# Patient Record
Sex: Female | Born: 1945 | Race: White | Hispanic: No | Marital: Single | Smoking: Former smoker
Health system: Southern US, Community
[De-identification: ages and names within clinical notes are randomized; demographics above are authoritative.]

## PROBLEM LIST (undated history)

## (undated) DIAGNOSIS — F329 Major depressive disorder, single episode, unspecified: Secondary | ICD-10-CM

## (undated) DIAGNOSIS — F32A Depression, unspecified: Secondary | ICD-10-CM

## (undated) DIAGNOSIS — J189 Pneumonia, unspecified organism: Secondary | ICD-10-CM

## (undated) DIAGNOSIS — G4730 Sleep apnea, unspecified: Secondary | ICD-10-CM

## (undated) DIAGNOSIS — I739 Peripheral vascular disease, unspecified: Secondary | ICD-10-CM

## (undated) DIAGNOSIS — C801 Malignant (primary) neoplasm, unspecified: Secondary | ICD-10-CM

## (undated) DIAGNOSIS — I639 Cerebral infarction, unspecified: Secondary | ICD-10-CM

## (undated) DIAGNOSIS — I10 Essential (primary) hypertension: Secondary | ICD-10-CM

## (undated) HISTORY — DX: Cerebral infarction, unspecified: I63.9

## (undated) HISTORY — DX: Essential (primary) hypertension: I10

---

## 1998-01-01 ENCOUNTER — Other Ambulatory Visit: Admission: RE | Admit: 1998-01-01 | Discharge: 1998-01-01 | Payer: Self-pay | Admitting: Obstetrics and Gynecology

## 1999-01-04 ENCOUNTER — Other Ambulatory Visit: Admission: RE | Admit: 1999-01-04 | Discharge: 1999-01-04 | Payer: Self-pay | Admitting: Obstetrics and Gynecology

## 2000-01-05 ENCOUNTER — Other Ambulatory Visit: Admission: RE | Admit: 2000-01-05 | Discharge: 2000-01-05 | Payer: Self-pay | Admitting: Obstetrics and Gynecology

## 2001-01-08 ENCOUNTER — Other Ambulatory Visit: Admission: RE | Admit: 2001-01-08 | Discharge: 2001-01-08 | Payer: Self-pay | Admitting: Obstetrics and Gynecology

## 2001-04-25 ENCOUNTER — Ambulatory Visit (HOSPITAL_COMMUNITY): Admission: RE | Admit: 2001-04-25 | Discharge: 2001-04-25 | Payer: Self-pay | Admitting: Gastroenterology

## 2001-04-25 ENCOUNTER — Encounter (INDEPENDENT_AMBULATORY_CARE_PROVIDER_SITE_OTHER): Payer: Self-pay | Admitting: Specialist

## 2001-06-28 ENCOUNTER — Ambulatory Visit: Admission: RE | Admit: 2001-06-28 | Discharge: 2001-06-28 | Payer: Self-pay | Admitting: Internal Medicine

## 2001-10-22 ENCOUNTER — Encounter: Payer: Self-pay | Admitting: Internal Medicine

## 2001-10-22 ENCOUNTER — Inpatient Hospital Stay (HOSPITAL_COMMUNITY): Admission: AD | Admit: 2001-10-22 | Discharge: 2001-10-23 | Payer: Self-pay | Admitting: Internal Medicine

## 2002-04-29 ENCOUNTER — Other Ambulatory Visit: Admission: RE | Admit: 2002-04-29 | Discharge: 2002-04-29 | Payer: Self-pay | Admitting: Obstetrics and Gynecology

## 2006-09-21 ENCOUNTER — Encounter: Admission: RE | Admit: 2006-09-21 | Discharge: 2006-09-21 | Payer: Self-pay | Admitting: Chiropractic Medicine

## 2006-09-28 ENCOUNTER — Encounter: Admission: RE | Admit: 2006-09-28 | Discharge: 2006-09-28 | Payer: Self-pay | Admitting: Internal Medicine

## 2009-09-02 ENCOUNTER — Ambulatory Visit: Payer: Self-pay | Admitting: Vascular Surgery

## 2009-09-07 ENCOUNTER — Ambulatory Visit (HOSPITAL_COMMUNITY): Admission: RE | Admit: 2009-09-07 | Discharge: 2009-09-07 | Payer: Self-pay | Admitting: Vascular Surgery

## 2009-09-07 ENCOUNTER — Ambulatory Visit: Payer: Self-pay | Admitting: Vascular Surgery

## 2009-10-07 ENCOUNTER — Ambulatory Visit: Payer: Self-pay | Admitting: Vascular Surgery

## 2010-04-02 LAB — POCT I-STAT, CHEM 8
BUN: 19 mg/dL (ref 6–23)
Calcium, Ion: 1.1 mmol/L — ABNORMAL LOW (ref 1.12–1.32)
Chloride: 102 mEq/L (ref 96–112)
Creatinine, Ser: 1.4 mg/dL — ABNORMAL HIGH (ref 0.4–1.2)
Glucose, Bld: 108 mg/dL — ABNORMAL HIGH (ref 70–99)
HCT: 38 % (ref 36.0–46.0)
Hemoglobin: 12.9 g/dL (ref 12.0–15.0)
Potassium: 2.8 mEq/L — ABNORMAL LOW (ref 3.5–5.1)
Sodium: 136 mEq/L (ref 135–145)
TCO2: 24 mmol/L (ref 0–100)

## 2010-04-14 ENCOUNTER — Encounter (INDEPENDENT_AMBULATORY_CARE_PROVIDER_SITE_OTHER): Payer: Medicare Other

## 2010-04-14 ENCOUNTER — Ambulatory Visit (INDEPENDENT_AMBULATORY_CARE_PROVIDER_SITE_OTHER): Payer: Medicare Other | Admitting: Vascular Surgery

## 2010-04-14 DIAGNOSIS — I70219 Atherosclerosis of native arteries of extremities with intermittent claudication, unspecified extremity: Secondary | ICD-10-CM

## 2010-04-14 DIAGNOSIS — I739 Peripheral vascular disease, unspecified: Secondary | ICD-10-CM

## 2010-04-15 NOTE — Assessment & Plan Note (Signed)
OFFICE VISIT  [name], [name] DOB:  1945/04/29                                       04/14/2010 [id number]#:[id number]  I saw this patient in the office today for continued follow-up of her peripheral vascular disease.  I had last seen her in September 2011. 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  The left iliac system is open but small.  She had no significant infrainguinal arterial occlusive disease.  We discussed the importance of tobacco cessation and structured walking program and she comes in for a routine follow-up visit.  Since I saw her last she is just back from [place] where she was there for 2 months.  She did a lot of walking in [place] and feels her symptoms have actually improved.  She experiences claudication mostly in the thighs, more so on the right side and to a lesser degree in the calves.  Her  symptoms were brought on by ambulation and relieved with rest.  There are no other aggravating or alleviating factors.  She has had no rest pain and no history of nonhealing wounds.  SOCIAL HISTORY:  She is single.  She does not have any children.  She is retired.  She smokes a pack per day of cigarettes and has been smoking for over 40 years.  She had cut back briefly to a half a pack per day but is now back to a pack.  REVIEW OF SYSTEMS:  CARDIOVASCULAR:  She has had no chest pain, chest pressure, palpitations or arrhythmias. PULMONARY:  She had no productive cough, bronchitis, asthma or wheezing.  PHYSICAL EXAMINATION:  General;  This is a pleasant 64-hour woman who appears her stated age.  Vital signs:  Blood pressure 156/77, heart rate is 74, saturation 99%.  Lungs:  Clear bilaterally to auscultation without rales, rhonchi or wheezing.  Cardiovascular exam:  I do not detect any carotid bruits.  She has a regular rate and rhythm.  I cannot palpate a  right femoral pulse.  She has a  diminished left femoral pulse.  She does have a palpable left dorsalis pedis pulse.  I cannot palpate pedal pulses on the right.  Neurologic exam: She has no focal weakness or paresthesias.  Skin:  There are no ulcers or rashes.  I have independently interpreted arterial Doppler study which shows monophasic Doppler signals on the right with an ABI of 56% which is stable compared to her study last year.  On the left side she has a biphasic posterior tibial and dorsalis pedis signal with an ABI of 99%.  I have ordered a follow-up Doppler study in 1 year and I will see her back at that time.  I have encouraged to stay as active as possible and to try do as much walking as possible.  We have again discussed the importance of tobacco cessation.  She knows to call sooner if she has problems.    Di Kindle. Edilia Bo, M.D. Electronically Signed  CSD/MEDQ  D:  04/14/2010  T:  04/15/2010  Job:  [id number]  cc:   Gwen Pounds, MD

## 2010-06-01 NOTE — Consult Note (Signed)
NEW PATIENT CONSULTATION   [name], [name]  DOB:  10/21/1945                                       09/02/2009  [id number]#:[id number]   I saw patient in the office today in consultation concerning her leg  pain.  She was referred by Dr. Timothy Lasso.   This is a pleasant 65 year old woman who began having pain in both legs  in June when she was on a trip to [place].  She noticed pain in the  thigh.  It was brought on by ambulation and relieved with rest.  These  symptoms have been stable since then.  There are no other aggravating or  alleviating factors.  She has no significant calf claudication, and she  denies any history of rest pain or nonhealing ulcers.  She denies any  significant back pain.  Of note, she has been on Lipitor and recently  stopped this.  Does feel that this did help her pain somewhat.  She does  not remember any acute onset of the pain, but it was more gradual in  onset.   As part of the workup for this, she was evaluated by Dr. Timothy Lasso and had a  lower extremity arterial duplex and abdominal aortic duplex at [facility], which I have reviewed.  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  Her ABI  on the right was 60% and on the left 90%.   Her past medical history is significant for hypertension and  hypercholesterolemia, both of which are followed closely by Dr. Timothy Lasso.  She denies any history of diabetes, history of previous myocardial  infarction, history of congestive heart failure, or history of COPD.   SOCIAL HISTORY:  She is single.  She does not have any children.  She is  retired.  She smokes a pack per day of cigarettes and has been smoking  for 40 years.   FAMILY HISTORY:  Her mother had colon cancer.  Brother had prostate  cancer.  Her father had a stroke at age 30.  She is unaware of any  history of  premature cardiovascular disease.   REVIEW OF SYSTEMS:  GENERAL:  She has had no recent weight loss, weight  gain, or problems with her appetite.  CARDIOVASCULAR:  She has had no chest pain, chest pressure, palpitations  or arrhythmias.  She has no significant dyspnea on exertion.  She has  had no history of stroke, TIAs, or amaurosis fugax.  She has had no  history of DVT or phlebitis.  PULMONARY:  She has had bronchitis in the past.  PSYCHIATRIC:  She has had depression in the past.  GI, neurologic, hematologic, GU, ENT, musculoskeletal, integumentary  review of systems is unremarkable, as documented on the medical history  form in her chart.   PHYSICAL EXAMINATION:  This is a pleasant 65 year old woman who appears  her stated age.  Blood pressure is 119/71.  Heart rate is 75, saturation  99%.  HEENT:  Unremarkable.  Lungs are clear bilaterally to auscultation  without rales, rhonchi or wheezing.  On cardiovascular exam, she has an  irregular rate and rhythm.  I do not detect any carotid bruits.  I  cannot palpate a right femoral pulse.  I  cannot palpate a right  popliteal or pedal pulses on the right.  On the left side, she has a  palpable femoral, popliteal, dorsalis pedis, and posterior tibial pulse.  She has no significant lower extremity swelling.  Abdomen is soft and  nontender with normal-pitched bowel sounds.  No masses are appreciated.  I cannot appreciate an abdominal aortic aneurysm.  Musculoskeletal exam  has no major deformities or cyanosis.  Neurologic:  She has no focal  weakness or paresthesias.  Skin:  There are no ulcers or rashes.   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  We have discussed the  importance of tobacco cessation in the office today, and I have given  her the number for [facility] free program.  We have also discussed the  importance of a structured walking program.  We also  discussed the  option of proceeding with arteriography to see if she has any disease  amenable to angioplasty.  She would like to proceed with arteriography  to further evaluate this blockage, and we have reviewed the indications  for arteriography and the potential complications, including but not  limited to bleeding, arterial injury, and renal insufficiency.  We have  also discussed the indications for possible angioplasty and stenting and  the potential complications including but not limited to bleeding,  arterial injury or arterial thrombosis.  All of her questions were  answered, and she is agreeable to proceed.  The procedure has been  scheduled for 09/07/2009.  We will make further recommendations pending  these results.     Di Kindle. Edilia Bo, M.D.  Electronically Signed   CSD/MEDQ  D:  09/02/2009  T:  09/02/2009  Job:  [id number]   cc:   Gwen Pounds, MD

## 2010-06-01 NOTE — Assessment & Plan Note (Signed)
OFFICE VISIT   [name], [name]  DOB:  [birth year]                                       10/07/2009  EAVWU#:[id number]   I saw the patient in the office today for continued followup of her  peripheral vascular disease.  I had initially seen her in consultation  on September 02, 2009.  She had evidence of iliac artery occlusive disease  on the right and underwent a diagnostic arteriogram on September 07, 2009.  This showed a long-segment occlusion of her right external iliac artery  with diffuse calcific disease.  She was not a candidate for endovascular  intervention on the right.  Her iliac system on the left was patent  although the artery was somewhat small.  She comes in for a followup  visit.  She continues to have claudication in the right leg that mostly  involves her right thigh.  This is brought on by ambulation and relieved  with rest.  She has had no rest pain and no history of nonhealing  ulcers.  She has some buttock pain on the left although this sounds to  be unrelated to her peripheral vascular disease and may simply be  bursitis.   REVIEW OF SYSTEMS:  CARDIOVASCULAR:  She has had no chest pain, chest  pressure, palpitations or arrhythmias.   PHYSICAL EXAMINATION:  This is a pleasant 65 year old woman who appears  her stated age.  Blood pressure is 136/78, heart rate is 75, respiratory  rate is 16.  Lungs are clear bilaterally to auscultation.  Cardiovascular exam:  She has a regular rate and rhythm.  I cannot  palpate a right femoral pulse.  She has a palpable left femoral pulse.  There are no palpable pulses on the right, although the right foot  appears adequately perfused.  On the left side she has palpable pedal  pulses.   I reviewed the results of her arteriogram and we discussed the option of  conservative treatment with a structured walking program and tobacco  cessation versus considering left-to-right fem-fem bypass graft.  I  would favor conservative therapy for now unless her symptoms progress  significantly.  She agrees with that assessment and I will plan on  seeing her back in 6 months.  I have ordered followup ABIs at that time.  She knows to call sooner if she has problems.     Di Kindle. Edilia Bo, M.D.  Electronically Signed   CSD/MEDQ  D:  10/07/2009  T:  10/08/2009  Job:  [id number]   cc:   Gwen Pounds, MD

## 2010-06-04 NOTE — Discharge Summary (Signed)
   NAME:  [name], [name]                     ACCOUNT NO.:  [id number]   MEDICAL RECORD NO.:  [id number]                   PATIENT TYPE:  INP   LOCATION:  5530                                 FACILITY:  [facility]   PHYSICIAN:  Gwen Pounds, M.D.                 DATE OF BIRTH:  30-Oct-1945   DATE OF ADMISSION:  10/22/2001  DATE OF DISCHARGE:  10/23/2001                                 DISCHARGE SUMMARY   DISCHARGE DIAGNOSES:  1. Chronic obstructive pulmonary disease exacerbation.  2. Intractable cough.  3. Hypertension.  4. Hyperlipidemia.  5. Tobacco abuse.  6. Former alcoholic.   DISCHARGE MEDICATIONS:  Discharge medications include home medications plus:  1. Tequin 400 mg p.o. every day x14 days.  2. Prednisone taper.  3. Atrovent as needed.   PROCEDURE:  Chest x-ray, which showed no evidence of pneumonia.   HISTORY OF PRESENT ILLNESS:  Briefly, a 65 year old white female that  smokes, with underlying COPD, presented to medical attention on October 22, 2001 after failing outpatient treatment for COPD exacerbation with  prednisone and Z-Pak. In the office, she had fever, rib pain, cough, sinus  congestion and shortness of breath, saturating 80-90%, and it was felt that  she needed to be admitted for further tune-up.   HOSPITAL COURSE:  She was given IV Solu-Medrol, given IV Tequin; both of  these were converted over to oral prior to discharge.  She was given  nebulizer treatments, given oxygen and she improved overnight to the point  where she can go back home and finish her outpatient course with Tequin and  much longer tapering prednisone.   The patient can be discharged on October 23, 2001 in stable condition.   AFTER-CARE FOLLOWUP INSTRUCTIONS:  She will follow up with me next Monday as  scheduled and she is strongly encouraged to quit tobacco.                                               Gwen Pounds, M.D.    JMR/MEDQ  D:  10/23/2001  T:  10/26/2001  Job:   [id number]

## 2010-06-04 NOTE — Procedures (Signed)
[facility]  Patient:    [name], [name] Visit Number: [id number] MRN: [id number]          Service Type: END Location: ENDO Attending Physician:  Rich Brave Dictated by:   Florencia Reasons, M.D. Proc. Date: 04/25/01 Admit Date:  04/25/2001   CC:         Gwen Pounds, M.D.   Procedure Report  PROCEDURE:  Colonoscopy with biopsies.  INDICATION:  A 65 year old female for follow-up of prior history of adenomatous polyps, and a family history of colon cancer in her mother diagnosed around age 73, with the patients most recent surveillance colonoscopy, five and a half years ago, having shown just a small adenomatous polyp.  FINDINGS:  Several small polyps removed.  DESCRIPTION OF PROCEDURE:  The nature, purpose, and risks of the procedure were familiar to the patient from prior examinations, and she provided written consent.  Sedation was begun with a bolus of 5 mg of Versed in view of a known previous fairly heavy sedation requirement in this patient.  After that, we touched her up to a total of fentanyl 75 mcg and Versed 7 mg during the course of the procedure without arrhythmias, desaturation, or other problems.  The level of sedation was good, and the patient was comfortable throughout.  The Olympus adjustable-tension pediatric video colonoscope was advanced through a spastic sigmoid region quite easily around the colon to the cecum, this identified by clear visualization of the appendiceal orifice, after which pullback was performed.  A total of approximately 4-5 small polyps were removed during this exam, each by 1-2 cold biopsies, and the largest of these was probably 3 mm across.  All of the polyps were small, sessile, and hyperplastic in appearance.  The first was in the cecum; then there were a couple in roughly 30 cm and 40 cm, and then one at 15 cm and one in the distal rectum.  There was good hemostasis at the biopsy  sites.  No large polyps, cancer, colitis, vascular malformations, or diverticular disease were appreciated.  The quality of the prep was quite good, so it is felt that all areas were adequately seen.  Retroflexion was not performed in the rectum due to the proximity of the rectal polyp biopsies.  Reinspection of the rectosigmoid and careful antegrade inspection of the distal rectum were unremarkable.  The patient tolerated the procedure well, and there were no apparent complications.  IMPRESSION:  Approximately five small sessile polyps removed as described above.  PLAN:  Await pathology. Dictated by:   Florencia Reasons, M.D. Attending Physician:  Rich Brave DD:  04/25/01 TD:  04/25/01 Job: [id number]

## 2010-06-04 NOTE — H&P (Signed)
NAME:  [name], [name] (FLASH) F             ACCOUNT NO.:  [id number]   MEDICAL RECORD NO.:  [id number]                   PATIENT TYPE:  INP   LOCATION:  5530                                 FACILITY:  [facility]   PHYSICIAN:  Gwen Pounds, M.D.                 DATE OF BIRTH:  18-Mar-1945   DATE OF ADMISSION:  10/22/2001  DATE OF DISCHARGE:                                HISTORY & PHYSICAL   PULMONOLOGIST:  Clinton D. Young, M.D.   CHIEF COMPLAINT:  Cough, shortness of breath, and fever.   HISTORY OF PRESENT ILLNESS:  This is a 65 year old female with hypertension,  hyperlipidemia, positive tobacco being admitted with two weeks of COPD  symptoms unresponsive to prednisone and Z-pack.  She is now with fever, rib  pain, cough, sinus congestion and shortness of breath.  Saturations are in  the upper 80s and 90s on room air.  Will admit for further evaluation and  treatment.   PAST MEDICAL HISTORY:  1. Hypertension.  2. Hyperlipidemia.  3. Significant tobacco.  4. Former alcoholic.  5. COPD with FEV1 to FVC ratio of 69% and FEF 2575 of 44%.  These tests were     done on spirometry dated June 28, 2001, and diagnosed with mild to     moderate obstructive lung disease.  6. She had breast surgery and nipple resection.  7. She had history of broken ribs secondary to pneumonitis.  8. She had polio as a child.  9. Adenomatous polypectomy.   ALLERGIES:  BETA-BLOCKERS and PERCOCET.   MEDICATIONS:  1. Teveten 600 q.d.  2. Clonidine 0.1 two q.h.s.  3. Pravachol 40 q.h.s.  4. Sonata 10 one to two q.h.s.  5. Lexapro 10 q.d.  6. Hydrochlorothiazide 12.5 q.d.   SOCIAL HISTORY:  She is single with no children.  She is a retired Runner, broadcasting/film/video.  She smokes one pack per day x36 years.  She is no longer drinking alcohol.  Her health care power of attorney is Benay Spice.   FAMILY HISTORY:  CVA, colon cancer, hypertension.   REVIEW OF SYSTEMS:  She has been having fevers, been having chills,  been  having night sweats.  Her weight is a little bit lower.  No lymphadenopathy.  Positive headaches.  Positive sinus issues.  Mild photophobia. No skin  lesions.  No chest pain.  No resting shortness of breath, although becomes  dyspnea on exertion.  She denies orthopnea.  No PND, no edema. No  genitourinary symptoms.  No neuropsychiatric symptoms currently.  No  musculoskeletal symptoms.  Denies nausea, vomiting, diarrhea, blood in  stool, and all other organ systems reviewed negative.  She did have a chest  x-ray on October 16, 2001, which was negative.   PHYSICAL EXAMINATION:  GENERAL APPEARANCE:  Alert and oriented x3.  VITAL SIGNS:  Temperature 99.9, pulse 96, weight 122, 89 to 90% saturations  on room air, blood pressure 134/88.  HEENT:  Sinus pressure noted. Pupils  are equal, round and reactive to light.  Extraocular movements intact.  Oropharynx is dry.  NECK:  Supple without lymphadenopathy.  LUNGS:  Clear except for diffuse wheezing.  CARDIOVASCULAR:  Regular without murmurs.  SKIN:  No rashes.  ABDOMEN:  Soft and nontender.  EXTREMITIES:  No clubbing, cyanosis, or edema.  NEUROLOGIC:  Grossly intact.   ASSESSMENT:  This is a 65 year old female with history of emphysema being  admitted with chronic obstructive pulmonary disease exacerbation, rule out  pneumonia.  She also has hypoxia.   PLAN:  1. Admit.  2. Oxygen, antibiotics, nebulizer and steroids.  3. Cough suppression.  4. Chest x-ray PA and lateral.  5. Questionable rib fracture.  6. Check labs.  7. Control blood pressure.   Of note, she has had past work-up for her emphysema includes CT scan by the  national lung screen trial.  She did have mild emphysema in the upper lobes  on April 23, 2001.  She has had three spirometry examinations, the first one  was in November 1996 which showed an FVC of 2.54 and FEV1 of 1.94, ratio was  at 76%.  It was repeated on April 20, 1995, and there really was no change   with an FVC of 2.53 and FEV1 of 1.97, again the ratio was 78%.  When I  rechecked in on June 28, 2001, her FVC had gone up to 2.68 and her FEV1 had  gone down slightly to 1.86 for a ratio of 69%.  Where she had a lot of  trouble with though is when we performed DLCO where she was significantly  reduced at 53%.  She also had a marked decrease in her FEF 25-75% time at  1.04 liters per second which is 44% of predicted.  She had no change in any  of her parameters of methylcholine. The official read on the June 28, 2001,  was  there was a moderate obstructive lung defect. The airway obstruction is  confirmed by the decrease in flow rate of 25%, 50% and 75% of the flow  volume curve.  Lung volumes ware within normal limits.  There is a moderate  decrease in diffusing capacity.  This is interpreted as an insignificant  response to the bronchodilator.                                                Gwen Pounds, M.D.    JMR/MEDQ  D:  10/22/2001  T:  10/24/2001  Job:  [id number]   cc:   Joni Fears D. Young, M.D.  [address] [place]  [place] [address]  Fax: [phone]

## 2010-08-03 ENCOUNTER — Other Ambulatory Visit: Payer: Self-pay | Admitting: Dermatology

## 2011-03-14 ENCOUNTER — Other Ambulatory Visit: Payer: Self-pay | Admitting: Gastroenterology

## 2011-03-24 ENCOUNTER — Other Ambulatory Visit: Payer: Self-pay | Admitting: Dermatology

## 2011-05-06 ENCOUNTER — Ambulatory Visit (INDEPENDENT_AMBULATORY_CARE_PROVIDER_SITE_OTHER): Payer: Medicare Other | Admitting: *Deleted

## 2011-05-06 DIAGNOSIS — I739 Peripheral vascular disease, unspecified: Secondary | ICD-10-CM

## 2011-05-19 ENCOUNTER — Encounter (HOSPITAL_COMMUNITY): Payer: Self-pay | Admitting: Emergency Medicine

## 2011-05-19 ENCOUNTER — Other Ambulatory Visit: Payer: Self-pay | Admitting: *Deleted

## 2011-05-19 ENCOUNTER — Emergency Department (HOSPITAL_COMMUNITY)
Admission: EM | Admit: 2011-05-19 | Discharge: 2011-05-19 | Discharge status: Against Medical Advice | Payer: Medicare Other | Attending: Emergency Medicine | Admitting: Emergency Medicine

## 2011-05-19 DIAGNOSIS — I10 Essential (primary) hypertension: Secondary | ICD-10-CM | POA: Insufficient documentation

## 2011-05-19 DIAGNOSIS — I739 Peripheral vascular disease, unspecified: Secondary | ICD-10-CM

## 2011-05-19 HISTORY — DX: Depression, unspecified: F32.A

## 2011-05-19 HISTORY — DX: Major depressive disorder, single episode, unspecified: F32.9

## 2011-05-19 NOTE — ED Notes (Signed)
States pain in back on Monday todau bp is up and she feels cannot get enough air and she starts to cough had night sweats took some pcn that she had for the cough

## 2011-05-19 NOTE — ED Notes (Signed)
Pt stating she doesn't want to wait to see a doctor any longer. Will follow up with her doctor in the office.

## 2011-05-20 ENCOUNTER — Encounter: Payer: Self-pay | Admitting: Vascular Surgery

## 2011-09-22 ENCOUNTER — Other Ambulatory Visit: Payer: Self-pay | Admitting: Dermatology

## 2012-01-18 HISTORY — PX: KNEE SURGERY: SHX244

## 2012-04-25 ENCOUNTER — Ambulatory Visit: Payer: Medicare Other | Admitting: Neurosurgery

## 2012-05-22 ENCOUNTER — Encounter: Payer: Self-pay | Admitting: Vascular Surgery

## 2012-05-23 ENCOUNTER — Ambulatory Visit (INDEPENDENT_AMBULATORY_CARE_PROVIDER_SITE_OTHER): Payer: Medicare Other | Admitting: Vascular Surgery

## 2012-05-23 ENCOUNTER — Encounter (INDEPENDENT_AMBULATORY_CARE_PROVIDER_SITE_OTHER): Payer: Medicare Other | Admitting: *Deleted

## 2012-05-23 ENCOUNTER — Encounter: Payer: Self-pay | Admitting: Vascular Surgery

## 2012-05-23 VITALS — BP 139/84 | HR 90 | Resp 14 | Ht 60.0 in | Wt 98.0 lb

## 2012-05-23 DIAGNOSIS — I739 Peripheral vascular disease, unspecified: Secondary | ICD-10-CM | POA: Insufficient documentation

## 2012-05-23 NOTE — Progress Notes (Signed)
[facility]  Patient name: [name] MRN: [id number] DOB: 11/28/1945 Sex: female  REASON FOR VISIT: follow up of peripheral vascular disease.  HPI: NOVIS [name] is a 67 y.o. female who I last saw in March of 2012. He has a known long segment occlusion of her right external iliac artery. Left iliac system is open but she has small calcified arteries. She comes in for a yearly follow up visit. She states that she is essentially asymptomatic on the right. She denies claudication, rest pain, or nonhealing ulcers. He has had no symptoms on the left side.  REVIEW OF SYSTEMS: Arly.Keller ] denotes positive finding; [  ] denotes negative finding  CARDIOVASCULAR:  [ ]  chest pain   [ ]  dyspnea on exertion    CONSTITUTIONAL:  [ ]  fever   [ ]  chills  PHYSICAL EXAM: Filed Vitals:   05/23/12 1209  BP: 139/84  Pulse: 90  Resp: 14  Height: 5' (1.524 m)  Weight: 98 lb (44.453 kg)  SpO2: 100%   Body mass index is 19.14 kg/(m^2). GENERAL: The patient is a well-nourished female, in no acute distress. The vital signs are documented above. CARDIOVASCULAR: There is a regular rate and rhythm. He has a palpable left femoral pulse. I cannot palpate a right femoral pulse. I cannot palpate pedal pulses. However, both feet are warm and well-perfused. PULMONARY: There is good air exchange bilaterally without wheezing or rales.  I have independently interpreted her arterial Doppler study which shows an ABI of 55% on the right with a monophasic dorsalis pedis and posterior tibial signals. ABI on the left is 90%.  MEDICAL ISSUES:  Peripheral vascular disease, unspecified This patient has a known right external iliac artery occlusion. She comes in for a routine follow up visit in her symptoms have remained stable. We have again discussed the importance of tobacco cessation. In addition we have discussed the importance of staying as active as possible. Would not recommend any  further vascular workup and the she developed a nonhealing wound or limb threatening ischemia. I will see her back as needed.   Shawnice Tilmon S [facility] Beeper: [phone]

## 2012-05-23 NOTE — Assessment & Plan Note (Signed)
This patient has a known right external iliac artery occlusion. She comes in for a routine follow up visit in her symptoms have remained stable. We have again discussed the importance of tobacco cessation. In addition we have discussed the importance of staying as active as possible. Would not recommend any further vascular workup and the she developed a nonhealing wound or limb threatening ischemia. I will see her back as needed.

## 2012-09-21 ENCOUNTER — Ambulatory Visit: Payer: Medicare Other | Attending: Internal Medicine | Admitting: Rehabilitative and Restorative Service Providers"

## 2012-09-21 DIAGNOSIS — IMO0001 Reserved for inherently not codable concepts without codable children: Secondary | ICD-10-CM | POA: Insufficient documentation

## 2012-09-21 DIAGNOSIS — R209 Unspecified disturbances of skin sensation: Secondary | ICD-10-CM | POA: Insufficient documentation

## 2012-09-21 DIAGNOSIS — M25559 Pain in unspecified hip: Secondary | ICD-10-CM | POA: Insufficient documentation

## 2012-09-21 DIAGNOSIS — R269 Unspecified abnormalities of gait and mobility: Secondary | ICD-10-CM | POA: Insufficient documentation

## 2012-09-28 ENCOUNTER — Ambulatory Visit: Payer: Medicare Other | Admitting: Rehabilitative and Restorative Service Providers"

## 2012-10-05 ENCOUNTER — Ambulatory Visit: Payer: Medicare Other | Admitting: Rehabilitative and Restorative Service Providers"

## 2014-01-17 HISTORY — PX: EYE SURGERY: SHX253

## 2014-01-23 ENCOUNTER — Other Ambulatory Visit: Payer: Self-pay | Admitting: Ophthalmology

## 2014-02-28 ENCOUNTER — Inpatient Hospital Stay (HOSPITAL_COMMUNITY): Payer: Medicare Other | Admitting: Certified Registered Nurse Anesthetist

## 2014-02-28 ENCOUNTER — Ambulatory Visit (HOSPITAL_COMMUNITY)
Admission: AD | Admit: 2014-02-28 | Discharge: 2014-02-28 | Disposition: A | Discharge status: Home / Self Care | Payer: Medicare Other | Source: Ambulatory Visit | Attending: Ophthalmology | Admitting: Ophthalmology

## 2014-02-28 ENCOUNTER — Encounter (HOSPITAL_COMMUNITY)
Admission: AD | Disposition: A | Discharge status: Home / Self Care | Payer: Self-pay | Source: Ambulatory Visit | Attending: Ophthalmology

## 2014-02-28 ENCOUNTER — Ambulatory Visit: Payer: Self-pay | Admitting: Ophthalmology

## 2014-02-28 ENCOUNTER — Other Ambulatory Visit: Payer: Self-pay | Admitting: Ophthalmology

## 2014-02-28 ENCOUNTER — Encounter: Payer: Self-pay | Admitting: Ophthalmology

## 2014-02-28 DIAGNOSIS — J45909 Unspecified asthma, uncomplicated: Secondary | ICD-10-CM | POA: Insufficient documentation

## 2014-02-28 DIAGNOSIS — I739 Peripheral vascular disease, unspecified: Secondary | ICD-10-CM | POA: Insufficient documentation

## 2014-02-28 DIAGNOSIS — F329 Major depressive disorder, single episode, unspecified: Secondary | ICD-10-CM | POA: Insufficient documentation

## 2014-02-28 DIAGNOSIS — H44001 Unspecified purulent endophthalmitis, right eye: Secondary | ICD-10-CM | POA: Insufficient documentation

## 2014-02-28 DIAGNOSIS — I10 Essential (primary) hypertension: Secondary | ICD-10-CM | POA: Insufficient documentation

## 2014-02-28 HISTORY — PX: PARS PLANA VITRECTOMY: SHX2166

## 2014-02-28 HISTORY — DX: Sleep apnea, unspecified: G47.30

## 2014-02-28 LAB — CBC
HCT: 38.6 % (ref 36.0–46.0)
Hemoglobin: 12.7 g/dL (ref 12.0–15.0)
MCH: 30 pg (ref 26.0–34.0)
MCHC: 32.9 g/dL (ref 30.0–36.0)
MCV: 91 fL (ref 78.0–100.0)
Platelets: 250 10*3/uL (ref 150–400)
RBC: 4.24 MIL/uL (ref 3.87–5.11)
RDW: 13.7 % (ref 11.5–15.5)
WBC: 8.1 10*3/uL (ref 4.0–10.5)

## 2014-02-28 LAB — GRAM STAIN

## 2014-02-28 LAB — BASIC METABOLIC PANEL
ANION GAP: 12 (ref 5–15)
BUN: 17 mg/dL (ref 6–23)
CHLORIDE: 105 mmol/L (ref 96–112)
CO2: 20 mmol/L (ref 19–32)
CREATININE: 0.83 mg/dL (ref 0.50–1.10)
Calcium: 9.3 mg/dL (ref 8.4–10.5)
GFR calc non Af Amer: 70 mL/min — ABNORMAL LOW (ref >90)
GFR, EST AFRICAN AMERICAN: 82 mL/min — AB (ref >90)
Glucose, Bld: 87 mg/dL (ref 70–99)
POTASSIUM: 4.1 mmol/L (ref 3.5–5.1)
Sodium: 137 mmol/L (ref 135–145)

## 2014-02-28 SURGERY — PARS PLANA VITRECTOMY 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
Anesthesia: General | Site: Eye | Laterality: Right

## 2014-02-28 SURGERY — PARS PLANA VITRECTOMY 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
Anesthesia: Monitor Anesthesia Care | Laterality: Right

## 2014-02-28 MED ORDER — ACETAMINOPHEN 325 MG PO TABS: 325.0000 mg | ORAL_TABLET | ORAL | Status: DC | PRN

## 2014-02-28 MED ORDER — GATIFLOXACIN 0.5 % OP SOLN
OPHTHALMIC | Status: AC
  Administered 2014-02-28: 1 [drp] via OPHTHALMIC
  Filled 2014-02-28: qty 2.5

## 2014-02-28 MED ORDER — ACETAMINOPHEN 160 MG/5ML PO SOLN
325.0000 mg | ORAL | Status: DC | PRN
  Filled 2014-02-28: qty 20.3

## 2014-02-28 MED ORDER — FENTANYL CITRATE 0.05 MG/ML IJ SOLN
INTRAMUSCULAR | Status: DC | PRN
  Administered 2014-02-28: 25 ug via INTRAVENOUS

## 2014-02-28 MED ORDER — GLYCOPYRROLATE 0.2 MG/ML IJ SOLN
INTRAMUSCULAR | Status: AC
  Filled 2014-02-28: qty 1

## 2014-02-28 MED ORDER — OXYCODONE HCL 5 MG PO TABS: 5.0000 mg | ORAL_TABLET | Freq: Once | ORAL | Status: DC | PRN

## 2014-02-28 MED ORDER — LIDOCAINE HCL 2 % IJ SOLN
INTRAMUSCULAR | Status: AC
  Filled 2014-02-28: qty 20

## 2014-02-28 MED ORDER — PHENYLEPHRINE HCL 2.5 % OP SOLN
OPHTHALMIC | Status: AC
Start: 2014-02-28 — End: 2014-02-28
  Administered 2014-02-28: 1 [drp] via OPHTHALMIC
  Filled 2014-02-28: qty 2

## 2014-02-28 MED ORDER — ONDANSETRON HCL 4 MG/2ML IJ SOLN
INTRAMUSCULAR | Status: DC | PRN
  Administered 2014-02-28: 4 mg via INTRAVENOUS

## 2014-02-28 MED ORDER — OXYCODONE HCL 5 MG/5ML PO SOLN: 5.0000 mg | Freq: Once | ORAL | Status: DC | PRN

## 2014-02-28 MED ORDER — FENTANYL CITRATE 0.05 MG/ML IJ SOLN
INTRAMUSCULAR | Status: AC
  Filled 2014-02-28: qty 5

## 2014-02-28 MED ORDER — FENTANYL CITRATE 0.05 MG/ML IJ SOLN: 25.0000 ug | INTRAMUSCULAR | Status: DC | PRN

## 2014-02-28 MED ORDER — BSS PLUS IO SOLN
INTRAOCULAR | Status: AC
  Filled 2014-02-28: qty 500

## 2014-02-28 MED ORDER — HYPROMELLOSE (GONIOSCOPIC) 2.5 % OP SOLN
OPHTHALMIC | Status: AC
  Filled 2014-02-28: qty 15

## 2014-02-28 MED ORDER — LIDOCAINE HCL (CARDIAC) 20 MG/ML IV SOLN
INTRAVENOUS | Status: DC | PRN
  Administered 2014-02-28: 40 mg via INTRAVENOUS

## 2014-02-28 MED ORDER — BSS IO SOLN
INTRAOCULAR | Status: DC | PRN
  Administered 2014-02-28: 15 mL via INTRAOCULAR

## 2014-02-28 MED ORDER — VANCOMYCIN INTRAVITREAL INJECTION 1 MG/0.1 ML
INTRAOCULAR | Status: DC | PRN
  Administered 2014-02-28: 1 mg via INTRAVITREAL

## 2014-02-28 MED ORDER — CYCLOPENTOLATE HCL 1 % OP SOLN
OPHTHALMIC | Status: AC
Start: 2014-02-28 — End: 2014-02-28
  Administered 2014-02-28: 1 [drp] via OPHTHALMIC
  Filled 2014-02-28: qty 2

## 2014-02-28 MED ORDER — NA CHONDROIT SULF-NA HYALURON 40-30 MG/ML IO SOLN
INTRAOCULAR | Status: AC
  Filled 2014-02-28: qty 0.5

## 2014-02-28 MED ORDER — VANCOMYCIN 10 MG/ML OPHTHALMIC INJECTION
10.0000 mg | INJECTION | Freq: Once | Status: AC
  Administered 2014-02-28: .3 mL via SUBCONJUNCTIVAL
  Filled 2014-02-28: qty 10

## 2014-02-28 MED ORDER — DEXAMETHASONE SODIUM PHOSPHATE 10 MG/ML IJ SOLN
INTRAMUSCULAR | Status: AC
  Filled 2014-02-28: qty 1

## 2014-02-28 MED ORDER — TETRACAINE HCL 0.5 % OP SOLN
OPHTHALMIC | Status: AC
  Filled 2014-02-28: qty 2

## 2014-02-28 MED ORDER — BSS IO SOLN
INTRAOCULAR | Status: AC
  Filled 2014-02-28: qty 15

## 2014-02-28 MED ORDER — CEFTAZIDIME INTRAVITREAL INJECTION 2.25 MG/0.1 ML
2.2500 mg | INTRAVITREAL | Status: AC
  Administered 2014-02-28: 2.25 mg via INTRAVITREAL
  Filled 2014-02-28: qty 0.1

## 2014-02-28 MED ORDER — VANCOMYCIN HCL IN DEXTROSE 1-5 GM/200ML-% IV SOLN
1000.0000 mg | INTRAVENOUS | Status: AC
  Administered 2014-02-28: 1000 mg via INTRAVENOUS
  Filled 2014-02-28: qty 200

## 2014-02-28 MED ORDER — NA CHONDROIT SULF-NA HYALURON 40-30 MG/ML IO SOLN
INTRAOCULAR | Status: DC | PRN
  Administered 2014-02-28: 0.5 mL via INTRAOCULAR

## 2014-02-28 MED ORDER — GATIFLOXACIN 0.5 % OP SOLN
1.0000 [drp] | OPHTHALMIC | Status: AC | PRN
  Administered 2014-02-28 (×3): 1 [drp] via OPHTHALMIC

## 2014-02-28 MED ORDER — DEXTROSE 5 % IV SOLN
1.0000 g | INTRAVENOUS | Status: AC
  Administered 2014-02-28: 1 g via INTRAVENOUS
  Filled 2014-02-28: qty 1

## 2014-02-28 MED ORDER — BSS PLUS IO SOLN
INTRAOCULAR | Status: DC | PRN
  Administered 2014-02-28: 16:00:00

## 2014-02-28 MED ORDER — CYCLOPENTOLATE HCL 1 % OP SOLN
1.0000 [drp] | OPHTHALMIC | Status: AC
  Administered 2014-02-28 (×3): 1 [drp] via OPHTHALMIC

## 2014-02-28 MED ORDER — DEXAMETHASONE SODIUM PHOSPHATE 10 MG/ML IJ SOLN
INTRAMUSCULAR | Status: DC | PRN
  Administered 2014-02-28: .5 mL

## 2014-02-28 MED ORDER — MIDAZOLAM HCL 5 MG/5ML IJ SOLN
INTRAMUSCULAR | Status: DC | PRN
  Administered 2014-02-28: 2 mg via INTRAVENOUS

## 2014-02-28 MED ORDER — LACTATED RINGERS IV SOLN
INTRAVENOUS | Status: DC | PRN
  Administered 2014-02-28: 14:00:00 via INTRAVENOUS

## 2014-02-28 MED ORDER — SODIUM HYALURONATE 10 MG/ML IO SOLN
INTRAOCULAR | Status: AC
  Filled 2014-02-28: qty 0.85

## 2014-02-28 MED ORDER — CEFTAZIDIME INTRAVITREAL INJECTION 2.25 MG/0.1 ML
INTRAVITREAL | Status: DC | PRN
  Administered 2014-02-28: 2.25 mg via INTRAVITREAL

## 2014-02-28 MED ORDER — PHENYLEPHRINE HCL 2.5 % OP SOLN
1.0000 [drp] | OPHTHALMIC | Status: AC
  Administered 2014-02-28 (×3): 1 [drp] via OPHTHALMIC

## 2014-02-28 MED ORDER — PROPOFOL 10 MG/ML IV BOLUS
INTRAVENOUS | Status: DC | PRN
  Administered 2014-02-28: 30 mg via INTRAVENOUS

## 2014-02-28 MED ORDER — MIDAZOLAM HCL 2 MG/2ML IJ SOLN
INTRAMUSCULAR | Status: AC
  Filled 2014-02-28: qty 2

## 2014-02-28 MED ORDER — VANCOMYCIN HCL IN DEXTROSE 1-5 GM/200ML-% IV SOLN
INTRAVENOUS | Status: AC
  Filled 2014-02-28: qty 200

## 2014-02-28 MED ORDER — EPINEPHRINE HCL 1 MG/ML IJ SOLN
INTRAMUSCULAR | Status: AC
  Filled 2014-02-28: qty 1

## 2014-02-28 MED ORDER — CYCLOPENTOLATE-PHENYLEPHRINE OP SOLN OPTIME - NO CHARGE
OPHTHALMIC | Status: AC
  Filled 2014-02-28: qty 2

## 2014-02-28 MED ORDER — LIDOCAINE HCL 2 % IJ SOLN
INTRAMUSCULAR | Status: DC | PRN
  Administered 2014-02-28: 10 mL

## 2014-02-28 SURGICAL SUPPLY — 36 items
APPLICATOR COTTON TIP 6IN STRL (MISCELLANEOUS) ×9 IMPLANT
APPLICATOR DR MATTHEWS STRL (MISCELLANEOUS) ×3 IMPLANT
CANNULA ANT CHAM MAIN (OPHTHALMIC RELATED) IMPLANT
CANNULA FLEX TIP 25G (CANNULA) ×3 IMPLANT
CANNULA VLV SOFT TIP 25GA (OPHTHALMIC) ×6 IMPLANT
CORDS BIPOLAR (ELECTRODE) IMPLANT
COVER MAYO STAND STRL (DRAPES) ×3 IMPLANT
DRAPE OPHTHALMIC 77X100 STRL (CUSTOM PROCEDURE TRAY) ×3 IMPLANT
FILTER BLUE MILLIPORE (MISCELLANEOUS) IMPLANT
FILTER STRAW FLUID ASPIR (MISCELLANEOUS) IMPLANT
FORCEPS ECKARDT ILM 25G SERR (OPHTHALMIC RELATED) ×3 IMPLANT
GLOVE SS BIOGEL STRL SZ 8.5 (GLOVE) ×1 IMPLANT
GLOVE SUPERSENSE BIOGEL SZ 8.5 (GLOVE) ×2
GOWN STRL REUS W/ TWL LRG LVL3 (GOWN DISPOSABLE) ×1 IMPLANT
GOWN STRL REUS W/TWL LRG LVL3 (GOWN DISPOSABLE) ×2
KIT BASIN OR (CUSTOM PROCEDURE TRAY) ×3 IMPLANT
KIT ROOM TURNOVER OR (KITS) ×3 IMPLANT
LENS BIOM SUPER VIEW SET DISP (OPHTHALMIC RELATED) ×3 IMPLANT
NEEDLE 18GX1X1/2 (RX/OR ONLY) (NEEDLE) ×12 IMPLANT
NEEDLE 25GX 5/8IN NON SAFETY (NEEDLE) ×3 IMPLANT
NEEDLE HYPO 30X.5 LL (NEEDLE) ×6 IMPLANT
PACK VITRECTOMY CUSTOM (CUSTOM PROCEDURE TRAY) ×3 IMPLANT
PAD ARMBOARD 7.5X6 YLW CONV (MISCELLANEOUS) ×3 IMPLANT
PAK PIK VITRECTOMY CVS 25GA (OPHTHALMIC) ×3 IMPLANT
PENCIL BIPOLAR 25GA STR DISP (OPHTHALMIC RELATED) IMPLANT
PROBE LASER ILLUM FLEX CVD 25G (OPHTHALMIC) IMPLANT
RESERVOIR BACK FLUSH (MISCELLANEOUS) IMPLANT
ROLLS DENTAL (MISCELLANEOUS) IMPLANT
STOCKINETTE IMPERVIOUS 9X36 MD (GAUZE/BANDAGES/DRESSINGS) ×6 IMPLANT
STOPCOCK 4 WAY LG BORE MALE ST (IV SETS) IMPLANT
SUT MERSILENE 5 0 RD 1 DA (SUTURE) IMPLANT
SUT VICRYL 7 0 TG140 8 (SUTURE) IMPLANT
SWAB COLLECTION DEVICE MRSA (MISCELLANEOUS) ×3 IMPLANT
SYR TB 1ML LUER SLIP (SYRINGE) ×15 IMPLANT
TOWEL OR 17X24 6PK STRL BLUE (TOWEL DISPOSABLE) ×6 IMPLANT
TUBE ANAEROBIC SPECIMEN COL (MISCELLANEOUS) ×3 IMPLANT

## 2014-02-28 NOTE — Anesthesia Procedure Notes (Signed)
Procedure Name: MAC Date/Time: 02/28/2014 3:10 PM Performed by: Garrison Columbus T Pre-anesthesia Checklist: Patient identified, Emergency Drugs available, Suction available and Patient being monitored Patient Re-evaluated:Patient Re-evaluated prior to inductionOxygen Delivery Method: Circle system utilized Preoxygenation: Pre-oxygenation with 100% oxygen Intubation Type: IV induction Placement Confirmation: positive ETCO2 and breath sounds checked- equal and bilateral Dental Injury: Teeth and Oropharynx as per pre-operative assessment

## 2014-02-28 NOTE — Brief Op Note (Signed)
02/28/2014  3:43 PM  PATIENT:  [name]  69 y.o. female  PRE-OPERATIVE DIAGNOSIS:  INFECTED EYE,, ACUTE ENDOPHTHALMITIS RIGHT EYE  POST-OPERATIVE DIAGNOSIS:  INFECTED EYE,, SAME  PROCEDURE:  Procedure(s): PARS PLANA VITRECTOMY 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 WITH TAP AND ANTIBIOTIC INJECTION (Right)  SURGEON:  Surgeon(s) and Role:    * Hurman Horn, MD - Primary  PHYSICIAN ASSISTANT:   ASSISTANTS: none   ANESTHESIA:   MAC  EBL:     BLOOD ADMINISTERED:none  DRAINS: none   LOCAL MEDICATIONS USED:  XYLOCAINE 10 CC, 5 RETROBULBAR  SPECIMEN:  Aspirate,, VITREOUS,,,AND VITREOUS WASHINGS  DISPOSITION OF SPECIMEN:  PATHOLOGY  COUNTS:  YES  TOURNIQUET:  * No tourniquets in log *  DICTATION: .Other Dictation: Dictation Number [id number]  PLAN OF CARE: Discharge to home after PACU  PATIENT DISPOSITION:  PACU - hemodynamically stable.   Delay start of Pharmacological VTE agent (>24hrs) due to surgical blood loss or risk of bleeding: not applicable

## 2014-02-28 NOTE — Transfer of Care (Signed)
Immediate Anesthesia Transfer of Care Note  Patient: [name]  Procedure(s) Performed: Procedure(s): PARS PLANA VITRECTOMY 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 WITH TAP AND ANTIBIOTIC INJECTION (Right)  Patient Location: PACU  Anesthesia Type:MAC  Level of Consciousness: awake, alert  and oriented  Airway & Oxygen Therapy: Patient Spontanous Breathing  Post-op Assessment: Report given to RN, Post -op Vital signs reviewed and stable and Patient moving all extremities X 4  Post vital signs: Reviewed and stable  Last Vitals:  Filed Vitals:   02/28/14 1552  BP: 156/85  Pulse: 74  Temp: 36.8 C  Resp: 15    Complications: No apparent anesthesia complications

## 2014-02-28 NOTE — H&P (Signed)
[name] is an 69 y.o. female.   Chief Complaint: 1 DAY RAPID ONSET VISION LOSS RIGHT EYE,,, 4 DAYS POST CATARACT SURGERY OD. HPI: VISION LOSS RIGHT EYE 4 DAYS POST CATARACT EXTRACTION WITH IOL INSERTION, AND VISION LOSS TO HAND MOTION OD FROM 20/20 , 3 DAYS AGO, ON FIRST POST OPERATIVE DAY.  Past Medical History  Diagnosis Date  . Asthma   . Depression   . Hypertension     No past surgical history on file.  Family History  Problem Relation Age of Onset  . Cancer Mother     colon  . Stroke Father   . Heart disease Brother    Social History:  reports that she has been smoking.  She has never used smokeless tobacco. She reports that she does not drink alcohol or use illicit drugs.  Allergies:  Allergies  Allergen Reactions  . Codeine Itching    Mouth broke out     (Not in a hospital admission)  No results found for this or any previous visit (from the past 48 hour(s)). No results found.  Review of Systems  Constitutional: Negative.   HENT: Negative.   Eyes: Positive for blurred vision and photophobia.  Respiratory: Negative.   Cardiovascular: Negative.   Gastrointestinal: Negative.   Musculoskeletal: Negative.   Skin: Negative.   Neurological: Negative.   Endo/Heme/Allergies: Negative.   Psychiatric/Behavioral: Negative.     There were no vitals taken for this visit. Physical Exam  Constitutional: She is oriented to person, place, and time. She appears well-developed and well-nourished.  HENT:  Head: Normocephalic and atraumatic.  Eyes: Conjunctivae and EOM are normal. Pupils are equal, round, and reactive to light.    VISION OD,,  HAND MOTION  OS  20/25  FUNDUS OD,, NO DETAILS POSTERIORLY,, WITH  VITREOUS CELLS AND ANTERIOR CHAMBER WHITE CELLS,  RAPID ONSET,,  WITH VISION LOSS FROM 20/20 3 DAYS AGO  ENDOPHTHALMITIS  Neck: Normal range of motion. Neck supple.  Respiratory: Effort normal and breath sounds normal.  GI: Soft.  Musculoskeletal:  Normal range of motion.  Neurological: She is alert and oriented to person, place, and time.  Skin: Skin is warm and dry.  Psychiatric: She has a normal mood and affect. Her speech is normal and behavior is normal. Judgment and thought content normal. Cognition and memory are normal.     Assessment/Plan ACUTE ENDOPHTHALMITIS RIGHT EYE, WITH VISION LOSS. PROFOUND.     PLAN  NPO NOW. TO OR EMERGENTLY ,, FOR DIAGNOSTIC AND THERAPEUTIC VITRECTOMY RIGHT EYE,,,, DELIVERY OF INTRAVITREAL ANTIBIOTICS.  Taesha Goodell A 02/28/2014, 1:21 PM

## 2014-02-28 NOTE — Anesthesia Preprocedure Evaluation (Addendum)
Anesthesia Evaluation  Patient identified by MRN, date of birth, ID band Patient awake    Reviewed: Allergy & Precautions, NPO status , Patient's Chart, lab work & pertinent test resultsPreop documentation limited or incomplete due to emergent nature of procedure.  History of Anesthesia Complications Negative for: history of anesthetic complications  Airway Mallampati: II  TM Distance: >3 FB     Dental  (+) Dental Advisory Given, Edentulous Lower Permanent uppers:   Pulmonary asthma , Current Smoker,  breath sounds clear to auscultation        Cardiovascular hypertension, Pt. on medications + Peripheral Vascular Disease Rhythm:Regular     Neuro/Psych PSYCHIATRIC DISORDERS Depression negative neurological ROS     GI/Hepatic negative GI ROS, Neg liver ROS,   Endo/Other  negative endocrine ROS  Renal/GU negative Renal ROS     Musculoskeletal   Abdominal   Peds  Hematology negative hematology ROS (+)   Anesthesia Other Findings   Reproductive/Obstetrics                         Anesthesia Physical Anesthesia Plan  ASA: II and emergent  Anesthesia Plan: MAC   Post-op Pain Management:    Induction: Intravenous  Airway Management Planned: Natural Airway and Nasal Cannula  Additional Equipment: None  Intra-op Plan:   Post-operative Plan:   Informed Consent: I have reviewed the patients History and Physical, chart, labs and discussed the procedure including the risks, benefits and alternatives for the proposed anesthesia with the patient or authorized representative who has indicated his/her understanding and acceptance.   Dental advisory given  Plan Discussed with: CRNA, Anesthesiologist and Surgeon  Anesthesia Plan Comments:     Anesthesia Quick Evaluation

## 2014-03-01 NOTE — Op Note (Signed)
Melissa, [name] NO.:  [id number]  MEDICAL RECORD NO.:  [id number]  LOCATION:  [facility]                         FACILITY:  [facility]  PHYSICIAN:  [name], M.D.   DATE OF BIRTH:  Jun 10, 1945  DATE OF PROCEDURE:  02/28/2014 DATE OF DISCHARGE:  02/28/2014                              OPERATIVE REPORT   PREOPERATIVE DIAGNOSIS:  Acute endophthalmitis, right eye.  POSTOPERATIVE DIAGNOSIS:  Acute endophthalmitis, right eye.  PROCEDURE: 1. Posterior vitrectomy. 2. Vitreous tap diagnostic. 3. Vitreous washings. 4. Injection of intravitreal antibiotics-ceftazidime 2.25 mg in a     volume of 0.1 mL as well as vancomycin 1.0 mg and volume of 0.1 mL     into the vitreous.  SURGEON:  Melissa Demark. Emerson Barretto, M.D.  ANESTHESIA:  Local retrobulbar monitored control for the right eye.  INDICATIONS FOR PROCEDURE:  The patient is a 68 year old woman, who has profound vision  loss on the basis of acute endophthalmitis of 1 day onset of vision loss from 20/20, 1 day postoperatively 3 days ago and now hand motion with findings of 4+ white cells in the anterior chamber and also in the anterior vitreous in the anterior hyaloid with obscuration of the fundus examination.  The patient understands this is an attempt to debulk the inflammatory  process inside the eye as well as to acquire diagnostic  specimen for determination of type of possible infectious agent.  The patient also understands this is an attempt and it is necessary to render therapeutic intervention today using intravitreal antibiotics using vancomycin and Tressie Ellis so as to allow therapeutic intervention to hopefully halt the progression of disease and recover visual functioning and globe status.  The patient understands the risks of anesthesia, rare recurrence of death, loss of the eye from the underlying condition, including but not limited to hemorrhage, infection, scarring, need for another surgery, no change  in vision, loss of vision, progressive disease despite intervention. Appropriate signed consent was obtained.  The patient was taken to the operating room.  In the operating room, appropriate monitors were followed by mild sedation.  Proper site selection had been confirmed in the right eye with surgical staff and surgeon.  Thereafter under mild sedation, 2% Xylocaine 5 mL injected retrobulbar with additional 5 mL lidocaine in modified van Lint.  The right periocular region was prepped and draped in usual fashion.  Upon entering the operating room, surgeon again performed surgical time-out with the operating staff confirming the right eye as the proper site.  At this time, final sterile prep and drape was completed.  Lip speculum applied.  A 26-gauge needle on a 1 mL syringe was then placed to the pars plana in an atraumatic fashion and a fluid filled vitreous was removed, not to allow for plating directly to blood agar and blood agar plates were plated directly by the surgeon in the operating room.  The remnants of the specimen were then placed into the transport medium for bacterial determination.  At this time, a 25-gauge trocar was placed into the site of the scleral placement of 26-gauge needle.  Fusion was then placed  and then turned on.  Superior trocars were applied.  Core tractors had begun  to clear the vitreous opacification  anteriorly.  This proceeded atraumatically. It became clear enough to proceed through the mid vitreous.  Biomet attachment was swung into position and the mid vitreous was then removed and finally down to the posterior segment, I was able to see and clearly delineate the optic nerve macular region, which was free preretinal abscess and free of preretinal major  inflammation.  There were scattered intraretinal hemorrhages in the retina.  There were no other complications or no retinal holes or tears.  No aggressive attempt was made to remove the posterior  hyaloid, simply debulking of the inflamed and potentially infected vitreous was undertaken.  Anterior hyaloid was confirmed and removed.  Small central surgical posterior capsulotomy was created to allow for enhanced diffusion of  the intravitreal antibiotics to be placed.  No complications  occurred.  At this time, the instrument was removed from the eye, superior trocars were removed from the eye, and lower infusion pressures on the wounds were secured.  The infusion was then removed.  At this time, intravitreal vancomycin 0.1 mL volume and Fortaz 0.1 mL volume of each were injected intra-arterially and of 0.3 mL of each of these were placed subconjunctivally.  A small half a mL of subconjunctival Decadron was also applied.  Sterile patch Fox shield applied and intraocular pressure was assessed and found to be adequate.  The patient is awakened from anesthesia without difficulty and taken to the PACU in good and stable condition and sent home as an outpatient.     Melissa Demark [name], M.D.     GAR/MEDQ  D:  02/28/2014  T:  03/01/2014  Job:  [id number]  cc:   [name], M.D.

## 2014-03-02 LAB — EYE CULTURE

## 2014-03-03 ENCOUNTER — Encounter (HOSPITAL_COMMUNITY): Payer: Self-pay | Admitting: Ophthalmology

## 2014-03-03 LAB — POCT I-STAT, CHEM 8
BUN: 21 mg/dL (ref 6–23)
CREATININE: 1 mg/dL (ref 0.50–1.10)
Calcium, Ion: 1.13 mmol/L (ref 1.13–1.30)
Chloride: 104 mmol/L (ref 96–112)
Glucose, Bld: 91 mg/dL (ref 70–99)
HCT: 42 % (ref 36.0–46.0)
Hemoglobin: 14.3 g/dL (ref 12.0–15.0)
Potassium: 4.1 mmol/L (ref 3.5–5.1)
Sodium: 138 mmol/L (ref 135–145)
TCO2: 21 mmol/L (ref 0–100)

## 2014-03-03 LAB — EYE CULTURE

## 2014-03-03 LAB — CULTURE, BLOOD (SINGLE)

## 2014-03-03 NOTE — Anesthesia Postprocedure Evaluation (Signed)
  Anesthesia Post-op Note  Patient: [name]  Procedure(s) Performed: Procedure(s): PARS PLANA VITRECTOMY 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 WITH TAP AND ANTIBIOTIC INJECTION (Right)  Patient Location: PACU  Anesthesia Type:General  Level of Consciousness: awake  Airway and Oxygen Therapy: Patient Spontanous Breathing  Post-op Pain: none  Post-op Assessment: Post-op Vital signs reviewed, Patient's Cardiovascular Status Stable, Respiratory Function Stable, Patent Airway, No signs of Nausea or vomiting and Pain level controlled  Post-op Vital Signs: Reviewed and stable  Last Vitals:  Filed Vitals:   02/28/14 1622  BP: 175/78  Pulse: 70  Temp: 36.1 C  Resp: 15    Complications: No apparent anesthesia complications

## 2014-03-04 ENCOUNTER — Other Ambulatory Visit: Payer: Self-pay | Admitting: Ophthalmology

## 2014-03-04 LAB — EYE CULTURE

## 2014-05-30 ENCOUNTER — Other Ambulatory Visit: Payer: Self-pay | Admitting: Gastroenterology

## 2014-08-19 ENCOUNTER — Other Ambulatory Visit: Payer: Self-pay | Admitting: *Deleted

## 2014-08-19 DIAGNOSIS — M79605 Pain in left leg: Secondary | ICD-10-CM

## 2014-08-19 DIAGNOSIS — M79604 Pain in right leg: Secondary | ICD-10-CM

## 2014-08-19 DIAGNOSIS — I739 Peripheral vascular disease, unspecified: Secondary | ICD-10-CM

## 2014-09-09 ENCOUNTER — Encounter: Payer: Self-pay | Admitting: Vascular Surgery

## 2014-09-10 ENCOUNTER — Ambulatory Visit (HOSPITAL_COMMUNITY)
Admission: RE | Admit: 2014-09-10 | Discharge: 2014-09-10 | Disposition: A | Discharge status: Home / Self Care | Payer: Medicare Other | Source: Ambulatory Visit | Attending: Vascular Surgery | Admitting: Vascular Surgery

## 2014-09-10 ENCOUNTER — Encounter: Payer: Self-pay | Admitting: Vascular Surgery

## 2014-09-10 ENCOUNTER — Ambulatory Visit (INDEPENDENT_AMBULATORY_CARE_PROVIDER_SITE_OTHER): Payer: Medicare Other | Admitting: Vascular Surgery

## 2014-09-10 VITALS — BP 154/63 | HR 73 | Temp 97.7°F | Resp 16 | Ht 60.0 in | Wt 137.0 lb

## 2014-09-10 DIAGNOSIS — I739 Peripheral vascular disease, unspecified: Secondary | ICD-10-CM | POA: Diagnosis not present

## 2014-09-10 DIAGNOSIS — M79604 Pain in right leg: Secondary | ICD-10-CM | POA: Insufficient documentation

## 2014-09-10 DIAGNOSIS — M79605 Pain in left leg: Secondary | ICD-10-CM | POA: Diagnosis not present

## 2014-09-10 DIAGNOSIS — I70219 Atherosclerosis of native arteries of extremities with intermittent claudication, unspecified extremity: Secondary | ICD-10-CM | POA: Diagnosis not present

## 2014-09-10 DIAGNOSIS — I10 Essential (primary) hypertension: Secondary | ICD-10-CM | POA: Insufficient documentation

## 2014-09-10 NOTE — Progress Notes (Signed)
[facility] [place]  Patient name: [name] MRN: [id number] DOB: [birth year] Sex: female  REASON FOR VISIT: Severe bilateral lower extremity claudication.  HPI: [name] is a 69 y.o. female who I last saw on 05/23/2012. 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 The left iliac system is open but she has small calcified arteries. At that time ABI on the right was 55% an ABI on the left was 90%. We talked about the importance of tobacco cessation and I planned on seeing her back as needed.  Since I saw her last, she continues to have bilateral lower extremity claudication which mostly involves her hips. Her symptoms are brought on by ambulation and relieved with rest. Her symptoms occur at 15 minutes of walking. Her symptoms are equal on both sides. He denies any history of rest pain or history of nonhealing ulcers. She quit smoking 6 months ago. She had been smoking 1 pack per day.  She does try to exercise but has some fear of falling and breaking her hip and also about not being him to get back if she goes on a long walk. She seems to have a lot of interest in [place].  She is on aspirin. She is not on a statin.  Past Medical History  Diagnosis Date  . Asthma   . Depression   . Hypertension   . Sleep apnea    Family History  Problem Relation Age of Onset  . Cancer Mother     colon  . Stroke Father   . Heart disease Brother    SOCIAL HISTORY: Social History  Substance Use Topics  . Smoking status: Current Every Day Smoker  . Smokeless tobacco: Never Used  . Alcohol Use: No   Allergies  Allergen Reactions  . Codeine Itching    Mouth broke out   Current Outpatient Prescriptions  Medication Sig Dispense Refill  . aspirin 81 MG tablet Take 81 mg by mouth daily.    [name] atorvastatin (LIPITOR) 20 MG tablet Take 20 mg by mouth daily.    . Cholecalciferol (VITAMIN D PO) Take 1 tablet by  mouth daily.    . fluticasone (FLONASE) 50 MCG/ACT nasal spray Place 2 sprays into the nose daily as needed. For allergies     No current facility-administered medications for this visit.   REVIEW OF SYSTEMS: Valu.Nieves ] denotes positive finding; [  ] denotes negative finding  CARDIOVASCULAR:  [ ]  chest pain   [ ]  chest pressure   [ ]  palpitations   [ ]  orthopnea   [ ]  dyspnea on exertion   Valu.Nieves ] claudication   [ ]  rest pain   [ ]  DVT   [ ]  phlebitis PULMONARY:   [ ]  productive cough   [ ]  asthma   [ ]  wheezing NEUROLOGIC:   [ ]  weakness  [ ]  paresthesias  [ ]  aphasia  [ ]  amaurosis  [ ]  dizziness HEMATOLOGIC:   [ ]  bleeding problems   [ ]  clotting disorders MUSCULOSKELETAL:  [ ]  joint pain   [ ]  joint swelling [ ]  leg swelling GASTROINTESTINAL: [ ]   blood in stool  [ ]   hematemesis GENITOURINARY:  [ ]   dysuria  [ ]   hematuria PSYCHIATRIC:  [ ]  history of major depression INTEGUMENTARY:  [ ]  rashes  [ ]  ulcers CONSTITUTIONAL:  [ ]  fever   [ ]  chills  PHYSICAL EXAM: There were  no vitals filed for this visit. GENERAL: The patient is a well-nourished female, in no acute distress. The vital signs are documented above. CARDIAC: There is a regular rate and rhythm.  VASCULAR: I do not detect carotid bruits. On the right side, I cannot palpate a femoral, popliteal, dorsalis pedis, or posterior tibial pulse. On the left side, she has a palpable femoral, popliteal, and posterior tibial pulse. These pulses are slightly diminished. PULMONARY: There is good air exchange bilaterally without wheezing or rales. ABDOMEN: Soft and non-tender with normal pitched bowel sounds.  MUSCULOSKELETAL: There are no major deformities or cyanosis. NEUROLOGIC: No focal weakness or paresthesias are detected. SKIN: There are no ulcers or rashes noted. PSYCHIATRIC: The patient has a normal affect.  DATA:  I have independently interpreted her arterial Doppler study today which shows that she has monophasic Doppler signals in  the right foot and the dorsalis pedis and posterior tibial positions with an ABI of 61%.  This is stable compared to 55% and may of 2014. On the left side she has a triphasic posterior tibial signal with a biphasic dorsalis pedis signal. ABI on the left is 80% which is down slightly from 90% in May 2014.  I have reviewed her previous arteriogram which shows that she has small calcified arteries. She has a known right external iliac artery occlusion.  MEDICAL ISSUES:  PERIPHERAL VASCULAR DISEASE: This patient has a known right external iliac artery occlusion. She has small calcified arteries including the iliac arteries on the left. Although she has a femoral pulse and posterior tibial pulse, I think that her symptoms are related to claudication given the small size of her arteries. Her symptoms are classic for claudication. I have again explained to her that given that her arteries are small and calcified this does make them more challenging and risky to work on and therefore I would continue to favor a conservative approach. She plans on staying off of the cigarettes. She does state that she's gained about 20 pounds since quitting and this may be contributing to her symptoms also. I've encouraged her to continue exercising and I'll try to find out about VASCULAR REHABILITATION for her. She seems very interested in this. She does continue her aspirin. She will discuss potentially starting a statin with her primary care doctor Dr. Virgina Jock. She knows to call for symptoms progressed.      Deitra Mayo [facility] [place]: [phone]

## 2014-09-10 NOTE — Progress Notes (Signed)
Filed Vitals:   09/10/14 1258 09/10/14 1307  BP: 151/74 154/63  Pulse: 68 73  Temp: 97.7 F (36.5 C)   TempSrc: Oral   Resp: 16   Height: 5' (1.524 m)   Weight: 137 lb (62.143 kg)   SpO2: 100%

## 2015-11-25 ENCOUNTER — Encounter: Payer: Self-pay | Admitting: Podiatry

## 2015-11-25 ENCOUNTER — Ambulatory Visit (INDEPENDENT_AMBULATORY_CARE_PROVIDER_SITE_OTHER): Payer: Medicare Other | Admitting: Podiatry

## 2015-11-25 VITALS — BP 182/92 | HR 75

## 2015-11-25 DIAGNOSIS — M79676 Pain in unspecified toe(s): Secondary | ICD-10-CM | POA: Diagnosis not present

## 2015-11-25 DIAGNOSIS — L03039 Cellulitis of unspecified toe: Secondary | ICD-10-CM

## 2015-11-25 DIAGNOSIS — L600 Ingrowing nail: Secondary | ICD-10-CM

## 2015-11-25 NOTE — Patient Instructions (Signed)

## 2015-11-29 NOTE — Progress Notes (Signed)
Subjective: Patient presents today for evaluation of pain in her toe(s). Patient is concerned for possible ingrown nail. Patient states that the pain has been present for a few weeks now. Patient presents today for further treatment and evaluation.  Objective:  General: Well developed, nourished, in no acute distress, alert and oriented x3   Dermatology: Skin is warm, dry and supple bilateral. Medial border of the bilateral great toes appears to be erythematous with evidence of an ingrowing nail. Purulent drainage noted with intruding nail into the respective nail fold. Pain on palpation noted to the border of the nail fold. The remaining nails appear unremarkable at this time. There are no open sores, lesions.  Vascular: Dorsalis Pedis artery and Posterior Tibial artery pedal pulses palpable. No lower extremity edema noted.   Neruologic: Grossly intact via light touch bilateral.  Musculoskeletal: Muscular strength within normal limits in all groups bilateral. Normal range of motion noted to all pedal and ankle joints.   Assesement: #1 paronychia right great toe medial border #2 paronychia left great toe medial border #3 ingrowing nail right great toe medial border #4 ingrowing nail left great toe medial border #5 pain in bilateral great toes   Plan of Care:  1. Patient evaluated.  2. Discussed treatment alternatives and plan of care. Explained nail avulsion procedure and post procedure course to patient. 3. Patient opted for permanent partial nail avulsion.  4. Prior to procedure, local anesthesia infiltration utilized using 3 ml of a 50:50 mixture of 2% plain lidocaine and 0.5% plain marcaine in a normal hallux block fashion and a betadine prep performed.  5. Partial permanent nail avulsion with chemical matrixectomy performed using [id number] applications of phenol followed by alcohol flush.  6. Light dressing applied. 7. Return to clinic in 2 weeks.   Patient goes by "flash"  Dr.  Edrick Kins [facility]

## 2015-12-09 ENCOUNTER — Encounter: Payer: Self-pay | Admitting: Podiatry

## 2015-12-09 ENCOUNTER — Ambulatory Visit (INDEPENDENT_AMBULATORY_CARE_PROVIDER_SITE_OTHER): Payer: Medicare Other | Admitting: Podiatry

## 2015-12-09 DIAGNOSIS — S90851A Superficial foreign body, right foot, initial encounter: Secondary | ICD-10-CM | POA: Diagnosis not present

## 2015-12-09 DIAGNOSIS — S91109D Unspecified open wound of unspecified toe(s) without damage to nail, subsequent encounter: Secondary | ICD-10-CM

## 2015-12-09 DIAGNOSIS — M79671 Pain in right foot: Secondary | ICD-10-CM | POA: Diagnosis not present

## 2015-12-09 DIAGNOSIS — M79676 Pain in unspecified toe(s): Secondary | ICD-10-CM

## 2015-12-09 DIAGNOSIS — L03039 Cellulitis of unspecified toe: Secondary | ICD-10-CM

## 2015-12-09 NOTE — Progress Notes (Signed)
Subjective: Patient presents today 2 weeks post ingrown nail permanent nail avulsion procedure. Patient states that the toe and nail fold is feeling much better.  Patient also has complaint of a possible splinter to the right medial midfoot. Patient states has been painful for several weeks now. She notices a small pinpoint puncture wound. Patient denies trauma, however G she does believe that possibly she stepped on something. Patient presents today for further treatment and evaluation  Objective: Skin is warm, dry and supple. Nail and respective nail fold appears to be healing appropriately. Open wound to the associated nail fold with a granular wound base and moderate amount of fibrotic tissue. Minimal drainage noted. Mild erythema around the periungual region likely due to phenol chemical matricectomy.  There is a small pinpoint entry wound to the right medial foot. There is callus formation around the area as well. Painful on palpation  Assessment: #1 postop permanent partial nail avulsion #2 open wound periungual nail fold of respective digit.  #3 possible splinter right medial foot  Plan of care: #1 patient was evaluated  #2 debridement of open wound was performed to the periungual border of the respective toe using a currette. Antibiotic ointment and Band-Aid was applied. #3 the pinpoint wound was explored and a small fragment was removed possible wood splinter. Dry sterile dressing applied.  #4 patient is to return to clinic on a PRN  basis.

## 2016-03-18 ENCOUNTER — Inpatient Hospital Stay (HOSPITAL_COMMUNITY)
Admission: EM | Admit: 2016-03-18 | Discharge: 2016-03-21 | DRG: 042 | Disposition: A | Discharge status: Home / Self Care | Payer: Medicare Other | Attending: Internal Medicine | Admitting: Internal Medicine

## 2016-03-18 ENCOUNTER — Emergency Department (HOSPITAL_COMMUNITY): Payer: Medicare Other

## 2016-03-18 ENCOUNTER — Encounter (HOSPITAL_COMMUNITY): Payer: Self-pay

## 2016-03-18 DIAGNOSIS — M25562 Pain in left knee: Secondary | ICD-10-CM | POA: Diagnosis present

## 2016-03-18 DIAGNOSIS — Z8249 Family history of ischemic heart disease and other diseases of the circulatory system: Secondary | ICD-10-CM

## 2016-03-18 DIAGNOSIS — I4891 Unspecified atrial fibrillation: Secondary | ICD-10-CM | POA: Diagnosis present

## 2016-03-18 DIAGNOSIS — Z823 Family history of stroke: Secondary | ICD-10-CM

## 2016-03-18 DIAGNOSIS — Z8673 Personal history of transient ischemic attack (TIA), and cerebral infarction without residual deficits: Secondary | ICD-10-CM | POA: Diagnosis present

## 2016-03-18 DIAGNOSIS — M25561 Pain in right knee: Secondary | ICD-10-CM | POA: Diagnosis present

## 2016-03-18 DIAGNOSIS — G4733 Obstructive sleep apnea (adult) (pediatric): Secondary | ICD-10-CM | POA: Diagnosis present

## 2016-03-18 DIAGNOSIS — Y92410 Unspecified street and highway as the place of occurrence of the external cause: Secondary | ICD-10-CM

## 2016-03-18 DIAGNOSIS — I6300 Cerebral infarction due to thrombosis of unspecified precerebral artery: Secondary | ICD-10-CM | POA: Diagnosis not present

## 2016-03-18 DIAGNOSIS — I10 Essential (primary) hypertension: Secondary | ICD-10-CM | POA: Diagnosis present

## 2016-03-18 DIAGNOSIS — Z7982 Long term (current) use of aspirin: Secondary | ICD-10-CM

## 2016-03-18 DIAGNOSIS — R0789 Other chest pain: Secondary | ICD-10-CM | POA: Diagnosis present

## 2016-03-18 DIAGNOSIS — I708 Atherosclerosis of other arteries: Secondary | ICD-10-CM | POA: Diagnosis present

## 2016-03-18 DIAGNOSIS — I63431 Cerebral infarction due to embolism of right posterior cerebral artery: Principal | ICD-10-CM | POA: Diagnosis present

## 2016-03-18 DIAGNOSIS — I639 Cerebral infarction, unspecified: Secondary | ICD-10-CM | POA: Diagnosis not present

## 2016-03-18 DIAGNOSIS — I6503 Occlusion and stenosis of bilateral vertebral arteries: Secondary | ICD-10-CM | POA: Diagnosis present

## 2016-03-18 DIAGNOSIS — R29700 NIHSS score 0: Secondary | ICD-10-CM | POA: Diagnosis present

## 2016-03-18 DIAGNOSIS — Z87891 Personal history of nicotine dependence: Secondary | ICD-10-CM

## 2016-03-18 DIAGNOSIS — R262 Difficulty in walking, not elsewhere classified: Secondary | ICD-10-CM | POA: Diagnosis present

## 2016-03-18 DIAGNOSIS — M8588 Other specified disorders of bone density and structure, other site: Secondary | ICD-10-CM | POA: Diagnosis present

## 2016-03-18 DIAGNOSIS — J45909 Unspecified asthma, uncomplicated: Secondary | ICD-10-CM | POA: Diagnosis present

## 2016-03-18 DIAGNOSIS — E785 Hyperlipidemia, unspecified: Secondary | ICD-10-CM | POA: Diagnosis present

## 2016-03-18 DIAGNOSIS — Z800 Family history of malignant neoplasm of digestive organs: Secondary | ICD-10-CM

## 2016-03-18 DIAGNOSIS — H538 Other visual disturbances: Secondary | ICD-10-CM | POA: Diagnosis present

## 2016-03-18 DIAGNOSIS — R292 Abnormal reflex: Secondary | ICD-10-CM

## 2016-03-18 LAB — CBC WITH DIFFERENTIAL/PLATELET
Basophils Absolute: 0 10*3/uL (ref 0.0–0.1)
Basophils Relative: 0 %
Eosinophils Absolute: 0.1 10*3/uL (ref 0.0–0.7)
Eosinophils Relative: 1 %
HCT: 39.7 % (ref 36.0–46.0)
Hemoglobin: 13.1 g/dL (ref 12.0–15.0)
Lymphocytes Relative: 17 %
Lymphs Abs: 1.6 10*3/uL (ref 0.7–4.0)
MCH: 33.6 pg (ref 26.0–34.0)
MCHC: 33 g/dL (ref 30.0–36.0)
MCV: 101.8 fL — ABNORMAL HIGH (ref 78.0–100.0)
Monocytes Absolute: 0.6 10*3/uL (ref 0.1–1.0)
Monocytes Relative: 7 %
Neutro Abs: 7 10*3/uL (ref 1.7–7.7)
Neutrophils Relative %: 75 %
Platelets: 216 10*3/uL (ref 150–400)
RBC: 3.9 MIL/uL (ref 3.87–5.11)
RDW: 13.7 % (ref 11.5–15.5)
WBC: 9.2 10*3/uL (ref 4.0–10.5)

## 2016-03-18 LAB — COMPREHENSIVE METABOLIC PANEL
ALT: 24 U/L (ref 14–54)
AST: 56 U/L — ABNORMAL HIGH (ref 15–41)
Albumin: 3.6 g/dL (ref 3.5–5.0)
Alkaline Phosphatase: 80 U/L (ref 38–126)
Anion gap: 11 (ref 5–15)
BUN: 8 mg/dL (ref 6–20)
CO2: 22 mmol/L (ref 22–32)
Calcium: 8.8 mg/dL — ABNORMAL LOW (ref 8.9–10.3)
Chloride: 103 mmol/L (ref 101–111)
Creatinine, Ser: 1.01 mg/dL — ABNORMAL HIGH (ref 0.44–1.00)
GFR calc Af Amer: >60 mL/min (ref >60)
GFR calc non Af Amer: 55 mL/min — ABNORMAL LOW (ref >60)
Glucose, Bld: 91 mg/dL (ref 65–99)
Potassium: 4.4 mmol/L (ref 3.5–5.1)
Sodium: 136 mmol/L (ref 135–145)
Total Bilirubin: 0.4 mg/dL (ref 0.3–1.2)
Total Protein: 5.8 g/dL — ABNORMAL LOW (ref 6.5–8.1)

## 2016-03-18 LAB — I-STAT CREATININE, ED: Creatinine, Ser: 0.9 mg/dL (ref 0.44–1.00)

## 2016-03-18 LAB — URINALYSIS, ROUTINE W REFLEX MICROSCOPIC
Bilirubin Urine: NEGATIVE
Glucose, UA: NEGATIVE mg/dL
Hgb urine dipstick: NEGATIVE
Ketones, ur: NEGATIVE mg/dL
Leukocytes, UA: NEGATIVE
Nitrite: NEGATIVE
Protein, ur: NEGATIVE mg/dL
Specific Gravity, Urine: 1.003 — ABNORMAL LOW (ref 1.005–1.030)
pH: 5 (ref 5.0–8.0)

## 2016-03-18 MED ORDER — ACETAMINOPHEN 650 MG RE SUPP
650.0000 mg | RECTAL | Status: DC | PRN
Start: 2016-03-18 — End: 2016-03-21

## 2016-03-18 MED ORDER — ESCITALOPRAM OXALATE 10 MG PO TABS
20.0000 mg | ORAL_TABLET | Freq: Every day | ORAL | Status: DC
  Administered 2016-03-18 – 2016-03-20 (×3): 20 mg via ORAL
  Filled 2016-03-18 (×3): qty 2

## 2016-03-18 MED ORDER — ATORVASTATIN CALCIUM 10 MG PO TABS
20.0000 mg | ORAL_TABLET | Freq: Every day | ORAL | Status: DC
  Administered 2016-03-18 – 2016-03-20 (×3): 20 mg via ORAL
  Filled 2016-03-18: qty 2
  Filled 2016-03-18: qty 1
  Filled 2016-03-18 (×2): qty 2

## 2016-03-18 MED ORDER — ASPIRIN 325 MG PO TABS
325.0000 mg | ORAL_TABLET | Freq: Every day | ORAL | Status: DC
  Administered 2016-03-19: 325 mg via ORAL
  Filled 2016-03-18: qty 1

## 2016-03-18 MED ORDER — GADOBENATE DIMEGLUMINE 529 MG/ML IV SOLN
13.0000 mL | Freq: Once | INTRAVENOUS | Status: AC | PRN
  Administered 2016-03-18: 13 mL via INTRAVENOUS

## 2016-03-18 MED ORDER — ACETAMINOPHEN 325 MG PO TABS
650.0000 mg | ORAL_TABLET | ORAL | Status: DC | PRN
  Administered 2016-03-20 (×2): 650 mg via ORAL
  Filled 2016-03-18 (×2): qty 2

## 2016-03-18 MED ORDER — ALBUTEROL SULFATE (2.5 MG/3ML) 0.083% IN NEBU: 2.5000 mg | INHALATION_SOLUTION | RESPIRATORY_TRACT | Status: DC | PRN

## 2016-03-18 MED ORDER — SODIUM CHLORIDE 0.9 % IV SOLN
INTRAVENOUS | Status: DC
  Administered 2016-03-18: 22:00:00 via INTRAVENOUS

## 2016-03-18 MED ORDER — SENNOSIDES-DOCUSATE SODIUM 8.6-50 MG PO TABS
1.0000 | ORAL_TABLET | Freq: Every evening | ORAL | Status: DC | PRN
  Filled 2016-03-18: qty 1

## 2016-03-18 MED ORDER — ENOXAPARIN SODIUM 40 MG/0.4ML ~~LOC~~ SOLN
40.0000 mg | SUBCUTANEOUS | Status: DC
  Administered 2016-03-19 – 2016-03-20 (×2): 40 mg via SUBCUTANEOUS
  Filled 2016-03-18 (×3): qty 0.4

## 2016-03-18 MED ORDER — HYDRALAZINE HCL 20 MG/ML IJ SOLN
5.0000 mg | Freq: Once | INTRAMUSCULAR | Status: AC
  Administered 2016-03-18: 5 mg via INTRAVENOUS
  Filled 2016-03-18: qty 1

## 2016-03-18 MED ORDER — ALBUTEROL SULFATE HFA 108 (90 BASE) MCG/ACT IN AERS: 1.0000 | INHALATION_SPRAY | RESPIRATORY_TRACT | Status: DC | PRN

## 2016-03-18 MED ORDER — IOPAMIDOL (ISOVUE-300) INJECTION 61%
INTRAVENOUS | Status: AC
  Administered 2016-03-18: 75 mL
  Filled 2016-03-18: qty 100

## 2016-03-18 MED ORDER — ACETAMINOPHEN 160 MG/5ML PO SOLN: 650.0000 mg | ORAL | Status: DC | PRN

## 2016-03-18 MED ORDER — FLUTICASONE PROPIONATE 50 MCG/ACT NA SUSP
2.0000 | Freq: Every day | NASAL | Status: DC | PRN
  Filled 2016-03-18: qty 16

## 2016-03-18 MED ORDER — STROKE: EARLY STAGES OF RECOVERY BOOK
Freq: Once | Status: AC
  Administered 2016-03-19: 02:00:00
  Filled 2016-03-18 (×2): qty 1

## 2016-03-18 NOTE — ED Notes (Signed)
Pt given water to drink per Roselyn Reef, RN
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
## 2016-03-18 NOTE — ED Notes (Signed)
Pt ambulatory with one assist no problems to the bathroom
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
## 2016-03-18 NOTE — Progress Notes (Signed)
Pt admitted from ED with stroke like symptoms, alert and oriented, denies pain but c/o of pain in chest due to cough and congestion, pt settled in bed with call light at bedside, pt reassured, will however continue to monitor. Ob[place]-Asidi, Nolie Bignell Efe

## 2016-03-18 NOTE — ED Notes (Signed)
Patient transported to MRI 
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
## 2016-03-19 ENCOUNTER — Observation Stay (HOSPITAL_BASED_OUTPATIENT_CLINIC_OR_DEPARTMENT_OTHER): Payer: Medicare Other

## 2016-03-19 ENCOUNTER — Observation Stay (HOSPITAL_COMMUNITY): Payer: Medicare Other

## 2016-03-19 ENCOUNTER — Encounter (HOSPITAL_COMMUNITY): Payer: Medicare Other

## 2016-03-19 DIAGNOSIS — I6503 Occlusion and stenosis of bilateral vertebral arteries: Secondary | ICD-10-CM | POA: Diagnosis present

## 2016-03-19 DIAGNOSIS — Z800 Family history of malignant neoplasm of digestive organs: Secondary | ICD-10-CM | POA: Diagnosis not present

## 2016-03-19 DIAGNOSIS — H538 Other visual disturbances: Secondary | ICD-10-CM | POA: Diagnosis present

## 2016-03-19 DIAGNOSIS — R262 Difficulty in walking, not elsewhere classified: Secondary | ICD-10-CM | POA: Diagnosis present

## 2016-03-19 DIAGNOSIS — I4891 Unspecified atrial fibrillation: Secondary | ICD-10-CM | POA: Diagnosis present

## 2016-03-19 DIAGNOSIS — I6789 Other cerebrovascular disease: Secondary | ICD-10-CM | POA: Diagnosis not present

## 2016-03-19 DIAGNOSIS — M25562 Pain in left knee: Secondary | ICD-10-CM | POA: Diagnosis present

## 2016-03-19 DIAGNOSIS — J45909 Unspecified asthma, uncomplicated: Secondary | ICD-10-CM | POA: Diagnosis present

## 2016-03-19 DIAGNOSIS — Z823 Family history of stroke: Secondary | ICD-10-CM | POA: Diagnosis not present

## 2016-03-19 DIAGNOSIS — G4733 Obstructive sleep apnea (adult) (pediatric): Secondary | ICD-10-CM | POA: Diagnosis present

## 2016-03-19 DIAGNOSIS — Z7982 Long term (current) use of aspirin: Secondary | ICD-10-CM | POA: Diagnosis not present

## 2016-03-19 DIAGNOSIS — E785 Hyperlipidemia, unspecified: Secondary | ICD-10-CM | POA: Diagnosis present

## 2016-03-19 DIAGNOSIS — I708 Atherosclerosis of other arteries: Secondary | ICD-10-CM | POA: Diagnosis present

## 2016-03-19 DIAGNOSIS — M8588 Other specified disorders of bone density and structure, other site: Secondary | ICD-10-CM | POA: Diagnosis present

## 2016-03-19 DIAGNOSIS — I10 Essential (primary) hypertension: Secondary | ICD-10-CM | POA: Diagnosis present

## 2016-03-19 DIAGNOSIS — I63431 Cerebral infarction due to embolism of right posterior cerebral artery: Secondary | ICD-10-CM | POA: Diagnosis present

## 2016-03-19 DIAGNOSIS — Z8249 Family history of ischemic heart disease and other diseases of the circulatory system: Secondary | ICD-10-CM | POA: Diagnosis not present

## 2016-03-19 DIAGNOSIS — I638 Other cerebral infarction: Secondary | ICD-10-CM | POA: Diagnosis not present

## 2016-03-19 DIAGNOSIS — R292 Abnormal reflex: Secondary | ICD-10-CM | POA: Diagnosis present

## 2016-03-19 DIAGNOSIS — I639 Cerebral infarction, unspecified: Secondary | ICD-10-CM | POA: Diagnosis present

## 2016-03-19 DIAGNOSIS — Y92410 Unspecified street and highway as the place of occurrence of the external cause: Secondary | ICD-10-CM | POA: Diagnosis not present

## 2016-03-19 DIAGNOSIS — Z87891 Personal history of nicotine dependence: Secondary | ICD-10-CM | POA: Diagnosis not present

## 2016-03-19 DIAGNOSIS — M25561 Pain in right knee: Secondary | ICD-10-CM | POA: Diagnosis present

## 2016-03-19 DIAGNOSIS — R0789 Other chest pain: Secondary | ICD-10-CM | POA: Diagnosis present

## 2016-03-19 DIAGNOSIS — R29700 NIHSS score 0: Secondary | ICD-10-CM | POA: Diagnosis present

## 2016-03-19 DIAGNOSIS — I6300 Cerebral infarction due to thrombosis of unspecified precerebral artery: Secondary | ICD-10-CM | POA: Diagnosis not present

## 2016-03-19 LAB — ECHOCARDIOGRAM COMPLETE
HEIGHTINCHES: 60 in
Weight: 1774.26 oz

## 2016-03-19 LAB — LIPID PANEL
CHOL/HDL RATIO: 2.1 ratio
CHOLESTEROL: 184 mg/dL (ref 0–200)
HDL: 87 mg/dL (ref >40)
LDL Cholesterol: 73 mg/dL (ref 0–99)
TRIGLYCERIDES: 121 mg/dL (ref <150)
VLDL: 24 mg/dL (ref 0–40)

## 2016-03-19 MED ORDER — BOOST PLUS PO LIQD
237.0000 mL | Freq: Two times a day (BID) | ORAL | Status: DC
  Administered 2016-03-20 (×2): 237 mL via ORAL
  Filled 2016-03-19 (×6): qty 237

## 2016-03-19 MED ORDER — CLOPIDOGREL BISULFATE 75 MG PO TABS
75.0000 mg | ORAL_TABLET | Freq: Every day | ORAL | Status: DC
  Administered 2016-03-19 – 2016-03-20 (×2): 75 mg via ORAL
  Filled 2016-03-19 (×2): qty 1

## 2016-03-19 MED ORDER — HYDRALAZINE HCL 20 MG/ML IJ SOLN
5.0000 mg | Freq: Once | INTRAMUSCULAR | Status: AC
  Administered 2016-03-19: 5 mg via INTRAVENOUS
  Filled 2016-03-19: qty 1

## 2016-03-19 MED ORDER — TRAMADOL HCL 50 MG PO TABS
50.0000 mg | ORAL_TABLET | Freq: Once | ORAL | Status: AC
  Administered 2016-03-19: 50 mg via ORAL
  Filled 2016-03-19: qty 1

## 2016-03-19 MED ORDER — HYDRALAZINE HCL 20 MG/ML IJ SOLN
10.0000 mg | Freq: Four times a day (QID) | INTRAMUSCULAR | Status: DC | PRN
  Administered 2016-03-19 (×2): 10 mg via INTRAVENOUS
  Filled 2016-03-19 (×2): qty 1

## 2016-03-19 NOTE — Progress Notes (Signed)
  Echocardiogram 2D Echocardiogram has been performed.  Aggie Cosier 03/19/2016, 9:09 AM

## 2016-03-19 NOTE — Progress Notes (Signed)
OT Cancellation Note  Patient Details Name: DAVANEE COVERSTONE MRN: [id number] DOB: [birth year]   Cancelled Treatment:    Reason Eval/Treat Not Completed: Patient at procedure or test/ unavailable   Of note : pt actively vomiting this AM and will follow up as appropriate  Vonita Moss   OTR/L Pager: [phone] Office: [phone] .  03/19/2016, 9:01 AM

## 2016-03-19 NOTE — Progress Notes (Addendum)
PT Cancellation Note  Patient Details Name: CASMIRA MAMOLA MRN: [id number] DOB: [birth year]   Cancelled Treatment:    Reason Eval/Treat Not Completed: Patient at procedure or test/unavailable. Pt being transported to echo. Pt also actively vomiting this AM. PT to re-attempt as time allows.   Lorriane Shire 03/19/2016, 8:22 AM

## 2016-03-19 NOTE — Progress Notes (Signed)
Pt's B/P @ 2pm was 182/72, I was not notified of reading although it is charted that I was. She is in MRI right now, I will recheck it when she gets back.
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
## 2016-03-19 NOTE — Progress Notes (Signed)
Med. Now for HTN.

## 2016-03-19 NOTE — Progress Notes (Signed)
Dry heave vomited a few times earlier this morning. Says sometimes she does this at home.  Down now for Echo.
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
## 2016-03-19 NOTE — Progress Notes (Signed)
Nutrition Brief Note  Patient identified on the Malnutrition Screening Tool (MST) Report  Wt Readings from Last 15 Encounters:  03/18/16 110 lb 14.3 oz (50.3 kg)  09/10/14 137 lb (62.1 kg)  02/28/14 115 lb (52.2 kg)  05/23/12 98 lb (44.5 kg)    Body mass index is 21.66 kg/m. Patient meets criteria for healthy wt for ht based on current BMI.   Current diet order is Heart healthy. No documented intake of meals at this time.  Pt says she has lost her sense of taste. However, this is a chronic problem and likely more related to her smoking. She says she quit smoking a few years back and subsequently gained a large amount of weight from her food cravings, so she started smoking again and lost weight.   This appears to be consistent with her weight history.   She is seen eating dinner at this time. She wants to go home. Denies any complaints.   No nutrition interventions warranted at this time. If nutrition issues arise, please consult RD.   Burtis Junes RD, LDN, CNSC Clinical Nutrition Pager: [phone] 03/19/2016 5:57 PM
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
## 2016-03-20 LAB — HEMOGLOBIN A1C
HEMOGLOBIN A1C: 5.4 % (ref 4.8–5.6)
MEAN PLASMA GLUCOSE: 108 mg/dL

## 2016-03-20 MED ORDER — RISAQUAD PO CAPS
1.0000 | ORAL_CAPSULE | Freq: Every day | ORAL | Status: DC
  Administered 2016-03-20: 1 via ORAL
  Filled 2016-03-20 (×2): qty 1

## 2016-03-20 MED ORDER — AMLODIPINE BESYLATE 10 MG PO TABS
10.0000 mg | ORAL_TABLET | Freq: Every day | ORAL | Status: DC
  Administered 2016-03-20: 10 mg via ORAL
  Filled 2016-03-20: qty 1
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
## 2016-03-21 ENCOUNTER — Encounter (HOSPITAL_COMMUNITY)
Admission: EM | Disposition: A | Discharge status: Home / Self Care | Payer: Self-pay | Source: Home / Self Care | Attending: Internal Medicine

## 2016-03-21 ENCOUNTER — Encounter (HOSPITAL_COMMUNITY): Payer: Self-pay | Admitting: *Deleted

## 2016-03-21 ENCOUNTER — Inpatient Hospital Stay (HOSPITAL_COMMUNITY): Payer: Medicare Other

## 2016-03-21 DIAGNOSIS — I639 Cerebral infarction, unspecified: Secondary | ICD-10-CM

## 2016-03-21 DIAGNOSIS — I6300 Cerebral infarction due to thrombosis of unspecified precerebral artery: Secondary | ICD-10-CM

## 2016-03-21 DIAGNOSIS — I638 Other cerebral infarction: Secondary | ICD-10-CM

## 2016-03-21 HISTORY — PX: TEE WITHOUT CARDIOVERSION: SHX5443

## 2016-03-21 HISTORY — PX: LOOP RECORDER INSERTION: EP1214

## 2016-03-21 SURGERY — ECHOCARDIOGRAM, TRANSESOPHAGEAL
Anesthesia: Moderate Sedation

## 2016-03-21 SURGERY — LOOP RECORDER INSERTION

## 2016-03-21 MED ORDER — LIDOCAINE HCL (PF) 1 % IJ SOLN
INTRAMUSCULAR | Status: DC | PRN
  Administered 2016-03-21: 30 mL

## 2016-03-21 MED ORDER — BUTAMBEN-TETRACAINE-BENZOCAINE 2-2-14 % EX AERO
INHALATION_SPRAY | CUTANEOUS | Status: DC | PRN
Start: 2016-03-21 — End: 2016-03-21
  Administered 2016-03-21: 2 via TOPICAL

## 2016-03-21 MED ORDER — MIDAZOLAM HCL 10 MG/2ML IJ SOLN
INTRAMUSCULAR | Status: DC | PRN
  Administered 2016-03-21 (×2): 2 mg via INTRAVENOUS
  Administered 2016-03-21: 1 mg via INTRAVENOUS
  Administered 2016-03-21: 2 mg via INTRAVENOUS

## 2016-03-21 MED ORDER — FENTANYL CITRATE (PF) 100 MCG/2ML IJ SOLN
INTRAMUSCULAR | Status: AC
  Filled 2016-03-21: qty 2

## 2016-03-21 MED ORDER — MIDAZOLAM HCL 5 MG/ML IJ SOLN
INTRAMUSCULAR | Status: AC
Start: 2016-03-21 — End: 2016-03-21
  Filled 2016-03-21: qty 2

## 2016-03-21 MED ORDER — SENNOSIDES-DOCUSATE SODIUM 8.6-50 MG PO TABS: 1.0000 | ORAL_TABLET | Freq: Every evening | ORAL | 0 refills | Status: DC | PRN

## 2016-03-21 MED ORDER — SODIUM CHLORIDE 0.9 % IV SOLN: INTRAVENOUS | Status: DC

## 2016-03-21 MED ORDER — BOOST PLUS PO LIQD: 237.0000 mL | Freq: Two times a day (BID) | ORAL | 0 refills | Status: DC

## 2016-03-21 MED ORDER — FENTANYL CITRATE (PF) 100 MCG/2ML IJ SOLN
INTRAMUSCULAR | Status: DC | PRN
  Administered 2016-03-21 (×3): 25 ug via INTRAVENOUS

## 2016-03-21 MED ORDER — AMLODIPINE BESYLATE 10 MG PO TABS: 10.0000 mg | ORAL_TABLET | Freq: Every day | ORAL | 0 refills | Status: DC

## 2016-03-21 MED ORDER — CLOPIDOGREL BISULFATE 75 MG PO TABS: 75.0000 mg | ORAL_TABLET | Freq: Every day | ORAL | 0 refills | Status: DC

## 2016-03-21 MED ORDER — LIDOCAINE HCL (PF) 1 % IJ SOLN
INTRAMUSCULAR | Status: AC
  Filled 2016-03-21: qty 30

## 2016-03-21 SURGICAL SUPPLY — 2 items
LOOP REVEAL LINQSYS (Prosthesis & Implant Heart) ×3 IMPLANT
PACK LOOP INSERTION (CUSTOM PROCEDURE TRAY) ×3 IMPLANT
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
## 2016-03-21 NOTE — Discharge Instructions (Signed)
Keep incision clean and dry for 3 days.  You can remove outer dressing tomorrow. Leave steri-strips (little pieces of tape) for 3 days  Call the office [phone]) for redness, drainage, swelling, or fever.
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
## 2016-03-21 NOTE — Care Management Note (Signed)
Case Management Note  Patient Details  Name: [name] MRN: [id number] Date of Birth: 02/04/1945  Subjective/Objective:                 Patient was admitted with CVA. Lives at home alone. CM will follow for discharge needs pending PT/OT evals and physician orders.    Action/Plan:   Expected Discharge Date:                  Expected Discharge Plan:     In-House Referral:     Discharge planning Services     Post Acute Care Choice:    Choice offered to:     DME Arranged:    DME Agency:     HH Arranged:    HH Agency:     Status of Service:     If discussed at [employer] of Stay Meetings, dates discussed:    Additional Comments:  Rolm Baptise, RN 03/21/2016, 10:57 AM

## 2016-03-21 NOTE — Progress Notes (Signed)
PROGRESS NOTE    [name]  [id number] DOB: 26-Jan-1945 DOA: 03/18/2016 PCP: Precious Reel, MD    Brief Narrative: 71 year old admitted for stroke.   Assessment & Plan:   Active Problems:   Stroke [facility])   Right Occipital lobe infarct:  - admitted to telemetry for further evaluation.  - echocardiogram unremarkable  - lipid panel shows LDL of 73. MRI of the cervical spine unremarkable.  - started on plavix.  - no driving as per neurology recommendations.  - hgba1c pending.  -TEE as per neurology   Asthma:  No wheezing heard.    DVT prophylaxis: (Lovenox/) Code Status: (Full/ Family Communication: FRIEND AT BEDSIDE  Disposition Plan: tomorrow with home PT.    Consultants:   Neurology.    Procedures: MRI brain and MRA head and neck.   MRI cervical spine.   Antimicrobials: none.    Subjective: persistent left visual defect.   Objective: Vitals:   03/20/16 1645 03/20/16 2135 03/21/16 0054 03/21/16 0506  BP: (!) 168/88 (!) 178/75 (!) 168/72 (!) 155/70  Pulse: 76 73 75 74  Resp: 20 20 20 20   Temp: 98 F (36.7 C) 97.7 F (36.5 C) 97.6 F (36.4 C) 97.5 F (36.4 C)  TempSrc: Oral Oral Oral Oral  SpO2: 98% 98% 98% 99%  Weight:      Height:        Intake/Output Summary (Last 24 hours) at 03/21/16 0745 Last data filed at 03/20/16 1800  Gross per 24 hour  Intake              400 ml  Output                0 ml  Net              400 ml   Filed Weights   03/18/16 2113  Weight: 50.3 kg (110 lb 14.3 oz)    Examination:  General exam: Appears calm and comfortable  Respiratory system: Clear to auscultation. Respiratory effort normal. Cardiovascular system: S1 & S2 heard, RRR. No JVD, murmurs, rubs, gallops or clicks. No pedal edema. Gastrointestinal system: Abdomen is nondistended, soft and nontender. No organomegaly or masses felt. Normal bowel sounds heard. Central nervous system: Alert and oriented. No focal neurological  deficits. Extremities: Symmetric 5 x 5 power. Skin: No rashes, lesions or ulcers     Data Reviewed: I have personally reviewed following labs and imaging studies  CBC:  Recent Labs Lab 03/18/16 1201  WBC 9.2  NEUTROABS 7.0  HGB 13.1  HCT 39.7  MCV 101.8*  PLT 123XX123   Basic Metabolic Panel:  Recent Labs Lab 03/18/16 1201 03/18/16 1213  NA 136  --   K 4.4  --   CL 103  --   CO2 22  --   GLUCOSE 91  --   BUN 8  --   CREATININE 1.01* 0.90  CALCIUM 8.8*  --    GFR: Estimated Creatinine Clearance: 41.2 mL/min (by C-G formula based on SCr of 0.9 mg/dL). Liver Function Tests:  Recent Labs Lab 03/18/16 1201  AST 56*  ALT 24  ALKPHOS 80  BILITOT 0.4  PROT 5.8*  ALBUMIN 3.6   No results for input(s): LIPASE, AMYLASE in the last 168 hours. No results for input(s): AMMONIA in the last 168 hours. Coagulation Profile: No results for input(s): INR, PROTIME in the last 168 hours. Cardiac Enzymes: No results for input(s): CKTOTAL, CKMB, CKMBINDEX, TROPONINI in the last 168 hours.  BNP (last 3 results) No results for input(s): PROBNP in the last 8760 hours. HbA1C:  Recent Labs  03/19/16 0301  HGBA1C 5.4   CBG: No results for input(s): GLUCAP in the last 168 hours. Lipid Profile:  Recent Labs  03/19/16 0301  CHOL 184  HDL 87  LDLCALC 73  TRIG 121  CHOLHDL 2.1   Thyroid Function Tests: No results for input(s): TSH, T4TOTAL, FREET4, T3FREE, THYROIDAB in the last 72 hours. Anemia Panel: No results for input(s): VITAMINB12, FOLATE, FERRITIN, TIBC, IRON, RETICCTPCT in the last 72 hours. Sepsis Labs: No results for input(s): PROCALCITON, LATICACIDVEN in the last 168 hours.  No results found for this or any previous visit (from the past 240 hour(s)).       Radiology Studies: Mr Cervical Spine Wo Contrast  Result Date: 03/19/2016 CLINICAL DATA:  Hyper reflexia. EXAM: MRI CERVICAL SPINE WITHOUT CONTRAST TECHNIQUE: Multiplanar, multisequence MR imaging of  the cervical spine was performed. No intravenous contrast was administered. COMPARISON:  Cervical spine CT from yesterday FINDINGS: Alignment: Normal Vertebrae: Negative for fracture. Cord: Normal signal and morphology. Posterior Fossa, vertebral arteries, paraspinal tissues: Known right occipital and thalamic infarct. Known right vertebral occlusion. Proteinaceous Tornwaldt cyst. Disc levels: Usual degenerative changes. No cord impingement. Assessment of the foramina is limited due to motion. No focal foraminal impingement noted IMPRESSION: 1. Motion throughout the study with moderate artifact. 2. No cord compression or evidence of myelopathy. 3. Usual degenerative changes.  No suspected foraminal impingement. 4. Known right vertebral occlusion and right PCA infarct. Electronically Signed   By: Monte Fantasia M.D.   On: 03/19/2016 17:33        Scheduled Meds: . acidophilus  1 capsule Oral Daily  . amLODipine  10 mg Oral Daily  . atorvastatin  20 mg Oral QHS  . clopidogrel  75 mg Oral Daily  . enoxaparin (LOVENOX) injection  40 mg Subcutaneous Q24H  . escitalopram  20 mg Oral Daily  . lactose free nutrition  237 mL Oral BID BM   Continuous Infusions:    LOS: 2 days    Time spent: 30 minutes    Ramere Downs, MD [facility] Pager [phone] If 7PM-7AM, please contact night-coverage [email or web address] Password [email or web address] 03/21/2016, 7:45 AM

## 2016-03-21 NOTE — Progress Notes (Signed)
OT Cancellation Note  Patient Details Name: [name] MRN: [id number] DOB: 11/30/1945   Cancelled Treatment:    Reason Eval/Treat Not Completed: Patient at procedure or test/ unavailable. Pt off unit for TEE. Will check back as able.  Norman Herrlich, MS OTR/L  Pager: Dickey A Othelia Riederer 03/21/2016, 10:59 AM
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
## 2016-03-21 NOTE — CV Procedure (Signed)
Brief TEE Note  LVEF 55-60% Trivial MR, AR and PR No LA/LAA thrombus or mass No PFO or ASD by color flow Doppler or saline microcavitation study.  During this procedure the patient is administered a total of Versed 7 mg and Fentanyl 75 mcg to achieve and maintain moderate conscious sedation.  The patient's heart rate, blood pressure, and oxygen saturation are monitored continuously during the procedure. The period of conscious sedation is 23 minutes, of which I was present face-to-face 100% of this time.   Shahd Occhipinti C. Oval Linsey, MD, [facility]  03/21/2016 10:44 AM
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
## 2016-03-23 ENCOUNTER — Other Ambulatory Visit: Payer: Self-pay | Admitting: Otolaryngology

## 2016-03-24 ENCOUNTER — Other Ambulatory Visit: Payer: Self-pay

## 2016-03-28 ENCOUNTER — Other Ambulatory Visit: Payer: Self-pay

## 2016-03-28 NOTE — Patient Outreach (Signed)
[facility]) Care Management  03/28/2016  [name] 07-03-1945 [id number]   Patient triggered Red on EMMI heart failure dashboard, notification sent to [name] RN
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
## 2016-03-29 ENCOUNTER — Other Ambulatory Visit: Payer: Self-pay

## 2016-03-29 NOTE — Patient Outreach (Signed)
[facility]) Care Management  03/29/2016  AINSLEIGH KAKOS 1945-07-21 [id number]   REFERRAL DATE: 03/29/16 REFERRAL SOURCE: EMMI stroke referral/ Day #6 call  Patient contacted on yesterday 03/28/16 by [facility].   No additional follow up needed for todays date.  PLAN:  RNCM will follow up with patient at next scheduled outreach.   [name] RN,BSN,CCM [facility] Telephonic  [phone]

## 2016-03-30 ENCOUNTER — Other Ambulatory Visit: Payer: Self-pay

## 2016-03-30 NOTE — Patient Outreach (Signed)
[facility]) Care Management  03/30/2016  YANCY HASCALL [birth year] [id number]   Received voice mail message from [place] with Dr. Keane Police office inquiring about patients home health.  RNCM called [facility] and spoke with [place].  Carmell Austria stated she had received the home health order for nursing, physical therapy, occupation therapy and social worker. Carmell Austria stated patients services should start on Friday 04/01/16.  RNCM informed Carmell Austria that patient has not heard from home health agency regarding her services. RNCM requested that patient be contacted and informed of when services will start. Carmell Austria verbalized agreement and stated she would call patient today.   This RNCM called Dr. Keane Police office and was informed that [name] was off today. Requested I speak with Danae Chen who was covering.  This RNCM left voice mail message for Danae Chen informing her that [facility] has received orders for patient and services to begin on Friday 04/02/26.   This RNCM called patient to confirm she received contact from [facility].  Patient expressed gratitude for assisting her with services. Patient states she was contacted by [facility] and services would be starting on Friday 04/01/16.   PLAN:  RNCM will follow up with patient within  1 week.   [name] RN,BSN,CCM [facility] Telephonic  [phone]

## 2016-03-31 ENCOUNTER — Ambulatory Visit (INDEPENDENT_AMBULATORY_CARE_PROVIDER_SITE_OTHER): Payer: Medicare Other | Admitting: *Deleted

## 2016-03-31 DIAGNOSIS — I639 Cerebral infarction, unspecified: Secondary | ICD-10-CM

## 2016-03-31 LAB — CUP PACEART INCLINIC DEVICE CHECK
Date Time Interrogation Session: 20180315155133
MDC IDC PG IMPLANT DT: 20180305

## 2016-03-31 NOTE — Progress Notes (Signed)
Loop wound check in clinic. Incision well healed, wound edges approximated. Wound without redness, swelling or drainage. Ecchymosis noted around incision, improving. Battery status: good. R-waves 0.19-0.47mV. 0 symptom episodes, 6 tachy episodes- oversensing T waves, 6 pause episodes- undersensing, 0 brady episodes. 0 AF episodes. Reprogrammed sensitivity 0.025 mV from 0.035 mV, Blank after sense 250 ms from 150 ms, sensing threshold decay delay 300 ms from 150 ms with [name]. Monthly summary reports and ROV with WC PRN.

## 2016-04-01 ENCOUNTER — Other Ambulatory Visit: Payer: Self-pay

## 2016-04-01 NOTE — Patient Outreach (Signed)
[facility]) Care Management  04/01/2016  [name] April 21, 1945 [id number]  REFERRAL DATE: 04/01/16 REFERRAL SOURCE; EMMI stroke REFERRAL REASON: Problems with medicatons: YES  SUBJECTIVE; Telephone call with patient regarding  EMMI stroke referral red alert. HIPAA verified with patient. Patient states she has a bad gut bacteria. Patient states Dr. Cristina Gong started her on align to help with the numerous bowel movements she was having. Patient states the align was working but on yesterday she had 12 bowel movements.  Patient inquiring if she should increase her align.  RNCM advised patient to call Dr. Osborn Coho office today and report the increased bowel movements.  RNCM requested that patient have doctors office advise her on dosing of the align. Patient denies having any blood in stool or black tar colored stool.  Patient denies any abdominal pain.  Patient verbalized understanding and states she will call doctors office and report.  PLAN: RNCM will follow up with patient at next scheduled outreach.   [name] RN,BSN,CCM [facility] Telephonic  [phone]

## 2016-04-04 ENCOUNTER — Other Ambulatory Visit: Payer: Self-pay

## 2016-04-04 NOTE — Patient Outreach (Signed)
[facility]) Care Management  04/04/2016  TIERRE GERARD 05/29/1945 [id number]  REFERRAL DATE: 03/24/16 REFERRAL SOURCE: EMMI stroke referral REFERRAL REASON:  ongoing EMMI stroke follow up CONSENT: Patient verbally agreed to [facility] stroke follow up with RNCM.  SUBJECTIVE: Telephone call to patient for EMMI stroke follow up. HIPAA verified with patient. Patient states she followed up with her primary MD office on Friday 03/29/16.  Patient states her doctor instructed her to continue to take the align stating it takes a little time for it to start working.  Patient states on Saturday 03/30/16 she only had 6 bowel movements. Patient states she is continuing to take the align as prescribed. Patient denies any pain.  Patient state her legs are bothering her where the cuts are on her chins.  Patient states the lumps that were in her legs have turned hard. Patient states her ankles and legs swell up at night. Patient reports her doctor is aware of the symptoms she is having  with her legs. Patient states the occupational therapist is starting to work with her from home health and will be following up with her again this week.  RNCM reviewed stroke signs/ symptoms .Advised patient to call 911 for stroke like symptoms.  RNCM gave patient contact information for neurologist, Dr. Leonie Man.  Advised to call office and set up follow up appointment. Patient verbalized understanding.  Patient denies any additional needs at this time.  Patient agreed to next telephone follow up with RNCM.   ASSESSMENT: ongoing follow up for EMMI stroke program.  PLAN; RNCM will follow up with patient within 1 week.   [name] RN,BSN,CCM [facility] Telephonic  [phone]

## 2016-04-12 ENCOUNTER — Ambulatory Visit: Payer: Self-pay

## 2016-04-13 ENCOUNTER — Other Ambulatory Visit: Payer: Self-pay

## 2016-04-13 NOTE — Patient Outreach (Signed)
[facility]) Care Management  04/13/2016  PARMINDER TRAPANI [birth year] [id number]  EMMI stroke closure Initial referral date; 03/24/16  PROVIDERS;  Dr. Cristina Gong Dr. Katy Fitch Dr. Leonie Man  Telephone call to patient regarding  EMMI stroke follow up / closure. HIPAA verified with patient. Patient denies any new symptoms. Patient reports at times her vision becomes more clear but then becomes cloudy again. Patient states her eyes water.  She reports her next follow up appointment with her eye doctor is 05/04/16.  Patient states the industry of the blind saw her for an evaluation on yesterday. Patient reports they will provide her with a larger calendar.  Patient reports the cut areas on her legs are healing.  Patient states the scabs are starting to come off. She denies any redness, swelling, drainage or fever like symptoms. RNCM advised patient to report any of these symptoms to her doctor. Patient reports the soreness in her legs to be a 2 pain level. Patient states the lumps in her legs are improving. Patient states she continues to work with her therapist through home health . Patient reports she continues to have approximately 5 bowel movements per day. Patient states she felt like yesterday was a better day with not as many bowel movements. Patient states she continues to take her probiotics as prescribed by her doctor. Patient states her next follow up visit with her primary MD is 04/26/16.   Patient states she has a follow up appointment scheduled with the neurologist on 05/09/16. Patient states she is scheduled for allergy testing on 04/14/16. Patient states she has transportation to her doctor appointments and has friends that check on.  RNCM reviewed signs symptoms of stroke with patient. Advised to call 911 for stroke like symptoms. RNCM advised patient to report any signs of abdominal pain, fever to doctor. Patient states she is aware of how to contact her doctor after hours.    Patient verbally agreed to closure to [facility] stroke program due to no further needs.   PLAN; RNCM will refer patient to care management assistant to close due to patient being assessed and having no further needs.   [name] RN,BSN,CCM [facility] Telephonic  [phone]

## 2016-05-09 ENCOUNTER — Ambulatory Visit (INDEPENDENT_AMBULATORY_CARE_PROVIDER_SITE_OTHER): Payer: Medicare Other | Admitting: Neurology

## 2016-05-09 ENCOUNTER — Encounter: Payer: Self-pay | Admitting: Neurology

## 2016-05-09 VITALS — BP 131/79 | HR 74 | Ht 60.0 in | Wt 103.8 lb

## 2016-05-09 DIAGNOSIS — I6621 Occlusion and stenosis of right posterior cerebral artery: Secondary | ICD-10-CM | POA: Diagnosis not present
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
## 2016-05-09 NOTE — Patient Instructions (Signed)
I had a long d/w patient about his recent stroke, risk for recurrent stroke/TIAs, personally independently reviewed imaging studies and stroke evaluation results and answered questions.Continue Plavix  for secondary stroke prevention and maintain strict control of hypertension with blood pressure goal below 130/90, diabetes with hemoglobin A1c goal below 6.5% and lipids with LDL cholesterol goal below 70 mg/dL. I also advised the patient to eat a healthy diet with plenty of whole grains, cereals, fruits and vegetables, exercise regularly and maintain ideal body weight .I also advised the patient to start taking fish oil daily as well as participate in mentally challenging activities like solving crossword puzzles, sudoku and word searches to help with her memory loss. Followup in the future with my nurse practitioner in 6 months or call earlier if necessary  Stroke Prevention Some medical conditions and behaviors are associated with an increased chance of having a stroke. You may prevent a stroke by making healthy choices and managing medical conditions. How can I reduce my risk of having a stroke?  Stay physically active. Get at least 30 minutes of activity on most or all days.  Do not smoke. It may also be helpful to avoid exposure to secondhand smoke.  Limit alcohol use. Moderate alcohol use is considered to be:  No more than 2 drinks per day for men.  No more than 1 drink per day for nonpregnant women.  Eat healthy foods. This involves:  Eating 5 or more servings of fruits and vegetables a day.  Making dietary changes that address high blood pressure (hypertension), high cholesterol, diabetes, or obesity.  Manage your cholesterol levels.  Making food choices that are high in fiber and low in saturated fat, trans fat, and cholesterol may control cholesterol levels.  Take any prescribed medicines to control cholesterol as directed by your health care provider.  Manage your  diabetes.  Controlling your carbohydrate and sugar intake is recommended to manage diabetes.  Take any prescribed medicines to control diabetes as directed by your health care provider.  Control your hypertension.  Making food choices that are low in salt (sodium), saturated fat, trans fat, and cholesterol is recommended to manage hypertension.  Ask your health care provider if you need treatment to lower your blood pressure. Take any prescribed medicines to control hypertension as directed by your health care provider.  If you are 68-54 years of age, have your blood pressure checked every 3-5 years. If you are 22 years of age or older, have your blood pressure checked every year.  Maintain a healthy weight.  Reducing calorie intake and making food choices that are low in sodium, saturated fat, trans fat, and cholesterol are recommended to manage weight.  Stop drug abuse.  Avoid taking birth control pills.  Talk to your health care provider about the risks of taking birth control pills if you are over 68 years old, smoke, get migraines, or have ever had a blood clot.  Get evaluated for sleep disorders (sleep apnea).  Talk to your health care provider about getting a sleep evaluation if you snore a lot or have excessive sleepiness.  Take medicines only as directed by your health care provider.  For some people, aspirin or blood thinners (anticoagulants) are helpful in reducing the risk of forming abnormal blood clots that can lead to stroke. If you have the irregular heart rhythm of atrial fibrillation, you should be on a blood thinner unless there is a good reason you cannot take them.  Understand all your medicine  instructions.  Make sure that other conditions (such as anemia or atherosclerosis) are addressed. Get help right away if:  You have sudden weakness or numbness of the face, arm, or leg, especially on one side of the body.  Your face or eyelid droops to one  side.  You have sudden confusion.  You have trouble speaking (aphasia) or understanding.  You have sudden trouble seeing in one or both eyes.  You have sudden trouble walking.  You have dizziness.  You have a loss of balance or coordination.  You have a sudden, severe headache with no known cause.  You have new chest pain or an irregular heartbeat. Any of these symptoms may represent a serious problem that is an emergency. Do not wait to see if the symptoms will go away. Get medical help at once. Call your local emergency services (911 in [place] Do not drive yourself to the hospital. This information is not intended to replace advice given to you by your health care provider. Make sure you discuss any questions you have with your health care provider. Document Released: 02/11/2004 Document Revised: 06/11/2015 Document Reviewed: 07/06/2012 Elsevier Interactive Patient Education  2017 [employer].

## 2016-05-16 ENCOUNTER — Other Ambulatory Visit (HOSPITAL_COMMUNITY): Payer: Self-pay | Admitting: Otolaryngology

## 2016-05-16 DIAGNOSIS — C028 Malignant neoplasm of overlapping sites of tongue: Secondary | ICD-10-CM

## 2016-05-25 ENCOUNTER — Ambulatory Visit (HOSPITAL_COMMUNITY)
Admission: RE | Admit: 2016-05-25 | Discharge: 2016-05-25 | Disposition: A | Discharge status: Home / Self Care | Payer: Medicare Other | Source: Ambulatory Visit | Attending: Otolaryngology | Admitting: Otolaryngology

## 2016-05-25 DIAGNOSIS — C028 Malignant neoplasm of overlapping sites of tongue: Secondary | ICD-10-CM | POA: Diagnosis not present

## 2016-05-25 DIAGNOSIS — I700 Atherosclerosis of aorta: Secondary | ICD-10-CM | POA: Insufficient documentation

## 2016-05-25 LAB — GLUCOSE, CAPILLARY: Glucose-Capillary: 90 mg/dL (ref 65–99)

## 2016-05-25 MED ORDER — FLUDEOXYGLUCOSE F - 18 (FDG) INJECTION
6.6200 | Freq: Once | INTRAVENOUS | Status: AC | PRN
  Administered 2016-05-25: 6.62 via INTRAVENOUS

## 2016-06-16 ENCOUNTER — Telehealth: Payer: Self-pay

## 2016-06-16 NOTE — Telephone Encounter (Signed)
Rn receive call from Larrie Kass at Dr. Erik Obey ENT office. Amy stated patient is schedule to have a  Wide excision left tongue cancer with direct laryngoscopy on 06/29/2016. Pts surgeon will be back in the office on 06/27/2016. She wanted to know how many days pt can be off plavix.  Rn stated patient had stroke in March 2018.  Pt will be admitted to [facility] and will be staying overnight for the procedure.Rn stated Dr. Leonie Man will return to office on 06/20/2016. Rn stated a message will be sent to Dr. Leonie Man to evaluate and review patients chart. Amy stated a contact number is [phone].

## 2016-06-20 NOTE — Telephone Encounter (Signed)
Patient had a recent stroke in March 2018 and remains at risk for recurrent stroke or TIA with maximum risk in the first 3-6 months. The patient will have to hold her antiplatelet agents due to impending tongue cancer surgery but the risk-benefit of the surgery is overall in favor as long as patient understands this and is willing

## 2016-06-21 NOTE — Telephone Encounter (Signed)
Clearance letter fax twice to Amy at Dr. Archie Patten doctor at [phone]. Fax receive and confirmed.

## 2016-06-21 NOTE — Telephone Encounter (Signed)
Rn call Amy at Dr. Erik Obey office about Dr Leonie Man wrote a note about the clearance for patient. Rn stated cone is part of epic and the notes can be seen in the computer. Amy stated she can see the office note from Dr. Leonie Man, but not phone note. Rn stated Dr. Leonie Man did write pt is high risk for another stroke. Rn stated per Dr.Sethi plavx is on hold for 5 days prior to procedure, and is at maximum risk because of having stroke in March 2018. Rn stated Dr. Leonie Man will write letter and it will be fax over. Rn was given fax number of [phone].

## 2016-06-27 ENCOUNTER — Encounter (HOSPITAL_COMMUNITY)
Admission: RE | Admit: 2016-06-27 | Discharge: 2016-06-27 | Disposition: A | Discharge status: Home / Self Care | Payer: Medicare Other | Source: Ambulatory Visit | Attending: Otolaryngology | Admitting: Otolaryngology

## 2016-06-27 ENCOUNTER — Encounter (HOSPITAL_COMMUNITY): Payer: Self-pay

## 2016-06-27 DIAGNOSIS — Z79899 Other long term (current) drug therapy: Secondary | ICD-10-CM | POA: Diagnosis not present

## 2016-06-27 DIAGNOSIS — Z885 Allergy status to narcotic agent status: Secondary | ICD-10-CM | POA: Diagnosis not present

## 2016-06-27 DIAGNOSIS — J45909 Unspecified asthma, uncomplicated: Secondary | ICD-10-CM | POA: Diagnosis not present

## 2016-06-27 DIAGNOSIS — I739 Peripheral vascular disease, unspecified: Secondary | ICD-10-CM | POA: Diagnosis not present

## 2016-06-27 DIAGNOSIS — I10 Essential (primary) hypertension: Secondary | ICD-10-CM | POA: Diagnosis not present

## 2016-06-27 DIAGNOSIS — C029 Malignant neoplasm of tongue, unspecified: Secondary | ICD-10-CM | POA: Diagnosis not present

## 2016-06-27 DIAGNOSIS — I69398 Other sequelae of cerebral infarction: Secondary | ICD-10-CM | POA: Diagnosis not present

## 2016-06-27 DIAGNOSIS — Z7902 Long term (current) use of antithrombotics/antiplatelets: Secondary | ICD-10-CM | POA: Diagnosis not present

## 2016-06-27 DIAGNOSIS — Z87891 Personal history of nicotine dependence: Secondary | ICD-10-CM | POA: Diagnosis not present

## 2016-06-27 DIAGNOSIS — H539 Unspecified visual disturbance: Secondary | ICD-10-CM | POA: Diagnosis not present

## 2016-06-27 DIAGNOSIS — Z7951 Long term (current) use of inhaled steroids: Secondary | ICD-10-CM | POA: Diagnosis not present

## 2016-06-27 DIAGNOSIS — G4730 Sleep apnea, unspecified: Secondary | ICD-10-CM | POA: Diagnosis not present

## 2016-06-27 DIAGNOSIS — F329 Major depressive disorder, single episode, unspecified: Secondary | ICD-10-CM | POA: Diagnosis not present

## 2016-06-27 HISTORY — DX: Malignant (primary) neoplasm, unspecified: C80.1

## 2016-06-27 HISTORY — DX: Pneumonia, unspecified organism: J18.9

## 2016-06-27 HISTORY — DX: Peripheral vascular disease, unspecified: I73.9

## 2016-06-27 LAB — BASIC METABOLIC PANEL WITH GFR
Anion gap: 9 (ref 5–15)
BUN: 21 mg/dL — ABNORMAL HIGH (ref 6–20)
CO2: 26 mmol/L (ref 22–32)
Calcium: 9.6 mg/dL (ref 8.9–10.3)
Chloride: 102 mmol/L (ref 101–111)
Creatinine, Ser: 1.17 mg/dL — ABNORMAL HIGH (ref 0.44–1.00)
GFR calc Af Amer: 53 mL/min — ABNORMAL LOW
GFR calc non Af Amer: 45 mL/min — ABNORMAL LOW
Glucose, Bld: 106 mg/dL — ABNORMAL HIGH (ref 65–99)
Potassium: 4.2 mmol/L (ref 3.5–5.1)
Sodium: 137 mmol/L (ref 135–145)

## 2016-06-27 LAB — CBC
HCT: 39.1 % (ref 36.0–46.0)
Hemoglobin: 12.7 g/dL (ref 12.0–15.0)
MCH: 30.7 pg (ref 26.0–34.0)
MCHC: 32.5 g/dL (ref 30.0–36.0)
MCV: 94.4 fL (ref 78.0–100.0)
Platelets: 251 K/uL (ref 150–400)
RBC: 4.14 MIL/uL (ref 3.87–5.11)
RDW: 12.9 % (ref 11.5–15.5)
WBC: 12.1 K/uL — ABNORMAL HIGH (ref 4.0–10.5)

## 2016-06-27 NOTE — Pre-Procedure Instructions (Signed)
[name]  06/27/2016      [id number] - [place], [place] AT [place] [address] [place] [address] Phone: [phone] Fax: [place] [address], [place] [address] Phone: [phone] Fax: [phone]    Your procedure is scheduled on Wednesday, June 29, 2016.  Report to [facility] Admitting at [place].M.  Call this number if you have problems the morning of surgery:  [phone]   Remember:  Do not eat food or drink liquids after midnight.  Take these medicines the morning of surgery with A SIP OF WATER amlodipine (Norvasc), escitalopram (Lexapro), and if needed: albuterol nebulizer, azelastine (Astelin) nasal spray, budesonide-formoterol (Symbicort) inhaler  7 days prior to surgery STOP taking any Aspirin, Aleve, Naproxen, Ibuprofen, Motrin, Advil, Goody's, BC's, all herbal medications, fish oil, and all vitamins  Special Instructions:  [place]- Preparing For Surgery  Before surgery, you can play an important role. Because skin is not sterile, your skin needs to be as free of germs as possible. You can reduce the number of germs on your skin by washing with CHG (chlorahexidine gluconate) Soap before surgery.  CHG is an antiseptic cleaner which kills germs and bonds with the skin to continue killing germs even after washing.  Please do not use if you have an allergy to CHG or antibacterial soaps. If your skin becomes reddened/irritated stop using the CHG.  Do not shave (including legs and underarms) for at least 48 hours prior to first CHG shower. It is OK to shave your face.  Please follow these instructions carefully.   1. Shower the NIGHT BEFORE SURGERY and the MORNING OF SURGERY with CHG.   2. If you chose to wash your hair, wash your hair first as usual with your normal shampoo.  3. After  you shampoo, rinse your hair and body thoroughly to remove the shampoo.  4. Use CHG as you would any other liquid soap. You can apply CHG directly to the skin and wash gently with a scrungie or a clean washcloth.   5. Apply the CHG Soap to your body ONLY FROM THE NECK DOWN.  Do not use on open wounds or open sores. Avoid contact with your eyes, ears, mouth and genitals (private parts). Wash genitals (private parts) with your normal soap.  6. Wash thoroughly, paying special attention to the area where your surgery will be performed.  7. Thoroughly rinse your body with warm water from the neck down.  8. DO NOT shower/wash with your normal soap after using and rinsing off the CHG Soap.  9. Pat yourself dry with a CLEAN TOWEL.   10. Wear CLEAN PAJAMAS   11. Place CLEAN SHEETS on your bed the night of your first shower and DO NOT SLEEP WITH PETS.    Day of Surgery: Do not apply any deodorants/lotions. Please wear clean clothes to the hospital/surgery center.      Do not wear jewelry, make-up or nail polish.  Do not wear lotions, powders, or perfumes, or deodorant.  Do not shave 48 hours prior to surgery.  Men may shave face and neck.  Do not bring valuables to the hospital.  [facility] is not responsible for any belongings or valuables.  Contacts, dentures or bridgework may not be worn into surgery.  Leave your suitcase in the car.  After surgery it may be brought to your room.  For patients admitted to the hospital, discharge time will be determined by your treatment team.  Patients discharged the day of surgery will not be allowed to drive home.   Name and phone number of your driver:     Please read over the following fact sheets that you were given. Pain Booklet and Surgical Site Infection Prevention

## 2016-06-27 NOTE — Progress Notes (Signed)
REQUESTED NEURO CLEARANCE FROM AMY AT [facility].

## 2016-06-28 NOTE — Anesthesia Preprocedure Evaluation (Addendum)
Anesthesia Evaluation  Patient identified by MRN, date of birth, ID band Patient awake    Reviewed: Allergy & Precautions, NPO status , Patient's Chart, lab work & pertinent test results  History of Anesthesia Complications Negative for: history of anesthetic complications  Airway Mallampati: I  TM Distance: >3 FB Neck ROM: Full    Dental  (+) Upper Dentures, Dental Advisory Given, Implants   Pulmonary sleep apnea (does not require CPAP) , COPD (has not needed inhaler in months),  COPD inhaler, former smoker (recently quit),    breath sounds clear to auscultation       Cardiovascular hypertension, Pt. on medications (-) angina Rhythm:Regular Rate:Normal  3/18 ECHO: EF 55-60%, valves OK   Neuro/Psych Depression CVA (vision deficit L eye), Residual Symptoms    GI/Hepatic negative GI ROS, Neg liver ROS,   Endo/Other  negative endocrine ROS  Renal/GU negative Renal ROS     Musculoskeletal   Abdominal   Peds  Hematology Off of Plavix since 6/4   Anesthesia Other Findings Advanced tongue cancer  Reproductive/Obstetrics                            Anesthesia Physical Anesthesia Plan  ASA: III  Anesthesia Plan: General   Post-op Pain Management:    Induction: Intravenous  PONV Risk Score and Plan: 3 and Ondansetron, Dexamethasone, Propofol and Midazolam  Airway Management Planned: Oral ETT and Video Laryngoscope Planned  Additional Equipment:   Intra-op Plan:   Post-operative Plan: Extubation in OR  Informed Consent: I have reviewed the patients History and Physical, chart, labs and discussed the procedure including the risks, benefits and alternatives for the proposed anesthesia with the patient or authorized representative who has indicated his/her understanding and acceptance.   Dental advisory given  Plan Discussed with: CRNA and Surgeon  Anesthesia Plan Comments: (Plan routine  monitors, GETA with VideoGlide intubation)        Anesthesia Quick Evaluation
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
## 2016-06-29 ENCOUNTER — Encounter (HOSPITAL_COMMUNITY)
Admission: RE | Disposition: A | Discharge status: Home / Self Care | Payer: Self-pay | Source: Ambulatory Visit | Attending: Otolaryngology

## 2016-06-29 ENCOUNTER — Ambulatory Visit (HOSPITAL_COMMUNITY): Payer: Medicare Other | Admitting: Anesthesiology

## 2016-06-29 ENCOUNTER — Encounter (HOSPITAL_COMMUNITY): Payer: Self-pay | Admitting: Anesthesiology

## 2016-06-29 ENCOUNTER — Ambulatory Visit (HOSPITAL_COMMUNITY): Payer: Medicare Other | Admitting: Vascular Surgery

## 2016-06-29 ENCOUNTER — Ambulatory Visit (HOSPITAL_COMMUNITY)
Admission: RE | Admit: 2016-06-29 | Discharge: 2016-06-29 | Disposition: A | Discharge status: Home / Self Care | Payer: Medicare Other | Source: Ambulatory Visit | Attending: Otolaryngology | Admitting: Otolaryngology

## 2016-06-29 DIAGNOSIS — Z7951 Long term (current) use of inhaled steroids: Secondary | ICD-10-CM | POA: Insufficient documentation

## 2016-06-29 DIAGNOSIS — Z7902 Long term (current) use of antithrombotics/antiplatelets: Secondary | ICD-10-CM | POA: Insufficient documentation

## 2016-06-29 DIAGNOSIS — Z87891 Personal history of nicotine dependence: Secondary | ICD-10-CM | POA: Insufficient documentation

## 2016-06-29 DIAGNOSIS — I10 Essential (primary) hypertension: Secondary | ICD-10-CM | POA: Insufficient documentation

## 2016-06-29 DIAGNOSIS — F329 Major depressive disorder, single episode, unspecified: Secondary | ICD-10-CM | POA: Diagnosis not present

## 2016-06-29 DIAGNOSIS — C029 Malignant neoplasm of tongue, unspecified: Secondary | ICD-10-CM | POA: Insufficient documentation

## 2016-06-29 DIAGNOSIS — I739 Peripheral vascular disease, unspecified: Secondary | ICD-10-CM | POA: Diagnosis not present

## 2016-06-29 DIAGNOSIS — Z79899 Other long term (current) drug therapy: Secondary | ICD-10-CM | POA: Insufficient documentation

## 2016-06-29 DIAGNOSIS — Z885 Allergy status to narcotic agent status: Secondary | ICD-10-CM | POA: Insufficient documentation

## 2016-06-29 DIAGNOSIS — G4730 Sleep apnea, unspecified: Secondary | ICD-10-CM | POA: Insufficient documentation

## 2016-06-29 DIAGNOSIS — H539 Unspecified visual disturbance: Secondary | ICD-10-CM | POA: Insufficient documentation

## 2016-06-29 DIAGNOSIS — J45909 Unspecified asthma, uncomplicated: Secondary | ICD-10-CM | POA: Insufficient documentation

## 2016-06-29 DIAGNOSIS — I69398 Other sequelae of cerebral infarction: Secondary | ICD-10-CM | POA: Insufficient documentation

## 2016-06-29 HISTORY — PX: ESOPHAGOSCOPY: SHX5534

## 2016-06-29 HISTORY — PX: DIRECT LARYNGOSCOPY: SHX5326

## 2016-06-29 SURGERY — LARYNGOSCOPY, DIRECT
Anesthesia: General

## 2016-06-29 MED ORDER — LACTATED RINGERS IV SOLN
INTRAVENOUS | Status: DC | PRN
Start: 2016-06-29 — End: 2016-06-29
  Administered 2016-06-29: 08:00:00 via INTRAVENOUS

## 2016-06-29 MED ORDER — PROPOFOL 10 MG/ML IV BOLUS
INTRAVENOUS | Status: AC
  Filled 2016-06-29: qty 20

## 2016-06-29 MED ORDER — DEXAMETHASONE SODIUM PHOSPHATE 10 MG/ML IJ SOLN
6.0000 mg | Freq: Once | INTRAMUSCULAR | Status: AC
  Administered 2016-06-29: 6 mg via INTRAVENOUS

## 2016-06-29 MED ORDER — EPINEPHRINE HCL (NASAL) 0.1 % NA SOLN
NASAL | Status: AC
  Filled 2016-06-29: qty 30

## 2016-06-29 MED ORDER — TRIAMCINOLONE ACETONIDE 40 MG/ML IJ SUSP
INTRAMUSCULAR | Status: AC
  Filled 2016-06-29: qty 5

## 2016-06-29 MED ORDER — PROMETHAZINE HCL 25 MG/ML IJ SOLN: 6.2500 mg | INTRAMUSCULAR | Status: DC | PRN

## 2016-06-29 MED ORDER — HYDROCODONE-ACETAMINOPHEN 7.5-325 MG/15ML PO SOLN
5.0000 mL | ORAL | Status: DC | PRN
  Administered 2016-06-29: 5 mL via ORAL

## 2016-06-29 MED ORDER — ONDANSETRON HCL 4 MG/2ML IJ SOLN
INTRAMUSCULAR | Status: AC
  Filled 2016-06-29: qty 2

## 2016-06-29 MED ORDER — PHENYLEPHRINE HCL 10 MG/ML IJ SOLN
INTRAVENOUS | Status: DC | PRN
  Administered 2016-06-29: 50 ug/min via INTRAVENOUS

## 2016-06-29 MED ORDER — MIDAZOLAM HCL 2 MG/2ML IJ SOLN
INTRAMUSCULAR | Status: AC
  Filled 2016-06-29: qty 2

## 2016-06-29 MED ORDER — FENTANYL CITRATE (PF) 100 MCG/2ML IJ SOLN
INTRAMUSCULAR | Status: DC | PRN
  Administered 2016-06-29: 100 ug via INTRAVENOUS
  Administered 2016-06-29: 50 ug via INTRAVENOUS

## 2016-06-29 MED ORDER — OXYMETAZOLINE HCL 0.05 % NA SOLN
NASAL | Status: AC
  Filled 2016-06-29: qty 15

## 2016-06-29 MED ORDER — ONDANSETRON HCL 4 MG/2ML IJ SOLN
INTRAMUSCULAR | Status: DC | PRN
  Administered 2016-06-29: 4 mg via INTRAVENOUS

## 2016-06-29 MED ORDER — EPINEPHRINE PF 1 MG/ML IJ SOLN
INTRAMUSCULAR | Status: AC
  Filled 2016-06-29: qty 1

## 2016-06-29 MED ORDER — FENTANYL CITRATE (PF) 250 MCG/5ML IJ SOLN
INTRAMUSCULAR | Status: AC
  Filled 2016-06-29: qty 5

## 2016-06-29 MED ORDER — 0.9 % SODIUM CHLORIDE (POUR BTL) OPTIME
TOPICAL | Status: DC | PRN
  Administered 2016-06-29: 1000 mL

## 2016-06-29 MED ORDER — ROCURONIUM BROMIDE 100 MG/10ML IV SOLN
INTRAVENOUS | Status: DC | PRN
  Administered 2016-06-29: 50 mg via INTRAVENOUS

## 2016-06-29 MED ORDER — OXYMETAZOLINE HCL 0.05 % NA SOLN
NASAL | Status: DC | PRN
  Administered 2016-06-29: 3 via NASAL

## 2016-06-29 MED ORDER — HYDROCODONE-ACETAMINOPHEN 7.5-325 MG/15ML PO SOLN
ORAL | Status: AC
  Administered 2016-06-29: 5 mL via ORAL
  Filled 2016-06-29: qty 15

## 2016-06-29 MED ORDER — CEFAZOLIN SODIUM-DEXTROSE 2-4 GM/100ML-% IV SOLN
2.0000 g | INTRAVENOUS | Status: AC
  Administered 2016-06-29: 2 g via INTRAVENOUS

## 2016-06-29 MED ORDER — FENTANYL CITRATE (PF) 100 MCG/2ML IJ SOLN: 25.0000 ug | INTRAMUSCULAR | Status: DC | PRN

## 2016-06-29 MED ORDER — SUGAMMADEX SODIUM 200 MG/2ML IV SOLN
INTRAVENOUS | Status: AC
Start: 2016-06-29 — End: 2016-06-29
  Filled 2016-06-29: qty 2

## 2016-06-29 MED ORDER — DEXAMETHASONE SODIUM PHOSPHATE 10 MG/ML IJ SOLN
INTRAMUSCULAR | Status: DC | PRN
  Administered 2016-06-29: 10 mg via INTRAVENOUS

## 2016-06-29 MED ORDER — PHENYLEPHRINE 40 MCG/ML (10ML) SYRINGE FOR IV PUSH (FOR BLOOD PRESSURE SUPPORT)
PREFILLED_SYRINGE | INTRAVENOUS | Status: AC
  Filled 2016-06-29: qty 10

## 2016-06-29 MED ORDER — CEFAZOLIN SODIUM-DEXTROSE 2-4 GM/100ML-% IV SOLN
INTRAVENOUS | Status: AC
  Filled 2016-06-29: qty 100

## 2016-06-29 MED ORDER — PROPOFOL 10 MG/ML IV BOLUS
INTRAVENOUS | Status: DC | PRN
  Administered 2016-06-29: 100 mg via INTRAVENOUS

## 2016-06-29 MED ORDER — LIDOCAINE HCL (CARDIAC) 20 MG/ML IV SOLN
INTRAVENOUS | Status: DC | PRN
  Administered 2016-06-29: 40 mg via INTRAVENOUS

## 2016-06-29 MED ORDER — MEPERIDINE HCL 25 MG/ML IJ SOLN: 6.2500 mg | INTRAMUSCULAR | Status: DC | PRN

## 2016-06-29 MED ORDER — DEXAMETHASONE SODIUM PHOSPHATE 10 MG/ML IJ SOLN
INTRAMUSCULAR | Status: AC
  Filled 2016-06-29: qty 1

## 2016-06-29 MED ORDER — SUGAMMADEX SODIUM 200 MG/2ML IV SOLN
INTRAVENOUS | Status: DC | PRN
  Administered 2016-06-29: 200 mg via INTRAVENOUS

## 2016-06-29 MED ORDER — DEXAMETHASONE SODIUM PHOSPHATE 10 MG/ML IJ SOLN
INTRAMUSCULAR | Status: AC
  Administered 2016-06-29: 6 mg via INTRAVENOUS
  Filled 2016-06-29: qty 1

## 2016-06-29 MED ORDER — MIDAZOLAM HCL 2 MG/2ML IJ SOLN
INTRAMUSCULAR | Status: DC | PRN
  Administered 2016-06-29: 1 mg via INTRAVENOUS

## 2016-06-29 MED ORDER — LIDOCAINE 2% (20 MG/ML) 5 ML SYRINGE
INTRAMUSCULAR | Status: AC
  Filled 2016-06-29: qty 5

## 2016-06-29 MED ORDER — MIDAZOLAM HCL 2 MG/2ML IJ SOLN: 0.5000 mg | Freq: Once | INTRAMUSCULAR | Status: DC | PRN

## 2016-06-29 MED ORDER — PHENYLEPHRINE HCL 10 MG/ML IJ SOLN
INTRAMUSCULAR | Status: DC | PRN
  Administered 2016-06-29 (×2): 80 ug via INTRAVENOUS

## 2016-06-29 MED ORDER — ROCURONIUM BROMIDE 10 MG/ML (PF) SYRINGE
PREFILLED_SYRINGE | INTRAVENOUS | Status: AC
Start: 2016-06-29 — End: 2016-06-29
  Filled 2016-06-29: qty 5

## 2016-06-29 SURGICAL SUPPLY — 39 items
BALLN PULM 15 16.5 18 X 75CM (BALLOONS)
BALLN PULM 15 16.5 18X75 (BALLOONS)
BALLOON PULM 15 16.5 18X75 (BALLOONS) IMPLANT
BLADE SURG 15 STRL LF DISP TIS (BLADE) IMPLANT
BLADE SURG 15 STRL SS (BLADE)
CANISTER SUCT 1200ML W/VALVE (MISCELLANEOUS) ×4 IMPLANT
CONT SPEC 4OZ CLIKSEAL STRL BL (MISCELLANEOUS) IMPLANT
COVER BACK TABLE 60X90IN (DRAPES) ×4 IMPLANT
COVER MAYO STAND STRL (DRAPES) ×4 IMPLANT
CRADLE DONUT ADULT HEAD (MISCELLANEOUS) IMPLANT
DRAPE HALF SHEET 40X57 (DRAPES) ×4 IMPLANT
GLOVE ECLIPSE 8.0 STRL XLNG CF (GLOVE) ×4 IMPLANT
GOWN BRE IMP SLV AUR LG STRL (GOWN DISPOSABLE) IMPLANT
GOWN STRL REUS W/ TWL LRG LVL3 (GOWN DISPOSABLE) IMPLANT
GOWN STRL REUS W/ TWL XL LVL3 (GOWN DISPOSABLE) IMPLANT
GOWN STRL REUS W/TWL LRG LVL3 (GOWN DISPOSABLE)
GOWN STRL REUS W/TWL XL LVL3 (GOWN DISPOSABLE)
GUARD TEETH (MISCELLANEOUS) ×4 IMPLANT
KIT BASIN OR (CUSTOM PROCEDURE TRAY) ×4 IMPLANT
KIT ROOM TURNOVER OR (KITS) IMPLANT
MARKER SKIN DUAL TIP RULER LAB (MISCELLANEOUS) IMPLANT
NEEDLE HYPO 18GX1.5 BLUNT FILL (NEEDLE) IMPLANT
NEEDLE HYPO 25GX1X1/2 BEV (NEEDLE) IMPLANT
NS IRRIG 1000ML POUR BTL (IV SOLUTION) ×4 IMPLANT
PAD ARMBOARD 7.5X6 YLW CONV (MISCELLANEOUS) IMPLANT
PATTIES SURGICAL .5 X3 (DISPOSABLE) ×4 IMPLANT
SOLUTION ANTI FOG 6CC (MISCELLANEOUS) IMPLANT
SPONGE GAUZE 4X4 12PLY STER LF (GAUZE/BANDAGES/DRESSINGS) ×8 IMPLANT
SURGILUBE 2OZ TUBE FLIPTOP (MISCELLANEOUS) ×4 IMPLANT
SUT SILK 2 0 FS (SUTURE) ×8 IMPLANT
SUT VIC AB 3-0 FS2 27 (SUTURE) ×4 IMPLANT
SYR 5ML LL (SYRINGE) IMPLANT
SYR CONTROL 10ML LL (SYRINGE) IMPLANT
SYR INFLATE BILIARY GAUGE (MISCELLANEOUS) IMPLANT
TOWEL OR 17X24 6PK STRL BLUE (TOWEL DISPOSABLE) ×4 IMPLANT
TUBE CONNECTING 12'X1/4 (SUCTIONS) ×1
TUBE CONNECTING 12X1/4 (SUCTIONS) ×3 IMPLANT
TUBE CONNECTING 20'X1/4 (TUBING) ×1
TUBE CONNECTING 20X1/4 (TUBING) ×3 IMPLANT

## 2016-06-29 NOTE — Anesthesia Procedure Notes (Signed)
Procedure Name: Intubation Date/Time: 06/29/2016 9:02 AM Performed by: Rush Farmer E Pre-anesthesia Checklist: Patient identified, Emergency Drugs available, Suction available and Patient being monitored Patient Re-evaluated:Patient Re-evaluated prior to inductionOxygen Delivery Method: Circle system utilized Preoxygenation: Pre-oxygenation with 100% oxygen Intubation Type: IV induction and Inhalational induction Ventilation: Mask ventilation without difficulty and Oral airway inserted - appropriate to patient size Tube type: Oral Tube size: 6.0 mm Number of attempts: 1 Airway Equipment and Method: Stylet Placement Confirmation: ETT inserted through vocal cords under direct vision,  positive ETCO2 and breath sounds checked- equal and bilateral Secured at: 20 cm Tube secured with: Tape Dental Injury: Teeth and Oropharynx as per pre-operative assessment  Comments: DL with rigid bronchoscope per Dr. Erik Obey, 6.0 ETT inserted per Dr. Erik Obey.

## 2016-06-29 NOTE — Transfer of Care (Signed)
Immediate Anesthesia Transfer of Care Note  Patient: [name]  Procedure(s) Performed: Procedure(s): WIDE EXCISION LEFT TONGUE CANCER DIRECT LARYNGOSCOPY (Left) ESOPHAGOSCOPY/BRONCHOSCOPY (N/A)  Patient Location: PACU  Anesthesia Type:General  Level of Consciousness: awake, alert  and oriented  Airway & Oxygen Therapy: Patient Spontanous Breathing and Patient connected to face mask oxygen  Post-op Assessment: Report given to RN, Post -op Vital signs reviewed and stable and Patient moving all extremities X 4  Post vital signs: Reviewed and stable  Last Vitals:  Vitals:   06/29/16 1034 06/29/16 1035  BP:    Pulse: (!) 101 100  Resp: (!) 25 (!) 23  Temp: 36.7 C     Last Pain:  Vitals:   06/29/16 0634  TempSrc: Oral      Patients Stated Pain Goal: 3 (65/79/03 8333)  Complications: No apparent anesthesia complications

## 2016-06-29 NOTE — Anesthesia Postprocedure Evaluation (Signed)
Anesthesia Post Note  Patient: DRUSILLA WAMPOLE  Procedure(s) Performed: Procedure(s) (LRB): WIDE EXCISION LEFT TONGUE CANCER DIRECT LARYNGOSCOPY (Left) ESOPHAGOSCOPY/BRONCHOSCOPY (N/A)     Patient location during evaluation: PACU Anesthesia Type: General Level of consciousness: awake and alert, oriented and patient cooperative Pain management: pain level controlled Vital Signs Assessment: post-procedure vital signs reviewed and stable Respiratory status: spontaneous breathing, nonlabored ventilation and respiratory function stable Cardiovascular status: blood pressure returned to baseline and stable Postop Assessment: no signs of nausea or vomiting Anesthetic complications: no    Last Vitals:  Vitals:   06/29/16 1300 06/29/16 1309  BP:  140/79  Pulse: 92   Resp: 18   Temp: 37.3 C     Last Pain:  Vitals:   06/29/16 1300  TempSrc:   PainSc: 7                  Wileen Duncanson,E. Yareth Macdonnell

## 2016-06-29 NOTE — Op Note (Signed)
06/29/2016  10:31 AM    [name]  [id number]   Pre-Op Dx:  T1N0M0 SCCa LEFT oral tongue  Post-op Dx: same  Proc:  Direct Laryngoscopy,  Esophagoscopy,  Bronchoscopy, wide local excision LEFT lateral tongue lesion.   Surg:  Tyson Alias MD  Anes:  GOT  EBL:  10 ml  Comp:  none  Findings:  Slightly firm ulceration, 1 cm, LEFT postero-lateral oral tongue.  Firm 1 cm LEFT level II node.   Procedure: With the patient in a comfortable supine position, General mask anesthesia was induced without difficulty.  At an appropriate level, the table was turned 90 degrees away from Anesthesia.  A clean preparation and draping was performed in the standard fashion.  A surgical time out was obtained in the standard fashion.  A rubber tooth guard was placed.     Using the [facility] laryngoscope, the larynx was visualized.  The rod bronchoscope was passed through the glottis and into the tracheobronchial tree.  inspection down into the mainstem bronchus on each side was performed with the findings as described above.  The bronchoscope and laryngoscope were removed.  The anterior commissure laryngoscope was introduced taking care to protect lips, teeth, and endotracheal tube.  Complete laryngoscopy was performed in the standard fashion.  The findings were as described above.  The laryngoscope was removed.  The cervical esophagoscope was lubricated and inserted into the hypopharynx.  With gentle pressure, it was passed through the cricopharyngeus and advanced to its full length without difficulty with the findings as described above.  It was removed.  Direct visualization of the nasopharynx was negative.  The oropharynx, oral cavity, nasopharynx and hypopharynx were palpated with findings as described above.  The neck was palpated on both sides with the findings as described above.  A towel clip was used to pull the tongue forward.  The findings were as described above.   A 10 mm  margin was marked with the cautery around the lesion.  Working from anteriorly, then inferiorly, then medially, then posteriorly, the lesion was resected with a 10 mm margin.  Hemostasis was spontaneous.  The primary specimen was marked with silk sutures for orientation.  A frozen section margin was obtained at the circumvallate papillae.  This returned with suspicious atypical cells.  A second frozen section was obtained deep in the same area and marked for orientation.  This returned as benign.  No further excision was required.  The defect was inspected.  The pharynx was rinsed with cool NS.  The wound was closed with interrupted 3-0 vicryl suture in simple and vertical mattress techniques.  Hemostasis was observed.  The pharynx was irrigated and suctioned clear again.   Dental status was intact.  The procedure was completed.   The patient was returned to anesthesia, awakened, extubated, and transferred to PACU in satisfactory condition.    Dispo:   PACU to home  Plan:  Ice, elevation, analgesia.  Today she had one small palpable node, but there was no activity in the neck on PET scan recently.  We will observe this closely.    Tyson Alias MD

## 2016-06-29 NOTE — H&P (Signed)
[name], [place] 71 y.o., female [id number]     Chief Complaint: LEFT tongue cancer  HPI: 71 yo wf, long smoking hx.  eval for LEFT tongue pain 2 years ago with no findings.  Recent lesion noted by DDS, biopsy showed well diff SCCa.  No symptoms.  PET/CT positive at LEFT tongue, sl positive LEFT NP and OP.  No neck nodes or distant mets.  PMH: Past Medical History:  Diagnosis Date  . Asthma   . Cancer ([place])   . Depression   . Hypertension   . Peripheral vascular disease ([place])   . Pneumonia   . Sleep apnea   . Stroke [facility])     Surg Hx: Past Surgical History:  Procedure Laterality Date  . EYE SURGERY Bilateral 2016  . KNEE SURGERY Right 2014  . LOOP RECORDER INSERTION N/A 03/21/2016   Procedure: Loop Recorder Insertion;  Surgeon: Melissa Meredith Leeds, MD;  Location: [place] CV [facility];  Service: Cardiovascular;  Laterality: N/A;  . PARS PLANA VITRECTOMY Right 02/28/2014   Procedure: PARS PLANA VITRECTOMY 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 WITH TAP AND ANTIBIOTIC INJECTION;  Surgeon: Melissa Horn, MD;  Location: [place];  Service: Ophthalmology;  Laterality: Right;  . TEE WITHOUT CARDIOVERSION N/A 03/21/2016   Procedure: TRANSESOPHAGEAL ECHOCARDIOGRAM (TEE);  Surgeon: Melissa Latch, MD;  Location: [facility] ENDOSCOPY;  Service: Cardiovascular;  Laterality: N/A;    FHx:   Family History  Problem Relation Age of Onset  . Cancer Mother        colon  . Stroke Father   . Heart disease Brother    SocHx:  reports that she quit smoking about 4 weeks ago. She has never used smokeless tobacco. She reports that she does not drink alcohol or use drugs.  ALLERGIES:  Allergies  Allergen Reactions  . Codeine Itching    Mouth broke out  . Neosporin [Neomycin-Bacitracin Zn-Polymyx] Itching and Swelling    swelling at application site    Medications Prior to Admission  Medication Sig Dispense Refill  . albuterol (PROVENTIL) (2.5 MG/3ML) 0.083% nebulizer solution Take 2.5 mg by nebulization every  6 (six) hours as needed for wheezing or shortness of breath.    [name] amLODipine (NORVASC) 10 MG tablet Take 1 tablet (10 mg total) by mouth daily. 30 tablet 0  . atorvastatin (LIPITOR) 10 MG tablet TAKE 1 TABLET (10 MG) DAILY IN THE MORNING.  3  . azelastine (ASTELIN) 0.1 % nasal spray Place 1 spray into both nostrils 2 (two) times daily as needed for allergies. Use in each nostril as directed    . budesonide-formoterol (SYMBICORT) 80-4.5 MCG/ACT inhaler Inhale 2 puffs into the lungs 2 (two) times daily as needed (FOR RESPIRATORY ISSUES.).    [name] Cholecalciferol (VITAMIN D) 2000 units CAPS Take 2,000 Units by mouth daily.    . clopidogrel (PLAVIX) 75 MG tablet Take 1 tablet (75 mg total) by mouth daily. 30 tablet 0  . escitalopram (LEXAPRO) 20 MG tablet Take 10 mg by mouth daily.   5  . Melatonin 3 MG CAPS Take 3 mg by mouth at bedtime as needed (for sleep.).     [name] Omega-3 Fatty Acids (FISH OIL) 1200 MG CAPS Take 1,200 mg by mouth daily.      Results for orders placed or performed during the hospital encounter of 06/27/16 (from the past 48 hour(s))  CBC     Status: Abnormal   Collection Time: 06/27/16  1:21 PM  Result Value Ref Range   WBC  12.1 (H) 4.0 - 10.5 K/uL   RBC 4.14 3.87 - 5.11 MIL/uL   Hemoglobin 12.7 12.0 - 15.0 g/dL   HCT 39.1 36.0 - 46.0 %   MCV 94.4 78.0 - 100.0 fL   MCH 30.7 26.0 - 34.0 pg   MCHC 32.5 30.0 - 36.0 g/dL   RDW 12.9 11.5 - 15.5 %   Platelets 251 150 - 400 K/uL  Basic metabolic panel     Status: Abnormal   Collection Time: 06/27/16  1:21 PM  Result Value Ref Range   Sodium 137 135 - 145 mmol/L   Potassium 4.2 3.5 - 5.1 mmol/L   Chloride 102 101 - 111 mmol/L   CO2 26 22 - 32 mmol/L   Glucose, Bld 106 (H) 65 - 99 mg/dL   BUN 21 (H) 6 - 20 mg/dL   Creatinine, Ser 1.17 (H) 0.44 - 1.00 mg/dL   Calcium 9.6 8.9 - 10.3 mg/dL   GFR calc non Af Amer 45 (L) >60 mL/min   GFR calc Af Amer 53 (L) >60 mL/min    Comment: (NOTE) The eGFR has been calculated using the CKD  EPI equation. This calculation has not been validated in all clinical situations. eGFR's persistently <60 mL/min signify possible Chronic Kidney Disease.    Anion gap 9 5 - 15   No results found.  ROS:  recent CVA with vision issues  Blood pressure (!) 161/65, pulse 77, temperature 98.2 F (36.8 C), temperature source Oral, resp. rate 18, weight 44 kg (97 lb), SpO2 100 %.  PHYSICAL EXAM: Overall appearance:  Thin, anxious Head: NCAT Ears:  clear Nose:  clear Oral Cavity:  Moist.  Full fixed upper plate.  Small ulcer LEFT posterolateral oral tongue. Oral Pharynx/Hypopharynx/Larynx:  clear Neuro: grossly intact x vision Neck:  No nodes  Studies Reviewed:PET/CT    Assessment/Plan T1N0M0 SCCa LEFT oral tongue.  Plan:  Panendoscopy.  Wide excision tongue lesion.  [name] 1/51/8343, 8:24 AM

## 2016-06-29 NOTE — Discharge Instructions (Signed)
Ice pack to neck x 24 hrs as tolerated.  Keep head elevated 3-4 nights.  Rinse mouth with cool dilute salt water every few hours and after meals.  OK to brush teeth gently.  Diet as comfortable (emphasize hydration over nutrition for the first few days).  Call for excessive pain, any breathing difficulty, bleeding or signs of infection.  Recheck my office 1 week.

## 2016-06-30 ENCOUNTER — Encounter (HOSPITAL_COMMUNITY): Payer: Self-pay | Admitting: Otolaryngology

## 2016-11-08 ENCOUNTER — Ambulatory Visit (INDEPENDENT_AMBULATORY_CARE_PROVIDER_SITE_OTHER): Payer: Medicare Other | Admitting: Adult Health

## 2016-11-08 ENCOUNTER — Encounter: Payer: Self-pay | Admitting: Adult Health

## 2016-11-08 VITALS — BP 138/67 | HR 86 | Ht 60.0 in | Wt 111.8 lb

## 2016-11-08 DIAGNOSIS — Z8673 Personal history of transient ischemic attack (TIA), and cerebral infarction without residual deficits: Secondary | ICD-10-CM | POA: Diagnosis not present

## 2016-11-08 DIAGNOSIS — H53462 Homonymous bilateral field defects, left side: Secondary | ICD-10-CM

## 2016-11-08 MED ORDER — ASPIRIN 81 MG PO TABS: 81.0000 mg | ORAL_TABLET | Freq: Every day | ORAL | 5 refills | Status: DC

## 2016-11-08 NOTE — Patient Instructions (Signed)
Your Plan:  Stop plavix. Start Aspirin 81 mg daily  BP <130/90 Cholesterol LDL <70 HgbA1c <6.5 % If your symptoms worsen or you develop new symptoms please let us know.   Thank you for coming to see [place] at [facility]. I hope we have been able to provide you high quality care today.  You may receive a patient satisfaction survey over the next few weeks. We would appreciate your feedback and comments so that we may continue to improve ourselves and the health of our patients.
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
## 2016-11-09 NOTE — Progress Notes (Signed)
I agree with the above plan 

## 2016-12-13 ENCOUNTER — Other Ambulatory Visit (HOSPITAL_COMMUNITY): Payer: Self-pay | Admitting: *Deleted

## 2016-12-14 ENCOUNTER — Ambulatory Visit (HOSPITAL_COMMUNITY)
Admission: RE | Admit: 2016-12-14 | Discharge: 2016-12-14 | Disposition: A | Discharge status: Home / Self Care | Payer: Medicare Other | Source: Ambulatory Visit | Attending: Internal Medicine | Admitting: Internal Medicine

## 2016-12-14 DIAGNOSIS — M810 Age-related osteoporosis without current pathological fracture: Secondary | ICD-10-CM | POA: Diagnosis not present

## 2016-12-14 MED ORDER — DENOSUMAB 60 MG/ML ~~LOC~~ SOLN
60.0000 mg | Freq: Once | SUBCUTANEOUS | Status: AC
  Administered 2016-12-14: 60 mg via SUBCUTANEOUS
  Filled 2016-12-14 (×2): qty 1

## 2016-12-14 NOTE — Discharge Instructions (Signed)
Denosumab injection °What is this medicine? °DENOSUMAB (den oh sue mab) slows bone breakdown. Prolia is used to treat osteoporosis in women after menopause and in men. Xgeva is used to treat a high calcium level due to cancer and to prevent bone fractures and other bone problems caused by multiple myeloma or cancer bone metastases. Xgeva is also used to treat giant cell tumor of the bone. °This medicine may be used for other purposes; ask your health care provider or pharmacist if you have questions. °COMMON BRAND NAME(S): Prolia, XGEVA °What should I tell my health care provider before I take this medicine? °They need to know if you have any of these conditions: °-dental disease °-having surgery or tooth extraction °-infection °-kidney disease °-low levels of calcium or Vitamin D in the blood °-malnutrition °-on hemodialysis °-skin conditions or sensitivity °-thyroid or parathyroid disease °-an unusual reaction to denosumab, other medicines, foods, dyes, or preservatives °-pregnant or trying to get pregnant °-breast-feeding °How should I use this medicine? °This medicine is for injection under the skin. It is given by a health care professional in a hospital or clinic setting. °If you are getting Prolia, a special MedGuide will be given to you by the pharmacist with each prescription and refill. Be sure to read this information carefully each time. °For Prolia, talk to your pediatrician regarding the use of this medicine in children. Special care may be needed. For Xgeva, talk to your pediatrician regarding the use of this medicine in children. While this drug may be prescribed for children as young as 13 years for selected conditions, precautions do apply. °Overdosage: If you think you have taken too much of this medicine contact a poison control center or emergency room at once. °NOTE: This medicine is only for you. Do not share this medicine with others. °What if I miss a dose? °It is important not to miss your  dose. Call your doctor or health care professional if you are unable to keep an appointment. °What may interact with this medicine? °Do not take this medicine with any of the following medications: °-other medicines containing denosumab °This medicine may also interact with the following medications: °-medicines that lower your chance of fighting infection °-steroid medicines like prednisone or cortisone °This list may not describe all possible interactions. Give your health care provider a list of all the medicines, herbs, non-prescription drugs, or dietary supplements you use. Also tell them if you smoke, drink alcohol, or use illegal drugs. Some items may interact with your medicine. °What should I watch for while using this medicine? °Visit your doctor or health care professional for regular checks on your progress. Your doctor or health care professional may order blood tests and other tests to see how you are doing. °Call your doctor or health care professional for advice if you get a fever, chills or sore throat, or other symptoms of a cold or flu. Do not treat yourself. This drug may decrease your body's ability to fight infection. Try to avoid being around people who are sick. °You should make sure you get enough calcium and vitamin D while you are taking this medicine, unless your doctor tells you not to. Discuss the foods you eat and the vitamins you take with your health care professional. °See your dentist regularly. Brush and floss your teeth as directed. Before you have any dental work done, tell your dentist you are receiving this medicine. °Do not become pregnant while taking this medicine or for 5 months after stopping   it. Talk with your doctor or health care professional about your birth control options while taking this medicine. Women should inform their doctor if they wish to become pregnant or think they might be pregnant. There is a potential for serious side effects to an unborn child. Talk  to your health care professional or pharmacist for more information. What side effects may I notice from receiving this medicine? Side effects that you should report to your doctor or health care professional as soon as possible: -allergic reactions like skin rash, itching or hives, swelling of the face, lips, or tongue -bone pain -breathing problems -dizziness -jaw pain, especially after dental work -redness, blistering, peeling of the skin -signs and symptoms of infection like fever or chills; cough; sore throat; pain or trouble passing urine -signs of low calcium like fast heartbeat, muscle cramps or muscle pain; pain, tingling, numbness in the hands or feet; seizures -unusual bleeding or bruising -unusually weak or tired Side effects that usually do not require medical attention (report to your doctor or health care professional if they continue or are bothersome): -constipation -diarrhea -headache -joint pain -loss of appetite -muscle pain -runny nose -tiredness -upset stomach This list may not describe all possible side effects. Call your doctor for medical advice about side effects. You may report side effects to FDA at 1-800-FDA-1088. Where should I keep my medicine? This medicine is only given in a clinic, doctor's office, or other health care setting and will not be stored at home. NOTE: This sheet is a summary. It may not cover all possible information. If you have questions about this medicine, talk to your doctor, pharmacist, or health care provider.  2018 Elsevier/Gold Standard (2016-01-26 19:17:21)

## 2017-05-09 ENCOUNTER — Ambulatory Visit: Payer: Medicare Other | Admitting: Adult Health

## 2017-07-27 ENCOUNTER — Other Ambulatory Visit (HOSPITAL_COMMUNITY): Payer: Self-pay | Admitting: *Deleted

## 2017-07-28 ENCOUNTER — Ambulatory Visit (HOSPITAL_COMMUNITY)
Admission: RE | Admit: 2017-07-28 | Discharge: 2017-07-28 | Disposition: A | Discharge status: Home / Self Care | Payer: Medicare Other | Source: Ambulatory Visit | Attending: Internal Medicine | Admitting: Internal Medicine

## 2017-07-28 DIAGNOSIS — M810 Age-related osteoporosis without current pathological fracture: Secondary | ICD-10-CM | POA: Diagnosis not present

## 2017-07-28 MED ORDER — DENOSUMAB 60 MG/ML ~~LOC~~ SOSY
60.0000 mg | PREFILLED_SYRINGE | Freq: Once | SUBCUTANEOUS | Status: AC
  Administered 2017-07-28: 60 mg via SUBCUTANEOUS
  Filled 2017-07-28: qty 1

## 2017-08-07 ENCOUNTER — Ambulatory Visit (INDEPENDENT_AMBULATORY_CARE_PROVIDER_SITE_OTHER): Payer: Medicare Other | Admitting: *Deleted

## 2017-08-07 DIAGNOSIS — I639 Cerebral infarction, unspecified: Secondary | ICD-10-CM

## 2017-08-08 NOTE — Progress Notes (Signed)
[facility] Summary Report / Loop Recorder 

## 2017-09-11 ENCOUNTER — Ambulatory Visit (INDEPENDENT_AMBULATORY_CARE_PROVIDER_SITE_OTHER): Payer: Medicare Other | Admitting: *Deleted

## 2017-09-11 DIAGNOSIS — I639 Cerebral infarction, unspecified: Secondary | ICD-10-CM

## 2017-09-11 NOTE — Progress Notes (Signed)
[facility] Summary Report / Loop Recorder 

## 2017-09-22 LAB — CUP PACEART REMOTE DEVICE CHECK
Implantable Pulse Generator Implant Date: 20180305
MDC IDC SESS DTM: 20190906141721

## 2017-10-10 LAB — CUP PACEART REMOTE DEVICE CHECK
Implantable Pulse Generator Implant Date: 20180305
MDC IDC SESS DTM: 20190824200817

## 2017-10-12 ENCOUNTER — Ambulatory Visit (INDEPENDENT_AMBULATORY_CARE_PROVIDER_SITE_OTHER): Payer: Medicare Other | Admitting: *Deleted

## 2017-10-12 DIAGNOSIS — I639 Cerebral infarction, unspecified: Secondary | ICD-10-CM

## 2017-10-13 NOTE — Progress Notes (Signed)
[facility] Summary Report / Loop Recorder 

## 2017-10-16 LAB — CUP PACEART REMOTE DEVICE CHECK
Date Time Interrogation Session: 20190926221000
Implantable Pulse Generator Implant Date: 20180305

## 2017-11-14 ENCOUNTER — Ambulatory Visit (INDEPENDENT_AMBULATORY_CARE_PROVIDER_SITE_OTHER): Payer: Medicare Other | Admitting: *Deleted

## 2017-11-14 DIAGNOSIS — I639 Cerebral infarction, unspecified: Secondary | ICD-10-CM | POA: Diagnosis not present

## 2017-11-15 NOTE — Progress Notes (Signed)
[facility] Summary Report / Loop Recorder 

## 2017-12-08 LAB — CUP PACEART REMOTE DEVICE CHECK
Date Time Interrogation Session: 20191029223952
MDC IDC PG IMPLANT DT: 20180305

## 2017-12-18 ENCOUNTER — Ambulatory Visit (INDEPENDENT_AMBULATORY_CARE_PROVIDER_SITE_OTHER): Payer: Medicare Other

## 2017-12-18 DIAGNOSIS — I639 Cerebral infarction, unspecified: Secondary | ICD-10-CM

## 2017-12-18 NOTE — Progress Notes (Signed)
[facility] Summary Report / Loop Recorder 

## 2018-01-15 LAB — CUP PACEART REMOTE DEVICE CHECK
Date Time Interrogation Session: 20191201224006
MDC IDC PG IMPLANT DT: 20180305

## 2018-01-19 ENCOUNTER — Ambulatory Visit (INDEPENDENT_AMBULATORY_CARE_PROVIDER_SITE_OTHER): Payer: Medicare Other

## 2018-01-19 DIAGNOSIS — I639 Cerebral infarction, unspecified: Secondary | ICD-10-CM | POA: Diagnosis not present

## 2018-01-19 LAB — CUP PACEART REMOTE DEVICE CHECK
Date Time Interrogation Session: 20200103210249
Implantable Pulse Generator Implant Date: 20180305

## 2018-01-22 NOTE — Progress Notes (Signed)
[facility] Summary Report / Loop Recorder 
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
## 2018-02-21 ENCOUNTER — Ambulatory Visit (INDEPENDENT_AMBULATORY_CARE_PROVIDER_SITE_OTHER): Payer: Medicare Other

## 2018-02-21 DIAGNOSIS — I639 Cerebral infarction, unspecified: Secondary | ICD-10-CM | POA: Diagnosis not present

## 2018-02-23 LAB — CUP PACEART REMOTE DEVICE CHECK
Date Time Interrogation Session: 20200205230812
MDC IDC PG IMPLANT DT: 20180305

## 2018-03-02 NOTE — Progress Notes (Signed)
[facility] Summary Report / Loop Recorder 

## 2018-03-26 ENCOUNTER — Encounter: Payer: Medicare Other | Admitting: *Deleted

## 2018-03-26 ENCOUNTER — Ambulatory Visit (INDEPENDENT_AMBULATORY_CARE_PROVIDER_SITE_OTHER): Payer: Medicare Other | Admitting: *Deleted

## 2018-03-26 DIAGNOSIS — I639 Cerebral infarction, unspecified: Secondary | ICD-10-CM

## 2018-03-26 LAB — CUP PACEART INCLINIC DEVICE CHECK
Date Time Interrogation Session: 20200309161703
MDC IDC PG IMPLANT DT: 20180305

## 2018-03-26 NOTE — Progress Notes (Signed)
Device Clinic loop recorder check. Battery status: good. R-waves 0.42mV. No symptom or brady episodes. 445 tachy episodes--false, TWOS. 17 pause episodes--false, undersensing. 1 AF episode--false, artifact and TWOS. Changes per WC: sensing threshold decay delay extended to 457ms to cover T-waves, tachy detection rate increased to 176bpm for 48 beats, pause and brady detection turned off (implanted for cryptogenic stroke). Pt educated about [employer] monitor and will send manual transmission to update programming. Monthly summary reports and ROV with WC PRN.

## 2018-03-27 ENCOUNTER — Telehealth: Payer: Self-pay

## 2018-03-27 NOTE — Telephone Encounter (Signed)
I left a message for pt to send a manual transmission with her home remote monitor.

## 2018-03-28 NOTE — Telephone Encounter (Signed)
Spoke w/ pt and she informed me that when she tries to send a manual transmission it instructs her what to do but during the first green bar the screen goes blacks / turns off. Called tech support for guidance. They requested that pt call them. Provided pt w/ the number and she is going to call tech support.

## 2018-04-12 NOTE — Telephone Encounter (Signed)
Spoke w/ pt and instructed her how to send a manual transmission w/ her home monitor. Transmission received.  

## 2018-04-12 NOTE — Telephone Encounter (Signed)
Transmission received, monitor now connected  Chanetta Marshall, NP 04/12/2018 1:40 PM

## 2018-04-30 ENCOUNTER — Ambulatory Visit (INDEPENDENT_AMBULATORY_CARE_PROVIDER_SITE_OTHER): Payer: Medicare Other | Admitting: *Deleted

## 2018-04-30 ENCOUNTER — Other Ambulatory Visit: Payer: Self-pay

## 2018-04-30 DIAGNOSIS — I639 Cerebral infarction, unspecified: Secondary | ICD-10-CM

## 2018-04-30 LAB — CUP PACEART REMOTE DEVICE CHECK
Date Time Interrogation Session: 20200412000754
Implantable Pulse Generator Implant Date: 20180305

## 2018-05-04 ENCOUNTER — Other Ambulatory Visit: Payer: Self-pay

## 2018-05-04 ENCOUNTER — Inpatient Hospital Stay (HOSPITAL_COMMUNITY)
Admission: EM | Admit: 2018-05-04 | Discharge: 2018-05-09 | DRG: 065 | Disposition: A | Discharge status: Skilled Nursing Facility | Payer: Medicare Other | Attending: Internal Medicine | Admitting: Internal Medicine

## 2018-05-04 ENCOUNTER — Encounter (HOSPITAL_COMMUNITY): Payer: Self-pay | Admitting: Emergency Medicine

## 2018-05-04 ENCOUNTER — Observation Stay (HOSPITAL_COMMUNITY): Payer: Medicare Other

## 2018-05-04 ENCOUNTER — Emergency Department (HOSPITAL_COMMUNITY): Payer: Medicare Other

## 2018-05-04 DIAGNOSIS — Z79899 Other long term (current) drug therapy: Secondary | ICD-10-CM

## 2018-05-04 DIAGNOSIS — Z79890 Hormone replacement therapy (postmenopausal): Secondary | ICD-10-CM

## 2018-05-04 DIAGNOSIS — R482 Apraxia: Secondary | ICD-10-CM | POA: Diagnosis present

## 2018-05-04 DIAGNOSIS — Z883 Allergy status to other anti-infective agents status: Secondary | ICD-10-CM

## 2018-05-04 DIAGNOSIS — I10 Essential (primary) hypertension: Secondary | ICD-10-CM | POA: Diagnosis present

## 2018-05-04 DIAGNOSIS — Z8249 Family history of ischemic heart disease and other diseases of the circulatory system: Secondary | ICD-10-CM

## 2018-05-04 DIAGNOSIS — Z85810 Personal history of malignant neoplasm of tongue: Secondary | ICD-10-CM

## 2018-05-04 DIAGNOSIS — Z7951 Long term (current) use of inhaled steroids: Secondary | ICD-10-CM

## 2018-05-04 DIAGNOSIS — I63412 Cerebral infarction due to embolism of left middle cerebral artery: Principal | ICD-10-CM | POA: Diagnosis present

## 2018-05-04 DIAGNOSIS — F329 Major depressive disorder, single episode, unspecified: Secondary | ICD-10-CM | POA: Diagnosis present

## 2018-05-04 DIAGNOSIS — Z823 Family history of stroke: Secondary | ICD-10-CM

## 2018-05-04 DIAGNOSIS — H53462 Homonymous bilateral field defects, left side: Secondary | ICD-10-CM | POA: Diagnosis present

## 2018-05-04 DIAGNOSIS — R402142 Coma scale, eyes open, spontaneous, at arrival to emergency department: Secondary | ICD-10-CM | POA: Diagnosis present

## 2018-05-04 DIAGNOSIS — R0902 Hypoxemia: Secondary | ICD-10-CM | POA: Diagnosis not present

## 2018-05-04 DIAGNOSIS — J449 Chronic obstructive pulmonary disease, unspecified: Secondary | ICD-10-CM | POA: Diagnosis present

## 2018-05-04 DIAGNOSIS — D72829 Elevated white blood cell count, unspecified: Secondary | ICD-10-CM | POA: Diagnosis present

## 2018-05-04 DIAGNOSIS — R402252 Coma scale, best verbal response, oriented, at arrival to emergency department: Secondary | ICD-10-CM | POA: Diagnosis present

## 2018-05-04 DIAGNOSIS — R402362 Coma scale, best motor response, obeys commands, at arrival to emergency department: Secondary | ICD-10-CM | POA: Diagnosis present

## 2018-05-04 DIAGNOSIS — I639 Cerebral infarction, unspecified: Secondary | ICD-10-CM | POA: Diagnosis not present

## 2018-05-04 DIAGNOSIS — Z95818 Presence of other cardiac implants and grafts: Secondary | ICD-10-CM

## 2018-05-04 DIAGNOSIS — F32A Depression, unspecified: Secondary | ICD-10-CM

## 2018-05-04 DIAGNOSIS — G8191 Hemiplegia, unspecified affecting right dominant side: Secondary | ICD-10-CM | POA: Diagnosis present

## 2018-05-04 DIAGNOSIS — R4701 Aphasia: Secondary | ICD-10-CM | POA: Diagnosis present

## 2018-05-04 DIAGNOSIS — E785 Hyperlipidemia, unspecified: Secondary | ICD-10-CM | POA: Diagnosis present

## 2018-05-04 DIAGNOSIS — G4733 Obstructive sleep apnea (adult) (pediatric): Secondary | ICD-10-CM | POA: Diagnosis present

## 2018-05-04 DIAGNOSIS — I69398 Other sequelae of cerebral infarction: Secondary | ICD-10-CM

## 2018-05-04 DIAGNOSIS — I739 Peripheral vascular disease, unspecified: Secondary | ICD-10-CM | POA: Diagnosis present

## 2018-05-04 DIAGNOSIS — J4489 Other specified chronic obstructive pulmonary disease: Secondary | ICD-10-CM

## 2018-05-04 DIAGNOSIS — Z8673 Personal history of transient ischemic attack (TIA), and cerebral infarction without residual deficits: Secondary | ICD-10-CM | POA: Diagnosis not present

## 2018-05-04 DIAGNOSIS — Z87891 Personal history of nicotine dependence: Secondary | ICD-10-CM

## 2018-05-04 DIAGNOSIS — R7303 Prediabetes: Secondary | ICD-10-CM | POA: Diagnosis present

## 2018-05-04 DIAGNOSIS — R2681 Unsteadiness on feet: Secondary | ICD-10-CM | POA: Diagnosis present

## 2018-05-04 DIAGNOSIS — Z7982 Long term (current) use of aspirin: Secondary | ICD-10-CM

## 2018-05-04 DIAGNOSIS — Z885 Allergy status to narcotic agent status: Secondary | ICD-10-CM

## 2018-05-04 LAB — COMPREHENSIVE METABOLIC PANEL
ALT: 14 U/L (ref 0–44)
AST: 23 U/L (ref 15–41)
Albumin: 4.2 g/dL (ref 3.5–5.0)
Alkaline Phosphatase: 72 U/L (ref 38–126)
Anion gap: 12 (ref 5–15)
BUN: 17 mg/dL (ref 8–23)
CO2: 22 mmol/L (ref 22–32)
Calcium: 9.2 mg/dL (ref 8.9–10.3)
Chloride: 104 mmol/L (ref 98–111)
Creatinine, Ser: 0.99 mg/dL (ref 0.44–1.00)
GFR calc Af Amer: >60 mL/min (ref >60)
GFR calc non Af Amer: 57 mL/min — ABNORMAL LOW (ref >60)
Glucose, Bld: 108 mg/dL — ABNORMAL HIGH (ref 70–99)
Potassium: 3.9 mmol/L (ref 3.5–5.1)
Sodium: 138 mmol/L (ref 135–145)
Total Bilirubin: 0.6 mg/dL (ref 0.3–1.2)
Total Protein: 6.9 g/dL (ref 6.5–8.1)

## 2018-05-04 LAB — CBC
HCT: 43.7 % (ref 36.0–46.0)
Hemoglobin: 14.1 g/dL (ref 12.0–15.0)
MCH: 30.6 pg (ref 26.0–34.0)
MCHC: 32.3 g/dL (ref 30.0–36.0)
MCV: 94.8 fL (ref 80.0–100.0)
Platelets: 214 10*3/uL (ref 150–400)
RBC: 4.61 MIL/uL (ref 3.87–5.11)
RDW: 13.4 % (ref 11.5–15.5)
WBC: 13.6 10*3/uL — ABNORMAL HIGH (ref 4.0–10.5)
nRBC: 0 % (ref 0.0–0.2)

## 2018-05-04 LAB — DIFFERENTIAL
Abs Immature Granulocytes: 0.04 10*3/uL (ref 0.00–0.07)
Basophils Absolute: 0.1 10*3/uL (ref 0.0–0.1)
Basophils Relative: 1 %
Eosinophils Absolute: 0.2 10*3/uL (ref 0.0–0.5)
Eosinophils Relative: 2 %
Immature Granulocytes: 0 %
Lymphocytes Relative: 13 %
Lymphs Abs: 1.8 10*3/uL (ref 0.7–4.0)
Monocytes Absolute: 0.7 10*3/uL (ref 0.1–1.0)
Monocytes Relative: 5 %
Neutro Abs: 10.8 10*3/uL — ABNORMAL HIGH (ref 1.7–7.7)
Neutrophils Relative %: 79 %

## 2018-05-04 LAB — CBG MONITORING, ED: Glucose-Capillary: 122 mg/dL — ABNORMAL HIGH (ref 70–99)

## 2018-05-04 LAB — PROTIME-INR
INR: 1 (ref 0.8–1.2)
Prothrombin Time: 12.8 seconds (ref 11.4–15.2)

## 2018-05-04 LAB — HEMOGLOBIN A1C
Hgb A1c MFr Bld: 6 % — ABNORMAL HIGH (ref 4.8–5.6)
Mean Plasma Glucose: 125.5 mg/dL

## 2018-05-04 LAB — I-STAT CREATININE, ED: Creatinine, Ser: 0.8 mg/dL (ref 0.44–1.00)

## 2018-05-04 LAB — APTT: aPTT: 32 seconds (ref 24–36)

## 2018-05-04 MED ORDER — ASPIRIN 300 MG RE SUPP: 300.0000 mg | Freq: Every day | RECTAL | Status: DC

## 2018-05-04 MED ORDER — MOMETASONE FURO-FORMOTEROL FUM 100-5 MCG/ACT IN AERO
2.0000 | INHALATION_SPRAY | Freq: Two times a day (BID) | RESPIRATORY_TRACT | Status: DC
  Administered 2018-05-04 – 2018-05-09 (×9): 2 via RESPIRATORY_TRACT
  Filled 2018-05-04: qty 8.8

## 2018-05-04 MED ORDER — ACETAMINOPHEN 325 MG PO TABS: 650.0000 mg | ORAL_TABLET | ORAL | Status: DC | PRN

## 2018-05-04 MED ORDER — ALBUTEROL SULFATE (2.5 MG/3ML) 0.083% IN NEBU: 3.0000 mL | INHALATION_SOLUTION | RESPIRATORY_TRACT | Status: DC | PRN

## 2018-05-04 MED ORDER — ACETAMINOPHEN 650 MG RE SUPP: 650.0000 mg | RECTAL | Status: DC | PRN

## 2018-05-04 MED ORDER — ATORVASTATIN CALCIUM 40 MG PO TABS
40.0000 mg | ORAL_TABLET | Freq: Every day | ORAL | Status: DC
  Administered 2018-05-05 – 2018-05-09 (×5): 40 mg via ORAL
  Filled 2018-05-04 (×5): qty 1

## 2018-05-04 MED ORDER — ESCITALOPRAM OXALATE 10 MG PO TABS
20.0000 mg | ORAL_TABLET | Freq: Every day | ORAL | Status: DC
  Administered 2018-05-05 – 2018-05-09 (×5): 20 mg via ORAL
  Filled 2018-05-04 (×5): qty 2

## 2018-05-04 MED ORDER — ASPIRIN 325 MG PO TABS
325.0000 mg | ORAL_TABLET | Freq: Every day | ORAL | Status: DC
  Administered 2018-05-05 (×2): 325 mg via ORAL
  Filled 2018-05-04 (×2): qty 1

## 2018-05-04 MED ORDER — POTASSIUM CHLORIDE IN NACL 20-0.9 MEQ/L-% IV SOLN
INTRAVENOUS | Status: AC
  Administered 2018-05-05: 01:00:00 via INTRAVENOUS
  Filled 2018-05-04: qty 1000

## 2018-05-04 MED ORDER — NICOTINE 21 MG/24HR TD PT24
21.0000 mg | MEDICATED_PATCH | Freq: Every day | TRANSDERMAL | Status: DC
  Administered 2018-05-05 – 2018-05-08 (×5): 21 mg via TRANSDERMAL
  Filled 2018-05-04 (×5): qty 1

## 2018-05-04 MED ORDER — ENOXAPARIN SODIUM 40 MG/0.4ML ~~LOC~~ SOLN
40.0000 mg | SUBCUTANEOUS | Status: DC
  Administered 2018-05-05 – 2018-05-09 (×5): 40 mg via SUBCUTANEOUS
  Filled 2018-05-04 (×5): qty 0.4

## 2018-05-04 MED ORDER — ACETAMINOPHEN 160 MG/5ML PO SOLN: 650.0000 mg | ORAL | Status: DC | PRN

## 2018-05-04 MED ORDER — CLOPIDOGREL BISULFATE 75 MG PO TABS
75.0000 mg | ORAL_TABLET | Freq: Every day | ORAL | Status: DC
  Administered 2018-05-05 – 2018-05-09 (×6): 75 mg via ORAL
  Filled 2018-05-04 (×6): qty 1

## 2018-05-04 MED ORDER — STROKE: EARLY STAGES OF RECOVERY BOOK: Freq: Once | Status: DC

## 2018-05-04 MED ORDER — HYDRALAZINE HCL 20 MG/ML IJ SOLN: 5.0000 mg | INTRAMUSCULAR | Status: DC | PRN

## 2018-05-04 MED ORDER — SODIUM CHLORIDE 0.9% FLUSH
3.0000 mL | Freq: Once | INTRAVENOUS | Status: AC
  Administered 2018-05-04: 19:00:00 3 mL via INTRAVENOUS

## 2018-05-04 MED ORDER — IOHEXOL 350 MG/ML SOLN
100.0000 mL | Freq: Once | INTRAVENOUS | Status: AC | PRN
  Administered 2018-05-04: 21:00:00 100 mL via INTRAVENOUS

## 2018-05-04 MED ORDER — SENNOSIDES-DOCUSATE SODIUM 8.6-50 MG PO TABS: 1.0000 | ORAL_TABLET | Freq: Every evening | ORAL | Status: DC | PRN

## 2018-05-04 NOTE — Consult Note (Signed)
Referring Physician: Dr. Sherry Ruffing    Chief Complaint: New symptoms of aphasia and confusion  HPI: [name] is an 73 y.o. female who presented to the [facility] ED via EMS with new onset aphasia. LKN was 4 PM yesterday when family had spoken with her. When family checked on her today, she was aphasic and confused. BP on EMS arrival was 208/114, with HR 98, O2 sat 98% and CBG 94. She was unable to ambulate on scene. After arrival to the ED, it was noted that she could follow commands and write her name with her right hand with some difficulty (large letters with decreased legibility) but had a dense expressive aphasia. Home meds include ASA and low-dose atorvastatin.   CMP was unremarkable. WBC 13.6. Coags normal.   EKG:  Sinus rhythm Probable left atrial enlargement Anterior infarct, old  CT Head: 1. 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 No acute intracranial abnormality. 2. Atrophy with chronic small vessel white matter ischemic disease.  LSN: 4 PM on Thursday tPA Given: No: Out of time window  Past Medical History:  Diagnosis Date  . Asthma   . Cancer ([place])   . Depression   . Hypertension   . Peripheral vascular disease ([place])   . Pneumonia   . Sleep apnea   . Stroke [facility])     Past Surgical History:  Procedure Laterality Date  . DIRECT LARYNGOSCOPY Left 06/29/2016   Procedure: WIDE EXCISION LEFT TONGUE CANCER DIRECT LARYNGOSCOPY;  Surgeon: [name], MD;  Location: [facility] OR;  Service: ENT;  Laterality: Left;  . ESOPHAGOSCOPY N/A 06/29/2016   Procedure: ESOPHAGOSCOPY/BRONCHOSCOPY;  Surgeon: [name], MD;  Location: [place];  Service: ENT;  Laterality: N/A;  . EYE SURGERY Bilateral 2016  . KNEE SURGERY Right 2014  . LOOP RECORDER INSERTION N/A 03/21/2016   Procedure: Loop Recorder Insertion;  Surgeon: Will Meredith Leeds, MD;  Location: [place] CV LAB;  Service: Cardiovascular;  Laterality: N/A;  . PARS PLANA VITRECTOMY Right 02/28/2014   Procedure: PARS PLANA VITRECTOMY  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 WITH TAP AND ANTIBIOTIC INJECTION;  Surgeon: Hurman Horn, MD;  Location: [place];  Service: Ophthalmology;  Laterality: Right;  . TEE WITHOUT CARDIOVERSION N/A 03/21/2016   Procedure: TRANSESOPHAGEAL ECHOCARDIOGRAM (TEE);  Surgeon: Skeet Latch, MD;  Location: [facility] ENDOSCOPY;  Service: Cardiovascular;  Laterality: N/A;    Family History  Problem Relation Age of Onset  . Cancer Mother        colon  . Stroke Father   . Heart disease Brother    Social History:  reports that she quit smoking about 23 months ago. She has never used smokeless tobacco. She reports that she does not drink alcohol or use drugs.  Allergies:  Allergies  Allergen Reactions  . Codeine Itching    Mouth broke out  . Neosporin [Neomycin-Bacitracin Zn-Polymyx] Itching and Swelling    swelling at application site    Home Medications:    ROS: Unable to obtain due to dense expressive aphasia.   Physical Examination: Blood pressure (!) 194/88, pulse 93, temperature 98.4 F (36.9 C), temperature source Oral, resp. rate (!) 21, SpO2 97 %.  HEENT: [place]/AT Lungs: Respirations unlabored Ext: No edema  Neurologic Examination: Mental Status: Awake and alert. Severe expressive aphasia with fragmentary utterances. The only discernible speech was her name, which was quite dysarthric, and at one instance a short phrase that began with normal grammar/syntax but then trailed off and became unintelligible. Not able to give indication of whether  she is oriented to time/place or not. Affect appears sad and somewhat distressed. Was able to follow all simple motor commands. Made a "pe" sound when asked to name a pen. Mild right sided neglect.  Cranial Nerves: II:  Right visual field intact. Left visual field with no blink to threat. PERRL.  III,IV, VI: EOMI without nystagmus. No ptosis.  V,VII: Right facial droop. Reacts less briskly to cool object on right side of face relative to left.  VIII: hearing  intact to voice IX,X: Unable to assess.  XI: Lag on right when asked to shrug shoulders XII: Tongue deviates slightly to the right Motor: RUE 4/5 proximally and distally, including grip LUE 5/5 RLE 4+/5 proximally and distally LLE 5/5 Sensory: Moves upper and lower limbs that are touched when asked, with no gross asymmetry Deep Tendon Reflexes:  2+ bilateral brachioradialis and biceps 2+ patellae bilaterally Plantars: Right: Upgoing   Left: Equivocal Cerebellar: No ataxia with FNF bilaterally. Slow movements on the right.  Gait: Deferred   No results found for this or any previous visit (from the past 48 hour(s)). No results found.  Assessment: 73 y.o. female presenting with acute onset of expressive aphasia and right hemiparesis, arm worse than leg 1. Exam findings best localize to the left frontal operculum with some prefrontal motor involvement. No corresponding hemorrhage or hypodensity on CT head.  2. Also with a left visual field cut, which correlates with the chronic right occipital lobe ischemic infarction seen on CT.  concerning for a right occipital lobe stroke. 3. Stroke Risk Factors - Cancer, HTN, PVD, prior stroke 4. History of loop recorder placement March 2018. 5. Out of time windows for both tPA and endovascular treatment  Plan: 1. CTA head and neck with perfusion is being obtained 2. If her loop recorder is compatible, obtain MRI of the brain without contrast 3. PT consult, OT consult, Speech consult 4. TTE 5. Cardiac telemetry 6. Prophylactic therapy- Add Plavix to ASA 7. Increase atorvastatin to 40 mg po qd. Obtain baseline CK level 8. Frequent neuro checks 9. HgbA1c, fasting lipid panel 10. Permissive HTN   Addendum: CTA shows left M2 superior division occlusion and perfusion deficit in left frontal operculum of 20 cc. Discussed with Dr. Estanislado Pandy. Given that she is > 24 hours out since [facility], an endovascular procedure as an exception to the guidelines is  felt to carry a greater risk than benefit in terms of overall morbidity and mortality.   @Electronically  signed: Dr. Kerney Elbe 05/04/2018, 7:13 PM

## 2018-05-04 NOTE — ED Notes (Signed)
Pt able to write name and numbers without difficulty, though speech is difficult.

## 2018-05-04 NOTE — ED Notes (Signed)
Called pt family and updated on current situation and plan for admission.

## 2018-05-04 NOTE — ED Notes (Addendum)
ED TO INPATIENT HANDOFF REPORT  ED Nurse Name and Phone #:   Freida Busman  [phone]  S Name/Age/Gender [name] 73 y.o. female Room/Bed: 033C/033C  Code Status   Code Status: Prior  Home/SNF/Other Home Patient oriented to: self, place, time and situation Is this baseline? Yes   Triage Complete: Triage complete  Chief Complaint confusion  Triage Note LSW 4pm yesterday. Pt arrived EMS from home where she lives alone. Family spoke with her yesterday at 4pm and states that she was baseline at this time. When the family went to check on her today they found her aphasic and confused.  (oriented to name and place) 18RAC BP 208/114 P 98 O2 98 CBG94 Able to ambulate on scene.    Allergies Allergies  Allergen Reactions  . Codeine Itching    Mouth broke out  . Neosporin [Neomycin-Bacitracin Zn-Polymyx] Itching and Swelling    swelling at application site    Level of Care/Admitting Diagnosis ED Disposition    ED Disposition Condition [facility] Area: [place] [[id number]]  Level of Care: Telemetry Medical [104]  I expect the patient will be discharged within 24 hours: No (not a candidate for 5C-Observation unit)  Covid Evaluation: N/A  Diagnosis: Expressive aphasia [673419]  Admitting Physician: Lenore Cordia [[id number]]  Attending Physician: Lenore Cordia [[id number]]  PT Class (Do Not Modify): Observation [104]  PT Acc Code (Do Not Modify): Observation [10022]       B Medical/Surgery History Past Medical History:  Diagnosis Date  . Asthma   . Cancer ([place])   . Depression   . Hypertension   . Peripheral vascular disease ([place])   . Pneumonia   . Sleep apnea   . Stroke [facility])    Past Surgical History:  Procedure Laterality Date  . DIRECT LARYNGOSCOPY Left 06/29/2016   Procedure: WIDE EXCISION LEFT TONGUE CANCER DIRECT LARYNGOSCOPY;  Surgeon: [name], MD;  Location: [facility] OR;  Service: ENT;  Laterality: Left;  .  ESOPHAGOSCOPY N/A 06/29/2016   Procedure: ESOPHAGOSCOPY/BRONCHOSCOPY;  Surgeon: [name], MD;  Location: [place];  Service: ENT;  Laterality: N/A;  . EYE SURGERY Bilateral 2016  . KNEE SURGERY Right 2014  . LOOP RECORDER INSERTION N/A 03/21/2016   Procedure: Loop Recorder Insertion;  Surgeon: Will Meredith Leeds, MD;  Location: [place] CV LAB;  Service: Cardiovascular;  Laterality: N/A;  . PARS PLANA VITRECTOMY Right 02/28/2014   Procedure: PARS PLANA VITRECTOMY 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 WITH TAP AND ANTIBIOTIC INJECTION;  Surgeon: Hurman Horn, MD;  Location: [place];  Service: Ophthalmology;  Laterality: Right;  . TEE WITHOUT CARDIOVERSION N/A 03/21/2016   Procedure: TRANSESOPHAGEAL ECHOCARDIOGRAM (TEE);  Surgeon: Skeet Latch, MD;  Location: [place];  Service: Cardiovascular;  Laterality: N/A;     A IV Location/Drains/Wounds Patient Lines/Drains/Airways Status   Active Line/Drains/Airways    Name:   Placement date:   Placement time:   Site:   Days:   Peripheral IV 05/04/18 Right Antecubital   05/04/18    1900    Antecubital   less than 1   Incision (Closed) 02/28/14 Eye Right   02/28/14    1558     1526   Incision (Closed) 06/29/16 Lip   06/29/16    1044     674   Wound / Incision (Open or Dehisced) 03/18/16 Laceration Leg Right;Left   03/18/16    2123    Leg   777  Intake/Output Last 24 hours  Intake/Output Summary (Last 24 hours) at 05/04/2018 2109 Last data filed at 05/04/2018 1928 Gross per 24 hour  Intake 3 ml  Output -  Net 3 ml    Labs/Imaging Results for orders placed or performed during the hospital encounter of 05/04/18 (from the past 48 hour(s))  Protime-INR     Status: None   Collection Time: 05/04/18  7:19 PM  Result Value Ref Range   Prothrombin Time 12.8 11.4 - 15.2 seconds   INR 1.0 0.8 - 1.2    Comment: (NOTE) INR goal varies based on device and disease states. Performed at [facility], [place] [address] [place],  [place] [address]   APTT     Status: None   Collection Time: 05/04/18  7:19 PM  Result Value Ref Range   aPTT 32 24 - 36 seconds    Comment: Performed at [facility] [address], [place], [place] [address]  CBC     Status: Abnormal   Collection Time: 05/04/18  7:19 PM  Result Value Ref Range   WBC 13.6 (H) 4.0 - 10.5 K/uL   RBC 4.61 3.87 - 5.11 MIL/uL   Hemoglobin 14.1 12.0 - 15.0 g/dL   HCT 43.7 36.0 - 46.0 %   MCV 94.8 80.0 - 100.0 fL   MCH 30.6 26.0 - 34.0 pg   MCHC 32.3 30.0 - 36.0 g/dL   RDW 13.4 11.5 - 15.5 %   Platelets 214 150 - 400 K/uL   nRBC 0.0 0.0 - 0.2 %    Comment: Performed at [facility], [place] [address], [place], [place] [address]  Differential     Status: Abnormal   Collection Time: 05/04/18  7:19 PM  Result Value Ref Range   Neutrophils Relative % 79 %   Neutro Abs 10.8 (H) 1.7 - 7.7 K/uL   Lymphocytes Relative 13 %   Lymphs Abs 1.8 0.7 - 4.0 K/uL   Monocytes Relative 5 %   Monocytes Absolute 0.7 0.1 - 1.0 K/uL   Eosinophils Relative 2 %   Eosinophils Absolute 0.2 0.0 - 0.5 K/uL   Basophils Relative 1 %   Basophils Absolute 0.1 0.0 - 0.1 K/uL   Immature Granulocytes 0 %   Abs Immature Granulocytes 0.04 0.00 - 0.07 K/uL    Comment: Performed at [facility] [address], [place], [place] [address]  Comprehensive metabolic panel     Status: Abnormal   Collection Time: 05/04/18  7:19 PM  Result Value Ref Range   Sodium 138 135 - 145 mmol/L   Potassium 3.9 3.5 - 5.1 mmol/L   Chloride 104 98 - 111 mmol/L   CO2 22 22 - 32 mmol/L   Glucose, Bld 108 (H) 70 - 99 mg/dL   BUN 17 8 - 23 mg/dL   Creatinine, Ser 0.99 0.44 - 1.00 mg/dL   Calcium 9.2 8.9 - 10.3 mg/dL   Total Protein 6.9 6.5 - 8.1 g/dL   Albumin 4.2 3.5 - 5.0 g/dL   AST 23 15 - 41 U/L   ALT 14 0 - 44 U/L   Alkaline Phosphatase 72 38 - 126 U/L   Total Bilirubin 0.6 0.3 - 1.2 mg/dL   GFR calc non Af Amer 57 (L) >60 mL/min   GFR calc Af Amer >60 >60 mL/min   Anion gap 12 5 -  15    Comment: Performed at [facility]  [address] [place], [place] [address]  CBG monitoring, ED     Status: Abnormal   Collection Time: 05/04/18  7:26 PM  Result Value Ref Range   Glucose-Capillary 122 (H) 70 - 99 mg/dL   Comment 1 Document in Chart   I-stat Creatinine, ED     Status: None   Collection Time: 05/04/18  7:28 PM  Result Value Ref Range   Creatinine, Ser 0.80 0.44 - 1.00 mg/dL   Ct Head Wo Contrast  Result Date: 05/04/2018 CLINICAL DATA:  Altered level of consciousness. EXAM: CT HEAD WITHOUT CONTRAST TECHNIQUE: Contiguous axial images were obtained from the base of the skull through the vertex without intravenous contrast. COMPARISON:  03/18/2016 FINDINGS: Brain: Old right PCA territory infarct noted. There is no evidence for acute hemorrhage, hydrocephalus, mass lesion, or abnormal extra-axial fluid collection. No definite CT evidence for acute infarction. Diffuse loss of parenchymal volume is consistent with atrophy. Patchy low attenuation in the deep hemispheric and periventricular white matter is nonspecific, but likely reflects chronic microvascular ischemic demyelination. Vascular: No hyperdense vessel or unexpected calcification. Skull: No evidence for fracture. No worrisome lytic or sclerotic lesion. Sinuses/Orbits: The visualized paranasal sinuses and mastoid air cells are clear. Visualized portions of the globes and intraorbital fat are unremarkable. Other: None. IMPRESSION: 1. 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 No acute intracranial abnormality. 2. Atrophy with chronic small vessel white matter ischemic disease. Electronically Signed   By: Misty Stanley M.D.   On: 05/04/2018 19:41    Pending Labs Unresulted Labs (From admission, onward)   None      Vitals/Pain Today's Vitals   05/04/18 1945 05/04/18 2000 05/04/18 2017 05/04/18 2030  BP:   (!) 162/75 (!) 173/78  Pulse: 94 97 88 84  Resp: 20 (!) 25 (!) 21 (!) 21  Temp:      TempSrc:      SpO2: 96% 98%  95% 95%  Weight:      Height:      PainSc:        Isolation Precautions No active isolations  Medications Medications  sodium chloride flush (NS) 0.9 % injection 3 mL (3 mLs Intravenous Given 05/04/18 1929)    Mobility walks High fall risk   Focused Assessments Neuro Assessment Handoff:  Swallow screen pass? No    NIH Stroke Scale ( + Modified Stroke Scale Criteria)  Interval: Initial Level of Consciousness (1a.)   : Alert, keenly responsive LOC Questions (1b. )   +: Answers both questions correctly LOC Commands (1c. )   + : Performs both tasks correctly Best Gaze (2. )  +: Normal Visual (3. )  +: Partial hemianopia Facial Palsy (4. )    : Normal symmetrical movements Motor Arm, Left (5a. )   +: No drift Motor Arm, Right (5b. )   +: No drift Motor Leg, Left (6a. )   +: No drift Motor Leg, Right (6b. )   +: No drift Limb Ataxia (7. ): Absent Sensory (8. )   +: Normal, no sensory loss Best Language (9. )   +: Severe aphasia Dysarthria (10. ): Normal Extinction/Inattention (11.)   +: No Abnormality Modified SS Total  +: 3 Complete NIHSS TOTAL: 4 Last date known well: 05/03/18 Last time known well: 1600 Neuro Assessment: Exceptions to WDL Neuro Checks:   Initial (05/04/18 1916)  Last Documented NIHSS Modified Score: 3 (05/04/18 2038) Has TPA been given? No If patient is a Neuro Trauma and patient is going to OR before floor  call report to [place] nurse: [phone] or [phone]     R Recommendations: See Admitting Provider Note  Report given to:   Additional Notes:   LKW 4/16 @ 1600. Can give Yes/No or other very short answers to questions. Hx of previous CVA, but unclear what may be residual deficits. Updated pt's brother in [place] regarding basic status and plan for admission. Pt ambulatory to and from hallway bathroom with only standby assistance.

## 2018-05-04 NOTE — ED Notes (Signed)
[place] [name]: pts brother) live in [place] worried about pt and want an update if possible

## 2018-05-04 NOTE — ED Provider Notes (Signed)
[facility] EMERGENCY DEPARTMENT Provider Note   CSN: [id number] Arrival date & time: 05/04/18  [place]    History   Chief Complaint Chief Complaint  Patient presents with   Altered Mental Status    HPI [name] is a 73 y.o. female.     The history is provided by the patient and the EMS personnel. The history is limited by the condition of the patient (pt aphasic). No language interpreter was used.  Neurologic Problem  This is a new problem. The current episode started yesterday. The problem occurs constantly. The problem has not changed since onset.Pertinent negatives include no chest pain, no abdominal pain, no headaches and no shortness of breath. Nothing aggravates the symptoms. Nothing relieves the symptoms. She has tried nothing for the symptoms. The treatment provided no relief.   LVL 5 caveat for aphasia   Past Medical History:  Diagnosis Date   Asthma    Cancer ([place])    Depression    Hypertension    Peripheral vascular disease ([place])    Pneumonia    Sleep apnea    Stroke [facility])     Patient Active Problem List   Diagnosis Date Noted   Cerebrovascular accident (CVA) ([place]) 03/18/2016   Peripheral vascular disease, unspecified ([place]) 05/23/2012    Past Surgical History:  Procedure Laterality Date   DIRECT LARYNGOSCOPY Left 06/29/2016   Procedure: WIDE EXCISION LEFT TONGUE CANCER DIRECT LARYNGOSCOPY;  Surgeon: [name], MD;  Location: [place];  Service: ENT;  Laterality: Left;   ESOPHAGOSCOPY N/A 06/29/2016   Procedure: ESOPHAGOSCOPY/BRONCHOSCOPY;  Surgeon: [name], MD;  Location: [place];  Service: ENT;  Laterality: N/A;   EYE SURGERY Bilateral 2016   KNEE SURGERY Right 2014   LOOP RECORDER INSERTION N/A 03/21/2016   Procedure: Loop Recorder Insertion;  Surgeon: Will Meredith Leeds, MD;  Location: [place] [facility];  Service: Cardiovascular;  Laterality: N/A;   PARS PLANA VITRECTOMY Right 02/28/2014   Procedure:  PARS PLANA VITRECTOMY 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 WITH TAP AND ANTIBIOTIC INJECTION;  Surgeon: Hurman Horn, MD;  Location: [place];  Service: Ophthalmology;  Laterality: Right;   TEE WITHOUT CARDIOVERSION N/A 03/21/2016   Procedure: TRANSESOPHAGEAL ECHOCARDIOGRAM (TEE);  Surgeon: Skeet Latch, MD;  Location[facility];  Service: Cardiovascular;  Laterality: N/A;     OB History   No obstetric history on file.      Home Medications    Prior to Admission medications   Medication Sig Start Date End Date Taking? Authorizing Provider  albuterol (PROVENTIL) (2.5 MG/3ML) 0.083% nebulizer solution Take 2.5 mg by nebulization every 6 (six) hours as needed for wheezing or shortness of breath.    [provider]  amLODipine (NORVASC) 10 MG tablet Take 1 tablet (10 mg total) by mouth daily. Patient not taking: Reported on 03/26/2018 03/21/16   Hosie Poisson, MD  aspirin 81 MG tablet Take 1 tablet (81 mg total) by mouth daily. 11/08/16   Ward Givens, NP  atorvastatin (LIPITOR) 10 MG tablet TAKE 1 TABLET (10 MG) DAILY IN THE MORNING. 03/16/16   [provider]  azelastine (ASTELIN) 0.1 % nasal spray Place 1 spray into both nostrils 2 (two) times daily as needed for allergies. Use in each nostril as directed    [provider]  budesonide-formoterol (SYMBICORT) 80-4.5 MCG/ACT inhaler Inhale 2 puffs into the lungs 2 (two) times daily as needed (FOR RESPIRATORY ISSUES.).    [provider]  Cholecalciferol (VITAMIN D) 2000 units  CAPS Take 2,000 Units by mouth daily.    [provider]  escitalopram (LEXAPRO) 20 MG tablet Take 20 mg by mouth daily.  07/10/14   [provider]  Melatonin 3 MG CAPS Take 3 mg by mouth at bedtime as needed (for sleep.).     [provider]  Omega-3 Fatty Acids (FISH OIL) 1200 MG CAPS Take 1,200 mg by mouth daily.    [provider]    Family History Family History  Problem Relation Age of Onset    Cancer Mother        colon   Stroke Father    Heart disease Brother     Social History Social History   Tobacco Use   Smoking status: Former Smoker    Last attempt to quit: 05/29/2016    Years since quitting: 1.9   Smokeless tobacco: Never Used  Substance Use Topics   Alcohol use: No   Drug use: No     Allergies   Codeine and Neosporin [neomycin-bacitracin zn-polymyx]   Review of Systems Review of Systems  Constitutional: Negative for chills, diaphoresis, fatigue and fever.  HENT: Negative for congestion and rhinorrhea.   Eyes: Negative for visual disturbance.  Respiratory: Negative for cough, chest tightness, shortness of breath and wheezing.   Cardiovascular: Negative for chest pain.  Gastrointestinal: Negative for abdominal pain, constipation, diarrhea, nausea and vomiting.  Genitourinary: Negative for dysuria.  Musculoskeletal: Negative for back pain, neck pain and neck stiffness.  Skin: Negative for rash and wound.  Neurological: Positive for speech difficulty. Negative for dizziness, facial asymmetry, weakness, light-headedness, numbness and headaches.  Psychiatric/Behavioral: Negative for agitation and confusion.  All other systems reviewed and are negative.    Physical Exam Updated Vital Signs BP (!) 202/89    Pulse 92    Temp 98.4 F (36.9 C) (Oral)    Resp 18    SpO2 97%   Physical Exam Vitals signs and nursing note reviewed.  Constitutional:      General: She is not in acute distress.    Appearance: She is well-developed. She is not ill-appearing, toxic-appearing or diaphoretic.  HENT:     Head: Normocephalic and atraumatic.     Right Ear: External ear normal.     Left Ear: External ear normal.     Nose: Nose normal. No congestion or rhinorrhea.     Mouth/Throat:     Mouth: Mucous membranes are moist.     Pharynx: No oropharyngeal exudate.  Eyes:     Extraocular Movements: Extraocular movements intact.     Conjunctiva/sclera: Conjunctivae  normal.     Pupils: Pupils are equal, round, and reactive to light.  Neck:     Musculoskeletal: Normal range of motion and neck supple. No muscular tenderness.  Cardiovascular:     Rate and Rhythm: Normal rate.     Pulses: Normal pulses.     Heart sounds: No murmur.  Pulmonary:     Effort: No respiratory distress.     Breath sounds: No stridor. No wheezing or rales.  Chest:     Chest wall: No tenderness.  Abdominal:     General: Abdomen is flat. There is no distension.     Tenderness: There is no abdominal tenderness. There is no rebound.  Skin:    General: Skin is warm.     Findings: No erythema or rash.  Neurological:     General: No focal deficit present.     Mental Status: She is alert and  oriented to person, place, and time.     GCS: GCS eye subscore is 4. GCS verbal subscore is 5. GCS motor subscore is 6.     Cranial Nerves: No dysarthria.     Sensory: Sensation is intact. No sensory deficit.     Motor: No weakness or abnormal muscle tone.     Coordination: Finger-Nose-Finger Test normal.     Deep Tendon Reflexes: Reflexes are normal and symmetric.     Comments: Patient has expressive aphasia.  Psychiatric:        Mood and Affect: Mood normal.      ED Treatments / Results  Labs (all labs ordered are listed, but only abnormal results are displayed) Labs Reviewed  CBC - Abnormal; Notable for the following components:      Result Value   WBC 13.6 (*)    All other components within normal limits  DIFFERENTIAL - Abnormal; Notable for the following components:   Neutro Abs 10.8 (*)    All other components within normal limits  COMPREHENSIVE METABOLIC PANEL - Abnormal; Notable for the following components:   Glucose, Bld 108 (*)    GFR calc non Af Amer 57 (*)    All other components within normal limits  HEMOGLOBIN A1C - Abnormal; Notable for the following components:   Hgb A1c MFr Bld 6.0 (*)    All other components within normal limits  CBG MONITORING, ED -  Abnormal; Notable for the following components:   Glucose-Capillary 122 (*)    All other components within normal limits  PROTIME-INR  APTT  LIPID PANEL  CBC  BASIC METABOLIC PANEL  I-STAT CREATININE, ED    EKG EKG Interpretation  Date/Time:  Friday May 04 2018 18:42:26 EDT Ventricular Rate:  95 PR Interval:    QRS Duration: 90 QT Interval:  371 QTC Calculation: 467 R Axis:   6 Text Interpretation:  Sinus rhythm Probable left atrial enlargement Anterior infarct, old When compared to prior, no significant cahnges seen.  No STEMI Confirmed by Antony Blackbird [phone]) on 05/04/2018 6:45:28 PM   Radiology Ct Angio Head W Or Wo Contrast  Result Date: 05/04/2018 CLINICAL DATA:  Aphasia.  Right arm weakness. EXAM: CT ANGIOGRAPHY HEAD AND NECK CT PERFUSION BRAIN TECHNIQUE: Multidetector CT imaging of the head and neck was performed using the standard protocol during bolus administration of intravenous contrast. Multiplanar CT image reconstructions and MIPs were obtained to evaluate the vascular anatomy. Carotid stenosis measurements (when applicable) are obtained utilizing NASCET criteria, using the distal internal carotid diameter as the denominator. Multiphase CT imaging of the brain was performed following IV bolus contrast injection. Subsequent parametric perfusion maps were calculated using RAPID software. CONTRAST:  162mL OMNIPAQUE IOHEXOL 350 MG/ML SOLN COMPARISON:  Head and neck MRA 03/18/2016 FINDINGS: CTA NECK FINDINGS Aortic arch: Standard 3 vessel aortic arch with moderate atherosclerotic plaque. Prominent soft plaque in the proximal left subclavian artery results in just under 50% stenosis. Right carotid system: Patent with calcified plaque at the carotid bifurcation and in the distal cervical ICA without evidence of significant stenosis or dissection. Left carotid system: Patent with moderate volume calcified plaque at the carotid bifurcation without evidence of significant stenosis  or dissection. Vertebral arteries: Chronic right vertebral artery occlusion near its origin with only minimal intermittent reconstitution in the neck. Patent left vertebral artery with chronic mild-to-moderate V1 segment stenosis. Skeleton: Mild cervical spondylosis. Moderate cervical facet arthrosis. Other neck: No mass or enlarged lymph nodes. Upper chest: Biapical pleuroparenchymal lung scarring.  Mild centrilobular emphysema. Review of the MIP images confirms the above findings CTA HEAD FINDINGS Anterior circulation: The internal carotid arteries are patent from skull base to carotid termini with mild-to-moderate plaque bilaterally not resulting in significant stenosis. ACAs and MCAs are patent without significant A1 or M1 stenosis. There is new occlusion of a proximal left M2 branch vessel with distal reconstitution. No aneurysm is identified. Posterior circulation: The intracranial left vertebral artery is widely patent and supplies the basilar. There is retrograde opacification of the right vertebral artery with patent PICAs bilaterally. The basilar artery is widely patent. There is a robust left posterior communicating artery. The left PCA is patent with mild branch vessel irregularity. The right PCA is occluded at the distal P1 level, more proximally than on the 2018 MRA where there was occlusion beginning at the mid P2 level. No aneurysm is identified. Venous sinuses: Patent. Anatomic variants: None. Review of the MIP images confirms the above findings CT Brain Perfusion Findings: ASPECTS: 9 upon reassessment of earlier noncontrast CT with small region of acute infarct in the left insula CBF (<30%) Volume: 83mL Perfusion (Tmax>6.0s) volume: 57mL Mismatch Volume: 26mL, though with caveat that automated analysis did not detect the early small infarct apparent in the left insula on noncontrast CT Infarction Location: No core infarct. Delayed perfusion/penumbra in the left MCA territory involving the posterior  frontal and anterior parietal lobes including in the operculum. IMPRESSION: 1. Proximal left M2 occlusion with distal reconstitution. 2. Left MCA ischemia with penumbra as detailed above. ASPECTS of 9 upon reassessment of earlier noncontrast CT. 3. 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 4. Chronic 50% proximal left subclavian and mild-to-moderate proximal left vertebral artery stenoses. 5. No significant carotid artery stenosis. 6. Aortic Atherosclerosis (ICD10-I70.0) and Emphysema (ICD10-J43.9). These results were called by telephone at the time of interpretation on 05/04/2018 at 9:50 pm to Dr. Kerney Elbe , who verbally acknowledged these results. Electronically Signed   By: Logan Bores M.D.   On: 05/04/2018 22:20   Ct Head Wo Contrast  Result Date: 05/04/2018 CLINICAL DATA:  Altered level of consciousness. EXAM: CT HEAD WITHOUT CONTRAST TECHNIQUE: Contiguous axial images were obtained from the base of the skull through the vertex without intravenous contrast. COMPARISON:  03/18/2016 FINDINGS: Brain: Old right PCA territory infarct noted. There is no evidence for acute hemorrhage, hydrocephalus, mass lesion, or abnormal extra-axial fluid collection. No definite CT evidence for acute infarction. Diffuse loss of parenchymal volume is consistent with atrophy. Patchy low attenuation in the deep hemispheric and periventricular white matter is nonspecific, but likely reflects chronic microvascular ischemic demyelination. Vascular: No hyperdense vessel or unexpected calcification. Skull: No evidence for fracture. No worrisome lytic or sclerotic lesion. Sinuses/Orbits: The visualized paranasal sinuses and mastoid air cells are clear. Visualized portions of the globes and intraorbital fat are unremarkable. Other: None. IMPRESSION: 1. 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 No acute intracranial abnormality. 2. Atrophy with chronic small vessel white matter ischemic disease. Electronically Signed   By:  Misty Stanley M.D.   On: 05/04/2018 19:41   Ct Angio Neck W Or Wo Contrast  Result Date: 05/04/2018 CLINICAL DATA:  Aphasia.  Right arm weakness. EXAM: CT ANGIOGRAPHY HEAD AND NECK CT PERFUSION BRAIN TECHNIQUE: Multidetector CT imaging of the head and neck was performed using the standard protocol during bolus administration of intravenous contrast. Multiplanar CT image reconstructions and MIPs were obtained to evaluate the vascular anatomy. Carotid stenosis measurements (when applicable) are obtained utilizing NASCET criteria,  using the distal internal carotid diameter as the denominator. Multiphase CT imaging of the brain was performed following IV bolus contrast injection. Subsequent parametric perfusion maps were calculated using RAPID software. CONTRAST:  132mL OMNIPAQUE IOHEXOL 350 MG/ML SOLN COMPARISON:  Head and neck MRA 03/18/2016 FINDINGS: CTA NECK FINDINGS Aortic arch: Standard 3 vessel aortic arch with moderate atherosclerotic plaque. Prominent soft plaque in the proximal left subclavian artery results in just under 50% stenosis. Right carotid system: Patent with calcified plaque at the carotid bifurcation and in the distal cervical ICA without evidence of significant stenosis or dissection. Left carotid system: Patent with moderate volume calcified plaque at the carotid bifurcation without evidence of significant stenosis or dissection. Vertebral arteries: Chronic right vertebral artery occlusion near its origin with only minimal intermittent reconstitution in the neck. Patent left vertebral artery with chronic mild-to-moderate V1 segment stenosis. Skeleton: Mild cervical spondylosis. Moderate cervical facet arthrosis. Other neck: No mass or enlarged lymph nodes. Upper chest: Biapical pleuroparenchymal lung scarring. Mild centrilobular emphysema. Review of the MIP images confirms the above findings CTA HEAD FINDINGS Anterior circulation: The internal carotid arteries are patent from skull base to  carotid termini with mild-to-moderate plaque bilaterally not resulting in significant stenosis. ACAs and MCAs are patent without significant A1 or M1 stenosis. There is new occlusion of a proximal left M2 branch vessel with distal reconstitution. No aneurysm is identified. Posterior circulation: The intracranial left vertebral artery is widely patent and supplies the basilar. There is retrograde opacification of the right vertebral artery with patent PICAs bilaterally. The basilar artery is widely patent. There is a robust left posterior communicating artery. The left PCA is patent with mild branch vessel irregularity. The right PCA is occluded at the distal P1 level, more proximally than on the 2018 MRA where there was occlusion beginning at the mid P2 level. No aneurysm is identified. Venous sinuses: Patent. Anatomic variants: None. Review of the MIP images confirms the above findings CT Brain Perfusion Findings: ASPECTS: 9 upon reassessment of earlier noncontrast CT with small region of acute infarct in the left insula CBF (<30%) Volume: 19mL Perfusion (Tmax>6.0s) volume: 53mL Mismatch Volume: 61mL, though with caveat that automated analysis did not detect the early small infarct apparent in the left insula on noncontrast CT Infarction Location: No core infarct. Delayed perfusion/penumbra in the left MCA territory involving the posterior frontal and anterior parietal lobes including in the operculum. IMPRESSION: 1. Proximal left M2 occlusion with distal reconstitution. 2. Left MCA ischemia with penumbra as detailed above. ASPECTS of 9 upon reassessment of earlier noncontrast CT. 3. 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 4. Chronic 50% proximal left subclavian and mild-to-moderate proximal left vertebral artery stenoses. 5. No significant carotid artery stenosis. 6. Aortic Atherosclerosis (ICD10-I70.0) and Emphysema (ICD10-J43.9). These results were called by telephone at the time of  interpretation on 05/04/2018 at 9:50 pm to Dr. Kerney Elbe , who verbally acknowledged these results. Electronically Signed   By: Logan Bores M.D.   On: 05/04/2018 22:20   Ct Cerebral Perfusion W Contrast  Result Date: 05/04/2018 CLINICAL DATA:  Aphasia.  Right arm weakness. EXAM: CT ANGIOGRAPHY HEAD AND NECK CT PERFUSION BRAIN TECHNIQUE: Multidetector CT imaging of the head and neck was performed using the standard protocol during bolus administration of intravenous contrast. Multiplanar CT image reconstructions and MIPs were obtained to evaluate the vascular anatomy. Carotid stenosis measurements (when applicable) are obtained utilizing NASCET criteria, using the distal internal carotid diameter as the denominator. Multiphase  CT imaging of the brain was performed following IV bolus contrast injection. Subsequent parametric perfusion maps were calculated using RAPID software. CONTRAST:  128mL OMNIPAQUE IOHEXOL 350 MG/ML SOLN COMPARISON:  Head and neck MRA 03/18/2016 FINDINGS: CTA NECK FINDINGS Aortic arch: Standard 3 vessel aortic arch with moderate atherosclerotic plaque. Prominent soft plaque in the proximal left subclavian artery results in just under 50% stenosis. Right carotid system: Patent with calcified plaque at the carotid bifurcation and in the distal cervical ICA without evidence of significant stenosis or dissection. Left carotid system: Patent with moderate volume calcified plaque at the carotid bifurcation without evidence of significant stenosis or dissection. Vertebral arteries: Chronic right vertebral artery occlusion near its origin with only minimal intermittent reconstitution in the neck. Patent left vertebral artery with chronic mild-to-moderate V1 segment stenosis. Skeleton: Mild cervical spondylosis. Moderate cervical facet arthrosis. Other neck: No mass or enlarged lymph nodes. Upper chest: Biapical pleuroparenchymal lung scarring. Mild centrilobular emphysema. Review of the MIP images  confirms the above findings CTA HEAD FINDINGS Anterior circulation: The internal carotid arteries are patent from skull base to carotid termini with mild-to-moderate plaque bilaterally not resulting in significant stenosis. ACAs and MCAs are patent without significant A1 or M1 stenosis. There is new occlusion of a proximal left M2 branch vessel with distal reconstitution. No aneurysm is identified. Posterior circulation: The intracranial left vertebral artery is widely patent and supplies the basilar. There is retrograde opacification of the right vertebral artery with patent PICAs bilaterally. The basilar artery is widely patent. There is a robust left posterior communicating artery. The left PCA is patent with mild branch vessel irregularity. The right PCA is occluded at the distal P1 level, more proximally than on the 2018 MRA where there was occlusion beginning at the mid P2 level. No aneurysm is identified. Venous sinuses: Patent. Anatomic variants: None. Review of the MIP images confirms the above findings CT Brain Perfusion Findings: ASPECTS: 9 upon reassessment of earlier noncontrast CT with small region of acute infarct in the left insula CBF (<30%) Volume: 65mL Perfusion (Tmax>6.0s) volume: 21mL Mismatch Volume: 49mL, though with caveat that automated analysis did not detect the early small infarct apparent in the left insula on noncontrast CT Infarction Location: No core infarct. Delayed perfusion/penumbra in the left MCA territory involving the posterior frontal and anterior parietal lobes including in the operculum. IMPRESSION: 1. Proximal left M2 occlusion with distal reconstitution. 2. Left MCA ischemia with penumbra as detailed above. ASPECTS of 9 upon reassessment of earlier noncontrast CT. 3. 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 4. Chronic 50% proximal left subclavian and mild-to-moderate proximal left vertebral artery stenoses. 5. No significant carotid artery stenosis. 6.  Aortic Atherosclerosis (ICD10-I70.0) and Emphysema (ICD10-J43.9). These results were called by telephone at the time of interpretation on 05/04/2018 at 9:50 pm to Dr. Kerney Elbe , who verbally acknowledged these results. Electronically Signed   By: Logan Bores M.D.   On: 05/04/2018 22:20    Procedures Procedures (including critical care time)  Medications Ordered in ED Medications   stroke: mapping our early stages of recovery book (0 each Does not apply Hold 05/05/18 0200)  acetaminophen (TYLENOL) tablet 650 mg (has no administration in time range)    Or  acetaminophen (TYLENOL) solution 650 mg (has no administration in time range)    Or  acetaminophen (TYLENOL) suppository 650 mg (has no administration in time range)  senna-docusate (Senokot-S) tablet 1 tablet (has no administration in time range)  enoxaparin (LOVENOX)  injection 40 mg (has no administration in time range)  aspirin suppository 300 mg ( Rectal See Alternative 05/05/18 0046)    Or  aspirin tablet 325 mg (325 mg Oral Given 05/05/18 0046)  nicotine (NICODERM CQ - dosed in mg/24 hours) patch 21 mg (21 mg Transdermal Patch Applied 05/05/18 0046)  hydrALAZINE (APRESOLINE) injection 5 mg (has no administration in time range)  mometasone-formoterol (DULERA) 100-5 MCG/ACT inhaler 2 puff (2 puffs Inhalation Given 05/04/18 2315)  escitalopram (LEXAPRO) tablet 20 mg (has no administration in time range)  atorvastatin (LIPITOR) tablet 40 mg (has no administration in time range)  albuterol (PROVENTIL) (2.5 MG/3ML) 0.083% nebulizer solution 3 mL (has no administration in time range)  0.9 % NaCl with KCl 20 mEq/ L  infusion (has no administration in time range)  clopidogrel (PLAVIX) tablet 75 mg (75 mg Oral Given 05/05/18 0046)  sodium chloride flush (NS) 0.9 % injection 3 mL (3 mLs Intravenous Given 05/04/18 1929)  iohexol (OMNIPAQUE) 350 MG/ML injection 100 mL (100 mLs Intravenous Contrast Given 05/04/18 2129)     Initial Impression /  Assessment and Plan / ED Course  I have reviewed the triage vital signs and the nursing notes.  Pertinent labs & imaging results that were available during my care of the patient were reviewed by me and considered in my medical decision making (see chart for details).        [name] is a 74 y.o. female with a past medical history sniffing for asthma, hypertension, and prior stroke who presents with expressive aphasia.  Patient's last normal was at 4 PM yesterday per EMS who spoke with family.  Patient is unable to speak aside from answering yes/no questions.  Patient says she has had no neurologic symptoms preceding this and denies any fevers, chills, chest pain, shortness of breath, nausea vomiting, vision changes, numbness, tingling, weakness, headache, neck pain, neck stiffness.  She has no recent trauma.  She reports no other complaints.  On exam, patient is aphasic.  Patient has otherwise reassuring exam with normal sensation strength and coordination with negative finger testing.  Lungs were clear and chest is nontender.  Abdomen is nontender.  Normal pupil exam with normal extraocular movements.  Face has no droop.  Exam otherwise unremarkable.  I do not feel patient had any extremity symptoms, only speech difficulty.  She does not appear to be LVO based on this exam.  Clinical aspect patient has a stroke.  Patient will have CT scan to rule out hemorrhagic stroke and neurology be called.  Anticipate admission for MRI and further stroke work-up.  Neurology was called who will come see patient.   Patient's laboratory testing showed normal creatinine.  Electrolytes were overall reassuring.  Mild leukocytosis but no anemia.  CT head shows old infarct with chronic small vessel white matter disease.  Neurology saw the patient and is recommending admission for further stroke work-up.  Hospitalist team called for admission as patient's PCP is Dr. Virgina Jock with [facility].     Final Clinical Impressions(s) / ED Diagnoses   Final diagnoses:  Expressive aphasia    ED Discharge Orders    None      Clinical Impression: 1. Expressive aphasia     Disposition: Admit  This note was prepared with assistance of Dragon voice recognition software. Occasional wrong-word or sound-a-like substitutions may have occurred due to the inherent limitations of voice recognition software.     Arpi Diebold, Gwenyth Allegra, MD 05/05/18 [phone]

## 2018-05-04 NOTE — ED Notes (Signed)
Patient transported to CT 

## 2018-05-04 NOTE — H&P (Signed)
History and Physical    [name] VVO:[id number] DOB: Jul 21, 1945 DOA: 05/04/2018  PCP: Shon Baton, MD  Patient coming from: Home  I have personally briefly reviewed patient's old medical records in [place]  Chief Complaint: Expressive aphasia  HPI: [name] is a 73 y.o. 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  History is mostly limited to yes/no questioning due to expressive aphasia, however patient is able to string together short sentences infrequently.  She says she was at her usual state of health until around 4 PM today, 05/04/2018, when she went to use the telephone and realized she was unable to speak.  She says she is able to understand others when spoken to and knows what she wants to say however cannot get the appropriate words out.  She denies any associated headache, lightheadedness, dizziness, change in vision, chest pain, palpitations, dyspnea, fevers, chills, diaphoresis, abdominal pain, or new weakness in her arms or legs.  Patient has been taking aspirin 81 mg daily.  She is a current smoker about 1 pack/day.  She denies any alcohol or illicit drug use.  She had an implantable loop recorder placed at time of prior cryptogenic stroke in March 2018.  Device checks since that time not showing any arrhythmia with most recent check on 04/29/2018.  Per ED documentation, family spoke to her last at 47 PM 05/03/2018 at which time she appeared to be at her baseline.  We did check on her today and she was aphasic and confused.  EMS were called and she was brought to the ED.  ED Course:  Initial vitals in the ED showed BP 194/88, pulse 93, RR 21, temp 98.4 Fahrenheit, SPO2 97% on room air.  Labs are notable for WBC  13.6, hemoglobin 14.1, platelets 214, BUN 17, creatinine 0.99.  CT head without contrast showed an old right PCA territory infarct without acute intracranial hemorrhage, hydrocephalus, mass lesion, or fluid collection.  Atrophy with chronic small vessel white matter ischemic disease was noted.  Neurology were consulted and recommended admission for stroke work-up.  Patient was noted to have failed stroke swallow evaluation.  Review of Systems: As per HPI otherwise 10 point review of systems negative.    Past Medical History:  Diagnosis Date  . Asthma   . Cancer ([place])   . Depression   . Hypertension   . Peripheral vascular disease ([place])   . Pneumonia   . Sleep apnea   . Stroke [facility])     Past Surgical History:  Procedure Laterality Date  . DIRECT LARYNGOSCOPY Left 06/29/2016   Procedure: WIDE EXCISION LEFT TONGUE CANCER DIRECT LARYNGOSCOPY;  Surgeon: [name], MD;  Location: [facility] OR;  Service: ENT;  Laterality: Left;  . ESOPHAGOSCOPY N/A 06/29/2016   Procedure: ESOPHAGOSCOPY/BRONCHOSCOPY;  Surgeon: [name], MD;  Location: [place];  Service: ENT;  Laterality: N/A;  . EYE SURGERY Bilateral 2016  . KNEE SURGERY Right 2014  . LOOP RECORDER INSERTION N/A 03/21/2016   Procedure: Loop Recorder Insertion;  Surgeon: Will Meredith Leeds, MD;  Location: [place] CV LAB;  Service: Cardiovascular;  Laterality: N/A;  . PARS PLANA VITRECTOMY Right 02/28/2014   Procedure: PARS PLANA VITRECTOMY 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 WITH TAP AND ANTIBIOTIC INJECTION;  Surgeon: Hurman Horn, MD;  Location: [facility] OR;  Service: Ophthalmology;  Laterality: Right;  . TEE WITHOUT CARDIOVERSION N/A 03/21/2016   Procedure: TRANSESOPHAGEAL ECHOCARDIOGRAM (TEE);  Surgeon: Skeet Latch, MD;  Location: [facility] ENDOSCOPY;  Service: Cardiovascular;  Laterality: N/A;     reports that she quit smoking about 23 months ago. She has never used smokeless tobacco. She reports that she does not drink alcohol or use drugs.   Allergies  Allergen Reactions  . Codeine Itching    Mouth broke out  . Neosporin [Neomycin-Bacitracin Zn-Polymyx] Itching and Swelling    swelling at application site    Family History  Problem Relation Age of Onset  . Cancer Mother        colon  . Stroke Father   . Heart disease Brother      Prior to Admission medications   Medication Sig Start Date End Date Taking? Authorizing Provider  albuterol (PROVENTIL) (2.5 MG/3ML) 0.083% nebulizer solution Take 2.5 mg by nebulization every 6 (six) hours as needed for wheezing or shortness of breath.    [provider]  amLODipine (NORVASC) 10 MG tablet Take 1 tablet (10 mg total) by mouth daily. 03/21/16   Hosie Poisson, MD  aspirin 81 MG tablet Take 1 tablet (81 mg total) by mouth daily. 11/08/16   Ward Givens, NP  atorvastatin (LIPITOR) 10 MG tablet Take 10 mg by mouth daily.  03/16/16   [provider]  azelastine (ASTELIN) 0.1 % nasal spray Place 1 spray into both nostrils 2 (two) times daily as needed for allergies. Use in each nostril as directed    [provider]  budesonide-formoterol (SYMBICORT) 80-4.5 MCG/ACT inhaler Inhale 2 puffs into the lungs 2 (two) times daily as needed (FOR RESPIRATORY ISSUES.).    [provider]  Cholecalciferol (VITAMIN D) 2000 units CAPS Take 2,000 Units by mouth daily.    [provider]  escitalopram (LEXAPRO) 20 MG tablet Take 20 mg by mouth daily.  07/10/14   [provider]  Melatonin 3 MG CAPS Take 3 mg by mouth at bedtime as needed (for sleep.).     [provider]  Omega-3 Fatty Acids (FISH OIL) 1200 MG CAPS Take 1,200 mg by mouth daily.    [provider]    Physical Exam: Vitals:   05/04/18 2030 05/04/18 2045 05/04/18 2100 05/04/18 2115  BP: (!) 173/78 (!) 197/103    Pulse: 84 (!) 112 94 98  Resp: (!) 21 20 (!) 21 (!) 22  Temp:      TempSrc:      SpO2: 95% 97% 96% 97%  Weight:      Height:         Constitutional: Thin elderly woman sitting up in bed, NAD, calm, comfortable Eyes: PERRL, EOMI, lids and conjunctivae normal ENMT: Mucous membranes are dry. Posterior pharynx clear of any exudate or lesions. Normal dentition.  Neck: normal, supple, no masses. Respiratory: clear to auscultation bilaterally, no wheezing, no crackles. Normal respiratory effort. No accessory muscle use.  Cardiovascular: Regular rate and rhythm, no murmurs / rubs / gallops. No extremity edema.  Abdomen: no tenderness, no masses palpated. No hepatosplenomegaly. Bowel sounds positive.  Musculoskeletal: no clubbing / cyanosis. No joint deformity upper and lower extremities. Good ROM, no contractures. Normal muscle tone.  Skin: no rashes, lesions, ulcers. No induration Neurologic: Expressive aphasia otherwise CN 2-12 grossly intact. Sensation intact, DTR normal. Strength 5/5 in all 4.  Psychiatric: Awake and alert.  Formal orientation questions difficulty yesterday to expressive aphasia however  appears to be fully over her situation.  Normal mood.     Labs on Admission: I have personally reviewed following labs and imaging studies  CBC: Recent Labs  Lab 05/04/18 1919  WBC 13.6*  NEUTROABS 10.8*  HGB 14.1  HCT 43.7  MCV 94.8  PLT 834   Basic Metabolic Panel: Recent Labs  Lab 05/04/18 1919 05/04/18 1928  NA 138  --   K 3.9  --   CL 104  --   CO2 22  --   GLUCOSE 108*  --   BUN 17  --   CREATININE 0.99 0.80  CALCIUM 9.2  --    GFR: Estimated Creatinine Clearance: 45 mL/min (by C-G formula based on SCr of 0.8 mg/dL). Liver Function Tests: Recent Labs  Lab 05/04/18 1919  AST 23  ALT 14  ALKPHOS 72  BILITOT 0.6  PROT 6.9  ALBUMIN 4.2   No results for input(s): LIPASE, AMYLASE in the last 168 hours. No results for input(s): AMMONIA in the last 168 hours. Coagulation Profile: Recent Labs  Lab 05/04/18 1919  INR 1.0   Cardiac Enzymes: No results for input(s): CKTOTAL, CKMB, CKMBINDEX,  TROPONINI in the last 168 hours. BNP (last 3 results) No results for input(s): PROBNP in the last 8760 hours. HbA1C: No results for input(s): HGBA1C in the last 72 hours. CBG: Recent Labs  Lab 05/04/18 1926  GLUCAP 122*   Lipid Profile: No results for input(s): CHOL, HDL, LDLCALC, TRIG, CHOLHDL, LDLDIRECT in the last 72 hours. Thyroid Function Tests: No results for input(s): TSH, T4TOTAL, FREET4, T3FREE, THYROIDAB in the last 72 hours. Anemia Panel: No results for input(s): VITAMINB12, FOLATE, FERRITIN, TIBC, IRON, RETICCTPCT in the last 72 hours. Urine analysis:    Component Value Date/Time   COLORURINE STRAW (A) 03/18/2016 1328   APPEARANCEUR CLEAR 03/18/2016 1328   LABSPEC 1.003 (L) 03/18/2016 1328   PHURINE 5.0 03/18/2016 1328   GLUCOSEU NEGATIVE 03/18/2016 1328   HGBUR NEGATIVE 03/18/2016 1328   BILIRUBINUR NEGATIVE 03/18/2016 1328   KETONESUR NEGATIVE 03/18/2016 1328   PROTEINUR NEGATIVE 03/18/2016 1328   NITRITE NEGATIVE 03/18/2016 1328   LEUKOCYTESUR NEGATIVE 03/18/2016 1328    Radiological Exams on Admission: Ct Head Wo Contrast  Result Date: 05/04/2018 CLINICAL DATA:  Altered level of consciousness. EXAM: CT HEAD WITHOUT CONTRAST TECHNIQUE: Contiguous axial images were obtained from the base of the skull through the vertex without intravenous contrast. COMPARISON:  03/18/2016 FINDINGS: Brain: Old right PCA territory infarct noted. There is no evidence for acute hemorrhage, hydrocephalus, mass lesion, or abnormal extra-axial fluid collection. No definite CT evidence for acute infarction. Diffuse loss of parenchymal volume is consistent with atrophy. Patchy low attenuation in the deep hemispheric and periventricular white matter is nonspecific, but likely reflects chronic microvascular ischemic demyelination. Vascular: No hyperdense vessel or unexpected calcification. Skull: No evidence for fracture. No worrisome lytic or sclerotic lesion. Sinuses/Orbits: The visualized  paranasal sinuses and mastoid air cells are clear. Visualized portions of the globes and intraorbital fat are unremarkable. Other: None. IMPRESSION: 1. 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 No acute intracranial abnormality. 2. Atrophy with chronic small vessel white matter ischemic disease. Electronically Signed   By: Misty Stanley M.D.   On: 05/04/2018 19:41    EKG: Independently reviewed.  Sinus rhythm, LAE, no acute ischemic changes.  Assessment/Plan Principal Problem:   Expressive aphasia Active Problems:   History of stroke   Asthma   Hypertension   Depression   Tobacco use  Melissa Hurt  [name] is a 73 y.o. 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  Expressive aphasia/history of right PCA cryptogenic stroke: Initial CT head showed an old right PCA territory infarct without evidence of acute infarct.  She had a [employer] Reveal LINQ implantable loop recorder placed in March 2018 at time of previous stroke which has shown any arrhythmia on device checks since that time.  She has been on aspirin 81 mg and atorvastatin 10 mg daily.  She failed the stroke swallow screen in the ED. -Neurology consulted, CT cerebral perfusion and CTA head/neck ordered -MRI brain without contrast, her ILR is MRI compatible per discussion with MRI technologist -Obtain echocardiogram -Aspirin 300 mg rectally while n.p.o.; add Plavix when taking p.o. per neurology -Gentle IV fluids overnight while n.p.o. -Increase home atorvastatin to 40 mg daily -PT/OT/SLP eval -Check A1c and lipid panel -Allow for permissive hypertension  Hypertension: BP elevated on admission. -Hold home amlodipine to allow for permissive hypertension -PRN IV hydralazine for SBP >220 and/or DBP >120   Asthma: Currently stable without wheezing or dyspnea. -Continue home Symbicort pharmacy equivalent and as needed albuterol  Depression: -Continue Lexapro  Tobacco use: Reports smoking 1 PPD. -Smoking cessation counseling provided -Nicotine patch ordered  DVT prophylaxis: Lovenox Code Status: Full code, confirmed with patient Family Communication: None present at admission Disposition Plan: Pending further stroke work-up and PT/OT/SLP eval Consults called: Neurology Admission status: Observation   Zada Finders MD [facility] Pager [phone]  If 7PM-7AM, please contact night-coverage [email or web address]  05/04/2018, 9:48 PM

## 2018-05-04 NOTE — ED Triage Notes (Signed)
LSW 4pm yesterday. Pt arrived EMS from home where she lives alone. Family spoke with her yesterday at 4pm and states that she was baseline at this time. When the family went to check on her today they found her aphasic and confused.  (oriented to name and place) 18RAC BP 208/114 P 98 O2 98 CBG94 Able to ambulate on scene.

## 2018-05-05 ENCOUNTER — Observation Stay (HOSPITAL_BASED_OUTPATIENT_CLINIC_OR_DEPARTMENT_OTHER): Payer: Medicare Other

## 2018-05-05 ENCOUNTER — Observation Stay (HOSPITAL_COMMUNITY): Payer: Medicare Other

## 2018-05-05 DIAGNOSIS — R4701 Aphasia: Secondary | ICD-10-CM | POA: Diagnosis not present

## 2018-05-05 DIAGNOSIS — D72829 Elevated white blood cell count, unspecified: Secondary | ICD-10-CM | POA: Diagnosis present

## 2018-05-05 DIAGNOSIS — I340 Nonrheumatic mitral (valve) insufficiency: Secondary | ICD-10-CM | POA: Diagnosis not present

## 2018-05-05 DIAGNOSIS — I639 Cerebral infarction, unspecified: Secondary | ICD-10-CM | POA: Diagnosis present

## 2018-05-05 DIAGNOSIS — Z85810 Personal history of malignant neoplasm of tongue: Secondary | ICD-10-CM | POA: Diagnosis not present

## 2018-05-05 DIAGNOSIS — R2681 Unsteadiness on feet: Secondary | ICD-10-CM | POA: Diagnosis present

## 2018-05-05 DIAGNOSIS — Z87891 Personal history of nicotine dependence: Secondary | ICD-10-CM

## 2018-05-05 DIAGNOSIS — G4733 Obstructive sleep apnea (adult) (pediatric): Secondary | ICD-10-CM | POA: Diagnosis present

## 2018-05-05 DIAGNOSIS — R7303 Prediabetes: Secondary | ICD-10-CM | POA: Diagnosis present

## 2018-05-05 DIAGNOSIS — I361 Nonrheumatic tricuspid (valve) insufficiency: Secondary | ICD-10-CM

## 2018-05-05 DIAGNOSIS — F329 Major depressive disorder, single episode, unspecified: Secondary | ICD-10-CM

## 2018-05-05 DIAGNOSIS — I10 Essential (primary) hypertension: Secondary | ICD-10-CM

## 2018-05-05 DIAGNOSIS — H53462 Homonymous bilateral field defects, left side: Secondary | ICD-10-CM | POA: Diagnosis present

## 2018-05-05 DIAGNOSIS — I69398 Other sequelae of cerebral infarction: Secondary | ICD-10-CM | POA: Diagnosis not present

## 2018-05-05 DIAGNOSIS — R482 Apraxia: Secondary | ICD-10-CM | POA: Diagnosis present

## 2018-05-05 DIAGNOSIS — R0902 Hypoxemia: Secondary | ICD-10-CM | POA: Diagnosis not present

## 2018-05-05 DIAGNOSIS — J449 Chronic obstructive pulmonary disease, unspecified: Secondary | ICD-10-CM | POA: Diagnosis present

## 2018-05-05 DIAGNOSIS — I63412 Cerebral infarction due to embolism of left middle cerebral artery: Secondary | ICD-10-CM | POA: Diagnosis present

## 2018-05-05 DIAGNOSIS — R402142 Coma scale, eyes open, spontaneous, at arrival to emergency department: Secondary | ICD-10-CM | POA: Diagnosis present

## 2018-05-05 DIAGNOSIS — Z8249 Family history of ischemic heart disease and other diseases of the circulatory system: Secondary | ICD-10-CM | POA: Diagnosis not present

## 2018-05-05 DIAGNOSIS — R402252 Coma scale, best verbal response, oriented, at arrival to emergency department: Secondary | ICD-10-CM | POA: Diagnosis present

## 2018-05-05 DIAGNOSIS — E785 Hyperlipidemia, unspecified: Secondary | ICD-10-CM | POA: Diagnosis present

## 2018-05-05 DIAGNOSIS — R402362 Coma scale, best motor response, obeys commands, at arrival to emergency department: Secondary | ICD-10-CM | POA: Diagnosis present

## 2018-05-05 DIAGNOSIS — Z95818 Presence of other cardiac implants and grafts: Secondary | ICD-10-CM | POA: Diagnosis not present

## 2018-05-05 DIAGNOSIS — I739 Peripheral vascular disease, unspecified: Secondary | ICD-10-CM | POA: Diagnosis present

## 2018-05-05 DIAGNOSIS — Z823 Family history of stroke: Secondary | ICD-10-CM | POA: Diagnosis not present

## 2018-05-05 DIAGNOSIS — G8191 Hemiplegia, unspecified affecting right dominant side: Secondary | ICD-10-CM | POA: Diagnosis present

## 2018-05-05 LAB — BASIC METABOLIC PANEL
Anion gap: 11 (ref 5–15)
BUN: 13 mg/dL (ref 8–23)
CO2: 22 mmol/L (ref 22–32)
Calcium: 9.1 mg/dL (ref 8.9–10.3)
Chloride: 105 mmol/L (ref 98–111)
Creatinine, Ser: 0.9 mg/dL (ref 0.44–1.00)
GFR calc Af Amer: >60 mL/min (ref >60)
GFR calc non Af Amer: >60 mL/min (ref >60)
Glucose, Bld: 124 mg/dL — ABNORMAL HIGH (ref 70–99)
Potassium: 4 mmol/L (ref 3.5–5.1)
Sodium: 138 mmol/L (ref 135–145)

## 2018-05-05 LAB — CBC
HCT: 40.5 % (ref 36.0–46.0)
Hemoglobin: 13.1 g/dL (ref 12.0–15.0)
MCH: 29.6 pg (ref 26.0–34.0)
MCHC: 32.3 g/dL (ref 30.0–36.0)
MCV: 91.6 fL (ref 80.0–100.0)
Platelets: 213 10*3/uL (ref 150–400)
RBC: 4.42 MIL/uL (ref 3.87–5.11)
RDW: 13.2 % (ref 11.5–15.5)
WBC: 11.5 10*3/uL — ABNORMAL HIGH (ref 4.0–10.5)
nRBC: 0 % (ref 0.0–0.2)

## 2018-05-05 LAB — LIPID PANEL
Cholesterol: 158 mg/dL (ref 0–200)
HDL: 64 mg/dL (ref >40)
LDL Cholesterol: 86 mg/dL (ref 0–99)
Total CHOL/HDL Ratio: 2.5 RATIO
Triglycerides: 38 mg/dL (ref <150)
VLDL: 8 mg/dL (ref 0–40)

## 2018-05-05 LAB — ECHOCARDIOGRAM LIMITED
Height: 65 in
Weight: 1763.68 oz

## 2018-05-05 MED ORDER — AMLODIPINE BESYLATE 10 MG PO TABS
10.0000 mg | ORAL_TABLET | Freq: Every day | ORAL | Status: DC
  Administered 2018-05-05 – 2018-05-08 (×4): 10 mg via ORAL
  Filled 2018-05-05: qty 2
  Filled 2018-05-05 (×3): qty 1

## 2018-05-05 MED ORDER — ONDANSETRON HCL 4 MG/2ML IJ SOLN
4.0000 mg | Freq: Four times a day (QID) | INTRAMUSCULAR | Status: DC | PRN
  Administered 2018-05-05 – 2018-05-06 (×2): 4 mg via INTRAVENOUS
  Filled 2018-05-05 (×3): qty 2

## 2018-05-05 NOTE — Evaluation (Signed)
Clinical/Bedside Swallow Evaluation Patient Details  Name: [name] MRN: [id number] Date of Birth: January 30, 1945  Today's Date: 05/05/2018 Time: SLP Start Time (ACUTE ONLY): 1000 SLP Stop Time (ACUTE ONLY): 1025 SLP Time Calculation (min) (ACUTE ONLY): 25 min  Past Medical History:  Past Medical History:  Diagnosis Date  . Asthma   . Cancer ([place])   . Depression   . Hypertension   . Peripheral vascular disease ([place])   . Pneumonia   . Sleep apnea   . Stroke [facility])    Past Surgical History:  Past Surgical History:  Procedure Laterality Date  . DIRECT LARYNGOSCOPY Left 06/29/2016   Procedure: WIDE EXCISION LEFT TONGUE CANCER DIRECT LARYNGOSCOPY;  Surgeon: [name], MD;  [facility] OR;  Service: ENT;  Laterality: Left;  . ESOPHAGOSCOPY N/A 06/29/2016   Procedure: ESOPHAGOSCOPY/BRONCHOSCOPY;  Surgeon: [name], MD;  Location: [place];  Service: ENT;  Laterality: N/A;  . EYE SURGERY Bilateral 2016  . KNEE SURGERY Right 2014  . LOOP RECORDER INSERTION N/A 03/21/2016   Procedure: Loop Recorder Insertion;  Surgeon: Will Meredith Leeds, MD;  Location: [place] CV LAB;  Service: Cardiovascular;  Laterality: N/A;  . PARS PLANA VITRECTOMY Right 02/28/2014   Procedure: PARS PLANA VITRECTOMY 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 WITH TAP AND ANTIBIOTIC INJECTION;  Surgeon: Hurman Horn, MD;  Location: [place];  Service: Ophthalmology;  Laterality: Right;  . TEE WITHOUT CARDIOVERSION N/A 03/21/2016   Procedure: TRANSESOPHAGEAL ECHOCARDIOGRAM (TEE);  Surgeon: Skeet Latch, MD;  Location: [facility] ENDOSCOPY;  Service: Cardiovascular;  Laterality: N/A;   HPI:  Pt is a 73 y/o female who presents to the ED after family found her aphasic and confused. 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 PMH significant for prior CVA, PVD, HTN, CA, asthma.   Assessment / Plan / Recommendation Clinical Impression  Patient presents with mild oropharyngeal  dysphagia with only one instance of dry coug observed after drinking first sip of thin liquids. Patient did exhibit mild amount of residuals of graham cracker in right buccal cavity but this was cleared with sip of water. Although patient did fine during this test, RN reported that she vomitted a little while after, which could indicate esophageal component. SLP Visit Diagnosis: Dysphagia, unspecified (R13.10)    Aspiration Risk  Mild aspiration risk    Diet Recommendation Thin liquid;Regular   Liquid Administration via: Cup;Straw Medication Administration: Whole meds with liquid Supervision: Patient able to self feed Compensations: Minimize environmental distractions;Slow rate;Small sips/bites Postural Changes: Seated upright at 90 degrees    Other  Recommendations Recommended Consults: Consider GI evaluation(if vomitting continues) Oral Care Recommendations: Oral care BID   Follow up Recommendations Other (comment)(none for dysphagia, might need SLP for language)      Frequency and Duration min 1 x/week  1 week       Prognosis Prognosis for Safe Diet Advancement: Good      Swallow Study   General Date of Onset: 05/04/18 HPI: Pt is a 73 y/o female who presents to the ED after family found her aphasic and confused. 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 PMH significant for prior CVA, PVD, HTN, CA, asthma. Type of Study: Bedside Swallow Evaluation Previous Swallow Assessment: N/A Diet Prior to this Study: NPO Temperature Spikes Noted: No Respiratory Status: Room air History of Recent Intubation: No Behavior/Cognition: Cooperative;Alert;Pleasant mood Oral Cavity Assessment: Within Functional Limits Oral Care Completed by SLP: No Oral Cavity -  Dentition: Dentures, bottom;Dentures, top Self-Feeding Abilities: Able to feed self Patient Positioning: Upright in bed Baseline Vocal Quality: Normal Volitional Cough:  Weak Volitional Swallow: Able to elicit    Oral/Motor/Sensory Function Overall Oral Motor/Sensory Function: Mild impairment Facial ROM: Reduced right Facial Symmetry: Abnormal symmetry right Facial Strength: Within Functional Limits Facial Sensation: Within Functional Limits Lingual ROM: Within Functional Limits Lingual Symmetry: Within Functional Limits Lingual Strength: Within Functional Limits Velum: Within Functional Limits Mandible: Within Functional Limits   Ice Chips Ice chips: Not tested   Thin Liquid Thin Liquid: Within functional limits Presentation: Straw;Cup Other Comments: one instance of dry cough after first sip of thin liquids observed, no other overt s/s of aspiration or penetration    Nectar Thick     Honey Thick     Puree Puree: Within functional limits   Solid     Solid: Impaired Oral Phase Functional Implications: Other (comment)(mild residuals right buccal cavity)      [name] 05/05/2018,6:11 PM   Sonia Baller, MA, CCC-SLP Speech Therapy [facility] Pager: [phone]

## 2018-05-05 NOTE — Evaluation (Addendum)
Occupational Therapy Evaluation Patient Details Name: [name] MRN: [id number] DOB: 11/18/1945 Today's Date: 05/05/2018    History of Present Illness Pt is a 73 y/o female who presents to the ED after family found her aphasic and confused. 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 PMH significant for prior CVA, PVD, HTN, CA, asthma.   Clinical Impression   Pt admitted with above. She demonstrates the below listed deficits and will benefit from continued OT to maximize safety and independence with BADLs,  Pt presents to OT with significant expressive deficits which makes full assessment of cognition difficult.  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  She currently requires supervision for ADLs.  She lives alone and indicates she has no family, and recently fired her caregiver, she indicates she has no assistance at discharge.   She likely will be able to perform ADLs at mod I at discharge, but will need assistance with financial management (she is unable to write checks, nor communicate her needs fully), medication management, shopping assistance and meal prep.   Likely would benefit from SNF  And may then need to transition to ALF   If she is unable to hire assistance.   **Pt with projectile vomiting at end of session x 3 - RN notified and in room upon OT departure.        Follow Up Recommendations  SNF   Equipment Recommendations  None recommended by OT    Recommendations for Other Services       Precautions / Restrictions Precautions Precautions: Fall Precaution Comments: Aphasic Restrictions Weight Bearing Restrictions: No      Mobility Bed Mobility Overal bed mobility: Modified Independent             General bed mobility comments: HOB slightly raised. No use of rails required for pt to transition to EOB.   Transfers Overall transfer level: Needs  assistance Equipment used: None Transfers: Sit to/from [facility] Sit to Stand: Modified independent (Device/Increase time) Stand pivot transfers: Modified independent (Device/Increase time)            Balance Overall balance assessment: Needs assistance Sitting-balance support: Feet supported;No upper extremity supported Sitting balance-Leahy Scale: Good     Standing balance support: No upper extremity supported;During functional activity Standing balance-Leahy Scale: Good                             ADL either performed or assessed with clinical judgement   ADL Overall ADL's : Needs assistance/impaired Eating/Feeding: Independent   Grooming: Wash/dry hands;Wash/dry face;Oral care;Brushing hair;Supervision/safety;Standing   Upper Body Bathing: Supervision/ safety;Set up;Sitting   Lower Body Bathing: Supervison/ safety;Sit to/from stand   Upper Body Dressing : Supervision/safety;Sitting   Lower Body Dressing: Supervision/safety;Sit to/from stand   Toilet Transfer: Supervision/safety;Ambulation;Comfort height toilet   Toileting- Clothing Manipulation and Hygiene: Supervision/safety;Sit to/from stand       Functional mobility during ADLs: Supervision/safety       Vision Patient Visual Report: Peripheral vision impairment Additional Comments: Pt with h/o Lt HH      Perception Perception Perception Tested?: Yes   Praxis Praxis Praxis tested?: Within functional limits    Pertinent Vitals/Pain Faces Pain Scale: No hurt     Hand Dominance Right   Extremity/Trunk Assessment Upper Extremity Assessment Upper Extremity Assessment: Overall WFL for tasks assessed   Lower Extremity Assessment  Lower Extremity Assessment: Defer to PT evaluation   Cervical / Trunk Assessment Cervical / Trunk Assessment: Normal   Communication Communication Communication: Expressive difficulties   Cognition Arousal/Alertness: Awake/alert Behavior  During Therapy: WFL for tasks assessed/performed Overall Cognitive Status: Difficult to assess                                 General Comments: Per outpatient neurological notes, pt had short term memory deficits since prior stroke    General Comments  Pt with projectile vomiting x 3 at end of session. RN notified and in the room upon OT departure.  Assisted pt with clean up.  BP 208/94    Exercises     Shoulder Instructions      Home Living Family/patient expects to be discharged to:: Private residence Living Arrangements: Alone Available Help at Discharge: Family;Available 24 hours/day Type of Home: House Home Access: Stairs to enter [employer] of Steps: 1(threshold step)   Home Layout: One level     Bathroom Shower/Tub: Occupational psychologist: Standard     Home Equipment: Environmental consultant - 2 wheels;Shower seat;Grab bars - tub/shower   Additional Comments: Pt indicates she had a caregiver, but possibly fired them recently (difficult to determine due to communication deficits).   Per chart review, it does appear that pt lived alone and did have a hired caregiver       Prior Functioning/Environment Level of Independence: Independent        Comments: Pt does not drive, she reports she does not cook.  She indicates she had assist for meal prep and groceries.  Has h/o Lt HH         OT Problem List: Decreased activity tolerance;Decreased cognition;Decreased safety awareness      OT Treatment/Interventions: Self-care/ADL training;DME and/or AE instruction;Therapeutic activities;Cognitive remediation/compensation;Visual/perceptual remediation/compensation;Patient/family education    OT Goals(Current goals can be found in the care plan section) Acute Rehab OT Goals Patient Stated Goal: Pt unable to state  OT Goal Formulation: With patient Potential to Achieve Goals: Good  OT Frequency: Min 2X/week   Barriers to D/C: Decreased caregiver  support  Pt has no caregiver support        Co-evaluation              AM-PAC OT "6 Clicks" Daily Activity     Outcome Measure Help from another person eating meals?: None Help from another person taking care of personal grooming?: A Little Help from another person toileting, which includes using toliet, bedpan, or urinal?: A Little Help from another person bathing (including washing, rinsing, drying)?: A Little Help from another person to put on and taking off regular upper body clothing?: A Little Help from another person to put on and taking off regular lower body clothing?: A Little 6 Click Score: 19   End of Session Nurse Communication: Mobility status  Activity Tolerance: Patient tolerated treatment well Patient left: in bed;with call bell/phone within reach;with bed alarm set;with nursing/sitter in room  OT Visit Diagnosis: Cognitive communication deficit (R41.841) Symptoms and signs involving cognitive functions: Cerebral infarction                Time: 1120-1206 OT Time Calculation (min): 46 min Charges:  OT General Charges $OT Visit: 1 Visit OT Evaluation $OT Eval Moderate Complexity: 1 Mod OT Treatments $Self Care/Home Management : 23-37 mins  Lucille Passy, OTR/L Acute Rehabilitation Services Pager [phone] Office [phone]  Lucille Passy M 05/05/2018, 2:41 PM

## 2018-05-05 NOTE — Progress Notes (Signed)
Progress Note    [name]  KDX:[id number] DOB: 01-11-1946  DOA: 05/04/2018 PCP: Shon Baton, MD    Brief Narrative:   Chief complaint: Difficulty speaking  Medical records reviewed and are as summarized below:  [name] is an 73 y.o. female with past history significant for right PCA stroke, HTN, HLD, asthma, depression, SCC of tongue s/p excision, and tobacco use; who presents with expressive aphasia.  Assessment/Plan:   Principal Problem:   Expressive aphasia Active Problems:   History of stroke   Asthma   Hypertension   Depression   Tobacco use   Stroke [facility])  Expressive aphasia secondary to acute/subacute CVA, history of right PCA stroke: Patient presents with inability to get express what she wants to say. MRI revealing left insula and perisylvian inferior frontal gyrus strokes.  LDL not at goal.  Hemoglobin A1c 6.  PT/OT/speech recommending home health services at discharge.  Echocardiogram noted EF of 50 to 55% with impaired relaxation.  A mild decrease in function from previous echocardiogram in 2018 where EF was noted to be 55 to 60%..  Patient previously had been on aspirin alone after previous stroke right PCA stroke in March 2018.  At that time she also had a loop recorder placed.  Loop recorder last interrogated on 4/12 by Dr. Curt Bears with normal battery and no arrhythmias noted. -Continue PT/OT/speech -Continue statin -Aspirin and Plavix for 3 weeks, then continue Plavix alone  -Interrogate loop recorder -Appreciate neurology consultative services, will follow up with any further recommendation  Essential hypertension: Blood pressures elevated up to 208/94.  Antihypertensives of amlodipine were initially held. -Restart amlodipine -Hydralazine IV as needed   Hyperlipidemia: Total cholesterol 158, HDL 64, LDL 86, triglycerides 38.  Goal LDL less than 70.  Patient had been on atorvastatin 10 mg daily. -Atorvastatin 40 mg  daily  Leukocytosis: Acute.  WBC elevated at 13.6 on admission.  Appears to be trending down currently 11.5.  No signs of fever overnight. -Continue to monitor  History of asthma/suspected COPD: Patient currently stable without any wheezes appreciated. -Continue pharmacy substitution for Symbicort  Prediabetes: Hemoglobin A1c 6.  Patient not on any diabetic medications at home  Depression: On physical exam patient appears to be somewhat pressed. -Continue Lexapro  Tobacco use: Patient reported quitting smoking approximately 2 years ago. -Counseled on the need of continued cessation of tobacco use  Body mass index is 18.34 kg/m.   Family Communication/Anticipated D/C date and plan/Code Status   DVT prophylaxis: Lovenox ordered. Code Status: Full Code.  Family Communication: No family present at bedside Disposition Plan: likely discharge home in a.m.   Medical Consultants:    Neurology   Anti-Infectives:    None  Subjective:   Patient reports  Objective:    Vitals:   05/05/18 0601 05/05/18 1010 05/05/18 1155 05/05/18 1516  BP: (!) 176/75 (!) 168/75 (!) 208/94 (!) 188/75  Pulse: 89 81 90 89  Resp: 16 16  19   Temp: 98.4 F (36.9 C) 98 F (36.7 C)  98.1 F (36.7 C)  TempSrc: Oral Oral  Oral  SpO2: 95% 96% 95% 95%  Weight:      Height:        Intake/Output Summary (Last 24 hours) at 05/05/2018 1929 Last data filed at 05/05/2018 1855 Gross per 24 hour  Intake 723.2 ml  Output --  Net 723.2 ml   Filed Weights   05/04/18 1933 05/04/18 2221  Weight: 53.5 kg 50 kg  Exam: Constitutional: Elderly female in NAD, calm, comfortable.  Able to follow commands. Eyes: PERRL, lids and conjunctivae normal ENMT: Mucous membranes are moist. Posterior pharynx clear of any exudate or lesions. Neck: normal, supple, no masses, no thyromegaly Respiratory: Decreased aeration noted but no significant wheezes or rhonchi appreciated.. Normal respiratory effort. No  accessory muscle use.  Cardiovascular: Regular rate and rhythm, no murmurs / rubs / gallops. No extremity edema. 2+ pedal pulses. No carotid bruits.  Abdomen: no tenderness, no masses palpated. No hepatosplenomegaly. Bowel sounds positive.  Musculoskeletal: no clubbing / cyanosis. No joint deformity upper and lower extremities. Good ROM, no contractures. Normal muscle tone.  Skin: no rashes, lesions, ulcers. No induration Neurologic: CN 2-12 grossly intact.  Expressive aphasia.  Strength 4+/5 on right upper /lower extremity, and strength 5/5 on the left.  Apraxia noted as well when asked to write. Psychiatric: Normal judgment and insight. Alert and oriented x 3.  Depressed mood.    Data Reviewed:   I have personally reviewed following labs and imaging studies:  Labs: Labs show the following:   Basic Metabolic Panel: Recent Labs  Lab 05/04/18 1919 05/04/18 1928 05/05/18 0224  NA 138  --  138  K 3.9  --  4.0  CL 104  --  105  CO2 22  --  22  GLUCOSE 108*  --  124*  BUN 17  --  13  CREATININE 0.99 0.80 0.90  CALCIUM 9.2  --  9.1   GFR Estimated Creatinine Clearance: 43.9 mL/min (by C-G formula based on SCr of 0.9 mg/dL). Liver Function Tests: Recent Labs  Lab 05/04/18 1919  AST 23  ALT 14  ALKPHOS 72  BILITOT 0.6  PROT 6.9  ALBUMIN 4.2   No results for input(s): LIPASE, AMYLASE in the last 168 hours. No results for input(s): AMMONIA in the last 168 hours. Coagulation profile Recent Labs  Lab 05/04/18 1919  INR 1.0    CBC: Recent Labs  Lab 05/04/18 1919 05/05/18 0224  WBC 13.6* 11.5*  NEUTROABS 10.8*  --   HGB 14.1 13.1  HCT 43.7 40.5  MCV 94.8 91.6  PLT 214 213   Cardiac Enzymes: No results for input(s): CKTOTAL, CKMB, CKMBINDEX, TROPONINI in the last 168 hours. BNP (last 3 results) No results for input(s): PROBNP in the last 8760 hours. CBG: Recent Labs  Lab 05/04/18 1926  GLUCAP 122*   D-Dimer: No results for input(s): DDIMER in the last 72  hours. Hgb A1c: Recent Labs    05/04/18 2126  HGBA1C 6.0*   Lipid Profile: Recent Labs    05/05/18 0224  CHOL 158  HDL 64  LDLCALC 86  TRIG 38  CHOLHDL 2.5   Thyroid function studies: No results for input(s): TSH, T4TOTAL, T3FREE, THYROIDAB in the last 72 hours.  Invalid input(s): FREET3 Anemia work up: No results for input(s): VITAMINB12, FOLATE, FERRITIN, TIBC, IRON, RETICCTPCT in the last 72 hours. Sepsis Labs: Recent Labs  Lab 05/04/18 1919 05/05/18 0224  WBC 13.6* 11.5*    Microbiology No results found for this or any previous visit (from the past 240 hour(s)).  Procedures and diagnostic studies:  Echocardiogram on 05/05/2018: Impression 1. The left ventricle has low normal systolic function, with an ejection fraction of 50-55%. The cavity size was normal. Left ventricular diastolic Doppler parameters are consistent with impaired relaxation. No evidence of left ventricular regional wall  motion abnormalities.  2. The right ventricle has normal systolic function. The cavity was normal. There is  no increase in right ventricular wall thickness.  Ct Angio Head W Or Wo Contrast  Result Date: 05/04/2018 CLINICAL DATA:  Aphasia.  Right arm weakness. EXAM: CT ANGIOGRAPHY HEAD AND NECK CT PERFUSION BRAIN TECHNIQUE: Multidetector CT imaging of the head and neck was performed using the standard protocol during bolus administration of intravenous contrast. Multiplanar CT image reconstructions and MIPs were obtained to evaluate the vascular anatomy. Carotid stenosis measurements (when applicable) are obtained utilizing NASCET criteria, using the distal internal carotid diameter as the denominator. Multiphase CT imaging of the brain was performed following IV bolus contrast injection. Subsequent parametric perfusion maps were calculated using RAPID software. CONTRAST:  1103mL OMNIPAQUE IOHEXOL 350 MG/ML SOLN COMPARISON:  Head and neck MRA 03/18/2016 FINDINGS: CTA NECK FINDINGS Aortic  arch: Standard 3 vessel aortic arch with moderate atherosclerotic plaque. Prominent soft plaque in the proximal left subclavian artery results in just under 50% stenosis. Right carotid system: Patent with calcified plaque at the carotid bifurcation and in the distal cervical ICA without evidence of significant stenosis or dissection. Left carotid system: Patent with moderate volume calcified plaque at the carotid bifurcation without evidence of significant stenosis or dissection. Vertebral arteries: Chronic right vertebral artery occlusion near its origin with only minimal intermittent reconstitution in the neck. Patent left vertebral artery with chronic mild-to-moderate V1 segment stenosis. Skeleton: Mild cervical spondylosis. Moderate cervical facet arthrosis. Other neck: No mass or enlarged lymph nodes. Upper chest: Biapical pleuroparenchymal lung scarring. Mild centrilobular emphysema. Review of the MIP images confirms the above findings CTA HEAD FINDINGS Anterior circulation: The internal carotid arteries are patent from skull base to carotid termini with mild-to-moderate plaque bilaterally not resulting in significant stenosis. ACAs and MCAs are patent without significant A1 or M1 stenosis. There is new occlusion of a proximal left M2 branch vessel with distal reconstitution. No aneurysm is identified. Posterior circulation: The intracranial left vertebral artery is widely patent and supplies the basilar. There is retrograde opacification of the right vertebral artery with patent PICAs bilaterally. The basilar artery is widely patent. There is a robust left posterior communicating artery. The left PCA is patent with mild branch vessel irregularity. The right PCA is occluded at the distal P1 level, more proximally than on the 2018 MRA where there was occlusion beginning at the mid P2 level. No aneurysm is identified. Venous sinuses: Patent. Anatomic variants: None. Review of the MIP images confirms the above  findings CT Brain Perfusion Findings: ASPECTS: 9 upon reassessment of earlier noncontrast CT with small region of acute infarct in the left insula CBF (<30%) Volume: 39mL Perfusion (Tmax>6.0s) volume: 23mL Mismatch Volume: 42mL, though with caveat that automated analysis did not detect the early small infarct apparent in the left insula on noncontrast CT Infarction Location: No core infarct. Delayed perfusion/penumbra in the left MCA territory involving the posterior frontal and anterior parietal lobes including in the operculum. IMPRESSION: 1. Proximal left M2 occlusion with distal reconstitution. 2. Left MCA ischemia with penumbra as detailed above. ASPECTS of 9 upon reassessment of earlier noncontrast CT. 3. 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 4. Chronic 50% proximal left subclavian and mild-to-moderate proximal left vertebral artery stenoses. 5. No significant carotid artery stenosis. 6. Aortic Atherosclerosis (ICD10-I70.0) and Emphysema (ICD10-J43.9). These results were called by telephone at the time of interpretation on 05/04/2018 at 9:50 pm to Dr. Kerney Elbe , who verbally acknowledged these results. Electronically Signed   By: Logan Bores M.D.   On: 05/04/2018 22:20  Ct Head Wo Contrast  Result Date: 05/04/2018 CLINICAL DATA:  Altered level of consciousness. EXAM: CT HEAD WITHOUT CONTRAST TECHNIQUE: Contiguous axial images were obtained from the base of the skull through the vertex without intravenous contrast. COMPARISON:  03/18/2016 FINDINGS: Brain: Old right PCA territory infarct noted. There is no evidence for acute hemorrhage, hydrocephalus, mass lesion, or abnormal extra-axial fluid collection. No definite CT evidence for acute infarction. Diffuse loss of parenchymal volume is consistent with atrophy. Patchy low attenuation in the deep hemispheric and periventricular white matter is nonspecific, but likely reflects chronic microvascular ischemic demyelination.  Vascular: No hyperdense vessel or unexpected calcification. Skull: No evidence for fracture. No worrisome lytic or sclerotic lesion. Sinuses/Orbits: The visualized paranasal sinuses and mastoid air cells are clear. Visualized portions of the globes and intraorbital fat are unremarkable. Other: None. IMPRESSION: 1. 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 No acute intracranial abnormality. 2. Atrophy with chronic small vessel white matter ischemic disease. Electronically Signed   By: Misty Stanley M.D.   On: 05/04/2018 19:41   Ct Angio Neck W Or Wo Contrast  Result Date: 05/04/2018 CLINICAL DATA:  Aphasia.  Right arm weakness. EXAM: CT ANGIOGRAPHY HEAD AND NECK CT PERFUSION BRAIN TECHNIQUE: Multidetector CT imaging of the head and neck was performed using the standard protocol during bolus administration of intravenous contrast. Multiplanar CT image reconstructions and MIPs were obtained to evaluate the vascular anatomy. Carotid stenosis measurements (when applicable) are obtained utilizing NASCET criteria, using the distal internal carotid diameter as the denominator. Multiphase CT imaging of the brain was performed following IV bolus contrast injection. Subsequent parametric perfusion maps were calculated using RAPID software. CONTRAST:  128mL OMNIPAQUE IOHEXOL 350 MG/ML SOLN COMPARISON:  Head and neck MRA 03/18/2016 FINDINGS: CTA NECK FINDINGS Aortic arch: Standard 3 vessel aortic arch with moderate atherosclerotic plaque. Prominent soft plaque in the proximal left subclavian artery results in just under 50% stenosis. Right carotid system: Patent with calcified plaque at the carotid bifurcation and in the distal cervical ICA without evidence of significant stenosis or dissection. Left carotid system: Patent with moderate volume calcified plaque at the carotid bifurcation without evidence of significant stenosis or dissection. Vertebral arteries: Chronic right vertebral artery occlusion near its origin with only  minimal intermittent reconstitution in the neck. Patent left vertebral artery with chronic mild-to-moderate V1 segment stenosis. Skeleton: Mild cervical spondylosis. Moderate cervical facet arthrosis. Other neck: No mass or enlarged lymph nodes. Upper chest: Biapical pleuroparenchymal lung scarring. Mild centrilobular emphysema. Review of the MIP images confirms the above findings CTA HEAD FINDINGS Anterior circulation: The internal carotid arteries are patent from skull base to carotid termini with mild-to-moderate plaque bilaterally not resulting in significant stenosis. ACAs and MCAs are patent without significant A1 or M1 stenosis. There is new occlusion of a proximal left M2 branch vessel with distal reconstitution. No aneurysm is identified. Posterior circulation: The intracranial left vertebral artery is widely patent and supplies the basilar. There is retrograde opacification of the right vertebral artery with patent PICAs bilaterally. The basilar artery is widely patent. There is a robust left posterior communicating artery. The left PCA is patent with mild branch vessel irregularity. The right PCA is occluded at the distal P1 level, more proximally than on the 2018 MRA where there was occlusion beginning at the mid P2 level. No aneurysm is identified. Venous sinuses: Patent. Anatomic variants: None. Review of the MIP images confirms the above findings CT Brain Perfusion Findings: ASPECTS: 9 upon reassessment of earlier noncontrast CT with small  region of acute infarct in the left insula CBF (<30%) Volume: 18mL Perfusion (Tmax>6.0s) volume: 44mL Mismatch Volume: 52mL, though with caveat that automated analysis did not detect the early small infarct apparent in the left insula on noncontrast CT Infarction Location: No core infarct. Delayed perfusion/penumbra in the left MCA territory involving the posterior frontal and anterior parietal lobes including in the operculum. IMPRESSION: 1. Proximal left M2  occlusion with distal reconstitution. 2. Left MCA ischemia with penumbra as detailed above. ASPECTS of 9 upon reassessment of earlier noncontrast CT. 3. 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 4. Chronic 50% proximal left subclavian and mild-to-moderate proximal left vertebral artery stenoses. 5. No significant carotid artery stenosis. 6. Aortic Atherosclerosis (ICD10-I70.0) and Emphysema (ICD10-J43.9). These results were called by telephone at the time of interpretation on 05/04/2018 at 9:50 pm to Dr. Kerney Elbe , who verbally acknowledged these results. Electronically Signed   By: Logan Bores M.D.   On: 05/04/2018 22:20   Mr Brain Wo Contrast  Result Date: 05/05/2018 CLINICAL DATA:  73 y/o F; expressive aphasia. Stroke for follow-up. EXAM: MRI HEAD WITHOUT CONTRAST TECHNIQUE: Multiplanar, multiecho pulse sequences of the brain and surrounding structures were obtained without intravenous contrast. COMPARISON:  05/04/2018 CT head, CTA, CT perfusion head FINDINGS: Brain: Area of cortical reduced diffusion spanning 2.4 cm centered within the left insula and perisylvian inferior frontal gyrus compatible with acute/early subacute infarction. Stable chronic right PCA infarction. Few punctate nonspecific T2 FLAIR hyperintensities in subcortical and periventricular white matter are compatible with mild chronic microvascular ischemic changes. Mild volume loss of the brain. No extra-axial collection, hydrocephalus, mass effect, or herniation. There is mild hemosiderin staining in the right occipital cortex within the region of chronic infarction. No additional abnormal susceptibility hypointensity to indicate intracranial hemorrhage. Vascular: Normal flow voids. Skull and upper cervical spine: Normal marrow signal. Sinuses/Orbits: Partial opacification of the left mastoid air cells. No abnormal signal of the occluded paranasal sinuses or the right mastoid air cells. Bilateral intra-ocular lens  replacement. Other: None. IMPRESSION: 1. Acute/early subacute infarction of the left insula and perisylvian inferior frontal gyrus spanning 2.4 cm. No associated hemorrhage or mass effect. The infarction appears grossly stable in distribution in comparison with the visible infarct on prior CT and CTA of head given differences in technique. 2. Stable chronic right PCA infarction. 3. Mild chronic microvascular ischemic changes and volume loss of the brain. Electronically Signed   By: Kristine Garbe M.D.   On: 05/05/2018 03:35   Ct Cerebral Perfusion W Contrast  Result Date: 05/04/2018 CLINICAL DATA:  Aphasia.  Right arm weakness. EXAM: CT ANGIOGRAPHY HEAD AND NECK CT PERFUSION BRAIN TECHNIQUE: Multidetector CT imaging of the head and neck was performed using the standard protocol during bolus administration of intravenous contrast. Multiplanar CT image reconstructions and MIPs were obtained to evaluate the vascular anatomy. Carotid stenosis measurements (when applicable) are obtained utilizing NASCET criteria, using the distal internal carotid diameter as the denominator. Multiphase CT imaging of the brain was performed following IV bolus contrast injection. Subsequent parametric perfusion maps were calculated using RAPID software. CONTRAST:  150mL OMNIPAQUE IOHEXOL 350 MG/ML SOLN COMPARISON:  Head and neck MRA 03/18/2016 FINDINGS: CTA NECK FINDINGS Aortic arch: Standard 3 vessel aortic arch with moderate atherosclerotic plaque. Prominent soft plaque in the proximal left subclavian artery results in just under 50% stenosis. Right carotid system: Patent with calcified plaque at the carotid bifurcation and in the distal cervical ICA without evidence of significant stenosis  or dissection. Left carotid system: Patent with moderate volume calcified plaque at the carotid bifurcation without evidence of significant stenosis or dissection. Vertebral arteries: Chronic right vertebral artery occlusion near its  origin with only minimal intermittent reconstitution in the neck. Patent left vertebral artery with chronic mild-to-moderate V1 segment stenosis. Skeleton: Mild cervical spondylosis. Moderate cervical facet arthrosis. Other neck: No mass or enlarged lymph nodes. Upper chest: Biapical pleuroparenchymal lung scarring. Mild centrilobular emphysema. Review of the MIP images confirms the above findings CTA HEAD FINDINGS Anterior circulation: The internal carotid arteries are patent from skull base to carotid termini with mild-to-moderate plaque bilaterally not resulting in significant stenosis. ACAs and MCAs are patent without significant A1 or M1 stenosis. There is new occlusion of a proximal left M2 branch vessel with distal reconstitution. No aneurysm is identified. Posterior circulation: The intracranial left vertebral artery is widely patent and supplies the basilar. There is retrograde opacification of the right vertebral artery with patent PICAs bilaterally. The basilar artery is widely patent. There is a robust left posterior communicating artery. The left PCA is patent with mild branch vessel irregularity. The right PCA is occluded at the distal P1 level, more proximally than on the 2018 MRA where there was occlusion beginning at the mid P2 level. No aneurysm is identified. Venous sinuses: Patent. Anatomic variants: None. Review of the MIP images confirms the above findings CT Brain Perfusion Findings: ASPECTS: 9 upon reassessment of earlier noncontrast CT with small region of acute infarct in the left insula CBF (<30%) Volume: 61mL Perfusion (Tmax>6.0s) volume: 42mL Mismatch Volume: 103mL, though with caveat that automated analysis did not detect the early small infarct apparent in the left insula on noncontrast CT Infarction Location: No core infarct. Delayed perfusion/penumbra in the left MCA territory involving the posterior frontal and anterior parietal lobes including in the operculum. IMPRESSION: 1.  Proximal left M2 occlusion with distal reconstitution. 2. Left MCA ischemia with penumbra as detailed above. ASPECTS of 9 upon reassessment of earlier noncontrast CT. 3. 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 4. Chronic 50% proximal left subclavian and mild-to-moderate proximal left vertebral artery stenoses. 5. No significant carotid artery stenosis. 6. Aortic Atherosclerosis (ICD10-I70.0) and Emphysema (ICD10-J43.9). These results were called by telephone at the time of interpretation on 05/04/2018 at 9:50 pm to Dr. Kerney Elbe , who verbally acknowledged these results. Electronically Signed   By: Logan Bores M.D.   On: 05/04/2018 22:20    Medications:     stroke: mapping our early stages of recovery book   Does not apply Once   aspirin  300 mg Rectal Daily   Or   aspirin  325 mg Oral Daily   atorvastatin  40 mg Oral Daily   clopidogrel  75 mg Oral Daily   enoxaparin (LOVENOX) injection  40 mg Subcutaneous Q24H   escitalopram  20 mg Oral Daily   mometasone-formoterol  2 puff Inhalation BID   nicotine  21 mg Transdermal Q2000   Continuous Infusions:   LOS: 0 days   [name]  [facility]   *Please refer to [email or web address], password [email or web address] to get updated schedule on who will round on this patient, as hospitalists switch teams weekly. If 7PM-7AM, please contact night-coverage at [email or web address], password [email or web address] for any overnight needs.

## 2018-05-05 NOTE — Progress Notes (Signed)
Pt vomited 3x's post speech eval. Pt states she does not feel nauseous and feels better. Vitals all with in patient specific range. Pt lying in bed resting. Doctor notified. Will continue to monitor.

## 2018-05-05 NOTE — Progress Notes (Signed)
[name] was received to [place].  Pt was oriented to unit and falls education completed . Pt verbalized understanding risks associated with falls. Pt with dry scaly skin, skin tags and moles to upper torso otherwise skin intact.

## 2018-05-05 NOTE — Evaluation (Signed)
Physical Therapy Evaluation Patient Details Name: [name] MRN: [id number] DOB: [birth year] Today's Date: 05/05/2018   History of Present Illness  Pt is a 73 y/o female who presents to the ED after family found her aphasic and confused. 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 PMH significant for prior CVA, PVD, HTN, CA, asthma.  Clinical Impression  Pt admitted with above diagnosis. Pt currently with functional limitations due to the deficits listed below (see PT Problem List). At the time of PT eval pt was able to perform transfers with modified independence and ambulation with up to min guard assist. Occasionally unsteady however able to recover without assist and without an AD. Pt reports she is concerned with going home alone, but states she will be able to have assistance from family if needed. Acutely, pt will benefit from skilled PT to increase their independence and safety with mobility to allow discharge to the venue listed below.       Follow Up Recommendations Home health PT; 24 hour supervision    Equipment Recommendations  None recommended by PT    Recommendations for Other Services       Precautions / Restrictions Precautions Precautions: Fall Precaution Comments: Aphasic Restrictions Weight Bearing Restrictions: No      Mobility  Bed Mobility Overal bed mobility: Modified Independent             General bed mobility comments: HOB slightly raised. No use of rails required for pt to transition to EOB.   Transfers Overall transfer level: Needs assistance Equipment used: None Transfers: Sit to/from Stand Sit to Stand: Supervision;Modified independent (Device/Increase time)         General transfer comment: Initially supervision provided for safety however progressed to mod I by end of session.   Ambulation/Gait Ambulation/Gait assistance: Min guard Gait Distance (Feet): 150 Feet Assistive  device: None Gait Pattern/deviations: Step-through pattern;Decreased stride length;Narrow base of support Gait velocity: Decreased Gait velocity interpretation: 1.31 - 2.62 ft/sec, indicative of limited community ambulator General Gait Details: Pt ambulating fairly well without AD. Demonstrating mild decreased awareness of safety in regards to lines - PT managed lines throughout session. Occasional unsteadiness noted however pt was able to recover without assistance.   Stairs            Wheelchair Mobility    Modified Rankin (Stroke Patients Only) Modified Rankin (Stroke Patients Only) Pre-Morbid Rankin Score: No significant disability Modified Rankin: Moderately severe disability     Balance Overall balance assessment: Needs assistance Sitting-balance support: Feet supported;No upper extremity supported Sitting balance-Leahy Scale: Fair     Standing balance support: No upper extremity supported;During functional activity Standing balance-Leahy Scale: Fair Standing balance comment: Unsteady at times - occasionally reaching for external support.                              Pertinent Vitals/Pain Pain Assessment: Faces Faces Pain Scale: No hurt    Home Living Family/patient expects to be discharged to:: Private residence Living Arrangements: Alone Available Help at Discharge: Family;Available 24 hours/day Type of Home: House Home Access: Stairs to enter   [employer] of Steps: 1(threshold step) Home Layout: One level Home Equipment: Walker - 2 wheels;Shower seat;Grab bars - tub/shower      Prior Function Level of Independence: Independent         Comments: Pt reports she has not been using an AD  Hand Dominance        Extremity/Trunk Assessment   Upper Extremity Assessment Upper Extremity Assessment: Defer to OT evaluation    Lower Extremity Assessment Lower Extremity Assessment: RLE deficits/detail;LLE deficits/detail RLE  Deficits / Details: Quads, hamstrings, hip flexors, grossly 4/5 LLE Deficits / Details: Quads, hamstrings, hip flexors, grossly 4+/5    Cervical / Trunk Assessment Cervical / Trunk Assessment: Normal  Communication   Communication: Expressive difficulties  Cognition Arousal/Alertness: Awake/alert Behavior During Therapy: WFL for tasks assessed/performed Overall Cognitive Status: Difficult to assess                                        General Comments      Exercises     Assessment/Plan    PT Assessment Patient needs continued PT services  PT Problem List Decreased strength;Decreased activity tolerance;Decreased balance;Decreased mobility;Decreased knowledge of use of DME;Decreased safety awareness;Decreased knowledge of precautions       PT Treatment Interventions DME instruction;Gait training;Stair training;Functional mobility training;Therapeutic activities;Therapeutic exercise;Neuromuscular re-education;Patient/family education    PT Goals (Current goals can be found in the Care Plan section)  Acute Rehab PT Goals Patient Stated Goal: Pt didn't state goals however obviously frustrated with aphasia and attempting to get words out PT Goal Formulation: With patient Time For Goal Achievement: 05/12/18 Potential to Achieve Goals: Good    Frequency Min 4X/week   Barriers to discharge        Co-evaluation               AM-PAC PT "6 Clicks" Mobility  Outcome Measure Help needed turning from your back to your side while in a flat bed without using bedrails?: None Help needed moving from lying on your back to sitting on the side of a flat bed without using bedrails?: None Help needed moving to and from a bed to a chair (including a wheelchair)?: None Help needed standing up from a chair using your arms (e.g., wheelchair or bedside chair)?: None Help needed to walk in hospital room?: A Little Help needed climbing 3-5 steps with a railing? : A  Little 6 Click Score: 22    End of Session Equipment Utilized During Treatment: Gait belt Activity Tolerance: Patient tolerated treatment well Patient left: in chair;with chair alarm set(No call bell in room - RN notified) Nurse Communication: Mobility status PT Visit Diagnosis: Unsteadiness on feet (R26.81);Other symptoms and signs involving the nervous system (J69.678)    Time: 9381-0175 PT Time Calculation (min) (ACUTE ONLY): 36 min   Charges:   PT Evaluation $PT Eval Moderate Complexity: 1 Mod PT Treatments $Gait Training: 8-22 mins        Rolinda Roan, PT, DPT Acute Rehabilitation Services Pager: [phone] Office: [phone]   Thelma Comp 05/05/2018, 9:46 AM

## 2018-05-05 NOTE — Progress Notes (Signed)
STROKE TEAM PROGRESS NOTE   HISTORY OF PRESENT ILLNESS (per record) [name] is an 73 y.o. female who presented to the [facility] ED via EMS with new onset aphasia. LKN was 4 PM yesterday when family had spoken with her. When family checked on her today, she was aphasic and confused. BP on EMS arrival was 208/114, with HR 98, O2 sat 98% and CBG 94. She was unable to ambulate on scene. After arrival to the ED, it was noted that she could follow commands and write her name with her right hand with some difficulty (large letters with decreased legibility) but had a dense expressive aphasia. Home meds include ASA and low-dose atorvastatin.   CMP was unremarkable. WBC 13.6. Coags normal.   EKG:  Sinus rhythm Probable left atrial enlargement Anterior infarct, old  CT Head: 1. 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 No acute intracranial abnormality. 2. Atrophy with chronic small vessel white matter ischemic disease.  LSN: 4 PM on Thursday tPA Given: No: Out of time window   SUBJECTIVE (INTERVAL HISTORY) I have personally reviewed HPI with patient.  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  She states her aphasia and right-sided weakness is improving.    OBJECTIVE Vitals:   05/05/18 0430 05/05/18 0601 05/05/18 1010 05/05/18 1155  BP:  (!) 176/75 (!) 168/75 (!) 208/94  Pulse: 77 89 81 90  Resp: 13 16 16    Temp:  98.4 F (36.9 C) 98 F (36.7 C)   TempSrc:  Oral Oral   SpO2:  95% 96% 95%  Weight:      Height:        CBC:  Recent Labs  Lab 05/04/18 1919 05/05/18 0224  WBC 13.6* 11.5*  NEUTROABS 10.8*  --   HGB 14.1 13.1  HCT 43.7 40.5  MCV 94.8 91.6  PLT 214 878    Basic Metabolic Panel:  Recent Labs  Lab 05/04/18 1919 05/04/18 1928 05/05/18 0224  NA 138  --  138  K 3.9  --  4.0  CL 104  --  105  CO2 22  --  22  GLUCOSE 108*  --  124*  BUN 17  --  13  CREATININE 0.99 0.80 0.90  CALCIUM 9.2  --  9.1    Lipid Panel:     Component Value  Date/Time   CHOL 158 05/05/2018 0224   TRIG 38 05/05/2018 0224   HDL 64 05/05/2018 0224   CHOLHDL 2.5 05/05/2018 0224   VLDL 8 05/05/2018 0224   LDLCALC 86 05/05/2018 0224   HgbA1c:  Lab Results  Component Value Date   HGBA1C 6.0 (H) 05/04/2018   Urine Drug Screen: No results found for: LABOPIA, COCAINSCRNUR, LABBENZ, AMPHETMU, THCU, LABBARB  Alcohol Level No results found for: ETH  IMAGING  Ct Angio Head W Or Wo Contrast Ct Angio Neck W Or Wo Contrast Ct Cerebral Perfusion W Contrast 05/04/2018 IMPRESSION:  1. Proximal left M2 occlusion with distal reconstitution.  2. Left MCA ischemia with penumbra as detailed above. ASPECTS of 9 upon reassessment of earlier noncontrast CT.  3. 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  4. Chronic 50% proximal left subclavian and mild-to-moderate proximal left vertebral artery stenoses.  5. No significant carotid artery stenosis.  6. Aortic Atherosclerosis (ICD10-I70.0) and Emphysema (ICD10-J43.9).    Ct Head Wo Contrast 05/04/2018 IMPRESSION:  1. 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 No acute intracranial abnormality.  2. Atrophy with chronic small vessel white matter  ischemic disease.    Mr Brain Wo Contrast 05/05/2018 IMPRESSION:  1. Acute/early subacute infarction of the left insula and perisylvian inferior frontal gyrus spanning  2.4 cm. No associated hemorrhage or mass effect. The infarction appears grossly stable in distribution in comparison with the visible infarct on prior CT and CTA of head given differences in technique.  2. Stable chronic right PCA infarction.  3. Mild chronic microvascular ischemic changes and volume loss of the brain.    Transthoracic Echocardiogram  05/05/2018 IMPRESSIONS  1. The left ventricle has low normal systolic function, with an ejection fraction of 50-55%. The cavity size was normal. Left ventricular diastolic Doppler parameters are consistent with impaired relaxation. No evidence  of left ventricular regional wall  motion abnormalities.  2. The right ventricle has normal systolic function. The cavity was normal. There is no increase in right ventricular wall thickness.   EKG - SR rate 95 BPM. (See cardiology reading for complete details)    PHYSICAL EXAM Blood pressure (!) 208/94, pulse 90, temperature 98 F (36.7 C), temperature source Oral, resp. rate 16, height 5\' 5"  (1.651 m), weight 50 kg, SpO2 95 %.  Pleasant frail elderly lady not in distress.  . Afebrile. Head is nontraumatic. Neck is supple without bruit.    Cardiac exam no murmur or gallop. Lungs are clear to auscultation. Distal pulses are well felt.  Neurological Exam :  Awake alert oriented x3.  Mild expressive aphasia with  hesitant speech with word finding difficulties.  Difficulty with naming but good comprehension and repetition.  Extraocular movements are full range without nystagmus.  Dense left homonymous hemianopsia.  Fundi not visualized.  Mild right lower facial asymmetry.  Tongue midline.  Motor system exam no upper or lower extremity drift but mild weakness of right grip and intrinsic hand muscles.  Mild right hip flexor weakness.  Fine finger movements are diminished on the right.  Orbits left over right upper extremity.  Sensation appears preserved bilaterally.  Deep tendon reflexes symmetric.  Plantars downgoing.  Gait not tested.   ASSESSMENT/PLAN [name] is a 74 y.o. female with history of Htn, OSA, ASPVD, loop recorder implant, cancer and previous stroke presenting aphasic and confused . She did not receive IV t-PA due to late presentation.  Stroke:  left insula and perisylvian inferior frontal gyrus infarct - embolic - source unknown.  Resultant mild expressive aphasia and right hemiparesis  CT head  - Old right PCA territory infarct.  MRI head - Acute/early subacute infarction of the left insula and perisylvian inferior frontal gyrus spanning  2.4 cm. Stable chronic  right PCA infarction.   MRA head - not performed.  CTA H&N -   Carotid Doppler - CTA neck performed - carotid dopplers not indicated.  2D Echo -  - EF 50 - 55%. No cardiac source of emboli identified.   LDL - 86  HgbA1c - 6  UDS - not performed  VTE prophylaxis - Lovenox  Diet  - Heart healthy with thin liquids.  aspirin 81 mg daily prior to admission, now on aspirin 325 mg daily and clopidogrel 75 mg daily  Patient counseled to be compliant with her antithrombotic medications  Ongoing aggressive stroke risk factor management  Therapy recommendations:  HH PT recommended  Disposition:  Pending  Hypertension  Blood pressure somewhat high at times but within post stroke parameters  Permissive hypertension (OK if < 220/120) but gradually normalize in 5-7 days . Long-term BP goal normotensive  Hyperlipidemia  Lipid lowering medication PTA:  Lipitor 10 mg daily  LDL 86, goal < 70  Current lipid lowering medication: Lipitor 40 mg daily  Continue statin at discharge   Other Stroke Risk Factors  Advanced age  Former cigarette smoker - quit 23 months ago ?  Hx stroke/TIA  Family hx stroke (father)  Obstructive sleep apnea  Other Active Problems  Mild leukocytosis - 13.6->11.5 (afebrile)  Blood pressure somewhat high at times but within post stroke parameters    PLAN  Pt needs loop interrogation ([employer] device placed 03/21/2016 Dr Curt Bears) Apparently checked earlier this month.  ASA 81 mg daily and Plavix 75 mg daily recommended for 3 weeks then Plavix alone (was on ASA 81 mg daily PTA)  If atrial fibrillation is documented will need anticoagulation.    Hospital day # 0  I have personally obtained history,examined this patient, reviewed notes, independently viewed imaging studies, participated in medical decision making and plan of care.ROS completed by me personally and pertinent positives fully documented  I have made any additions or  clarifications directly to the above note.  Patient has prior history of cryptogenic stroke and has a loop recorder.  She presented with another embolic stroke involving left MCA branch with mild aphasia and right hemiparesis.  Recommend loop recorder interrogation for paroxysmal A. fib.  Dual antiplatelet therapy of aspirin and Plavix for 3 weeks followed by Plavix alone.  Aggressive risk factor modification.  Long discussion with the patient and answered questions.  Greater than 50% time during this 35-minute visit was spent on counseling and coordination of care about her embolic cryptogenic stroke and discussion about evaluation and treatment plan and answering questions.  Discussed with Dr. Tamala Julian.  Antony Contras, MD Medical Director [facility] Pager: [phone] 05/05/2018 2:13 PM   To contact Stroke Continuity provider, please refer to [email or web address] After hours, contact General Neurology

## 2018-05-05 NOTE — Progress Notes (Signed)
  Echocardiogram 2D Echocardiogram has been performed.  [name] 05/05/2018, 10:05 AM

## 2018-05-06 MED ORDER — ASPIRIN EC 81 MG PO TBEC
81.0000 mg | DELAYED_RELEASE_TABLET | Freq: Every day | ORAL | Status: DC
  Administered 2018-05-06 – 2018-05-09 (×4): 81 mg via ORAL
  Filled 2018-05-06 (×4): qty 1

## 2018-05-06 NOTE — Progress Notes (Signed)
Episode of about 120 cc food substance emesis. Vomiting was likely precipitated by swallow test. Provider notified with order for IV  Zofran mg q 6 hrs received. Zofran given with relief.

## 2018-05-06 NOTE — Progress Notes (Signed)
Progress Note    [name]  PIR:[id number] DOB: [birth year]  DOA: 05/04/2018 PCP: Shon Baton, MD    Brief Narrative:   Chief complaint: Difficulty speaking  Medical records reviewed and are as summarized below:  [name] is an 73 y.o. female with past history significant for right PCA stroke, HTN, HLD, asthma, depression, SCC of tongue s/p excision, and tobacco use; who presents with expressive aphasia.  Assessment/Plan:   Principal Problem:   Expressive aphasia Active Problems:   History of stroke   Asthma   Hypertension   Depression   Tobacco use   Stroke [facility])  Expressive aphasia secondary to acute/subacute CVA, history of right PCA stroke: Patient presents with inability to get express what she wants to say. CTA shows left M2 superior division occlusion and perfusion deficit in left frontal operculum MRI revealing left insula and perisylvian inferior frontal gyrus strokes.  LDL not at goal.  Hemoglobin A1c 6.  PT/OT/speech recommending home health services at discharge.  Echocardiogram noted EF of 50 to 55% with impaired relaxation.  A mild decrease in function from previous echocardiogram in 2018 where EF was noted to be 55 to 60%..  Patient previously had been on aspirin alone after previous stroke right PCA stroke in March 2018.  At that time she also had a loop recorder placed.  Loop recorder last interrogated on 4/12 by Dr. Curt Bears with normal battery and no arrhythmias noted.  Patient and family concerned that she will be unable to care for self at home and now requesting rehab. -Continue PT/OT/speech -Continue statin -Aspirin and Plavix for 3 weeks, then continue on Plavix alone  -Appreciate neurology consultative services, will follow up with any further recommendation -Will need outpatient follow-up with neurology -Social work consulted for skilled nursing rehab  Essential hypertension: Blood pressures elevated up to 208/94.  Antihypertensives  of amlodipine were initially held. -Continue amlodipine -Hydralazine IV as needed   Hyperlipidemia: Total cholesterol 158, HDL 64, LDL 86, triglycerides 38.  Goal LDL less than 70.  Patient had been on atorvastatin 10 mg daily at home. -Continue atorvastatin 40 mg daily  Leukocytosis: Acute.  WBC elevated at 13.6 on admission.  Appears to be trending down and was last noted to be 11.5.  No signs of fever overnight. -Continue to monitor  History of asthma/suspected COPD: Patient currently stable without any wheezes appreciated. -Continue pharmacy substitution for Symbicort  Prediabetes: Hemoglobin A1c 6.  Patient not on any diabetic medications at home -Continue outpatient monitoring.  Depression: On physical exam patient appears to be somewhat pressed. -Continue Lexapro  Tobacco use: Patient reported quitting smoking approximately 2 years ago. -Counseled on the need of continued cessation of tobacco use  Body mass index is 18.34 kg/m.   Family Communication/Anticipated D/C date and plan/Code Status   DVT prophylaxis: Lovenox ordered. Code Status: Full Code.  Family Communication: No family present at bedside Disposition Plan: likely discharge to rehab in 1 to 2 days pending authorization and approval.   Medical Consultants:    Neurology   Anti-Infectives:    None  Subjective:   In talk to the patient she lives alone and only had friends coming into check on her intermittently.  Patient and family do not feel comfortable with her going home at this time and would like her to get some rehab before coming home.  Objective:    Vitals:   05/06/18 0408 05/06/18 0807 05/06/18 0817 05/06/18 1103  BP: 134/62   [name])  143/63  Pulse: 90     Resp: (!) 27   17  Temp: 97.6 F (36.4 C) 98 F (36.7 C)    TempSrc: Oral Oral    SpO2: 97%  96% 97%  Weight:      Height:        Intake/Output Summary (Last 24 hours) at 05/06/2018 1411 Last data filed at 05/05/2018 1855 Gross  per 24 hour  Intake 240 ml  Output --  Net 240 ml   Filed Weights   05/04/18 1933 05/04/18 2221  Weight: 53.5 kg 50 kg    Exam: Constitutional: Elderly female in NAD, calm, comfortable.  Able to follow commands. Eyes: PERRL, lids and conjunctivae normal ENMT: Mucous membranes are moist. Posterior pharynx clear of any exudate or lesions. Neck: normal, supple, no masses, no thyromegaly Respiratory: Decreased aeration noted but no significant wheezes or rhonchi appreciated.. Normal respiratory effort. No accessory muscle use.  Cardiovascular: Regular rate and rhythm, no murmurs / rubs / gallops. No extremity edema. 2+ pedal pulses. No carotid bruits.  Abdomen: no tenderness, no masses palpated. No hepatosplenomegaly. Bowel sounds positive.  Musculoskeletal: no clubbing / cyanosis. No joint deformity upper and lower extremities. Good ROM, no contractures. Normal muscle tone.  Skin: no rashes, lesions, ulcers. No induration Neurologic: CN 2-12 grossly intact.  Expressive aphasia.  Strength 4+/5 on right upper /lower extremity, and strength 5/5 on the left.  Apraxia noted as well when asked to write. Psychiatric: Normal judgment and insight. Alert and oriented x 3.  Depressed mood.    Data Reviewed:   I have personally reviewed following labs and imaging studies:  Labs: Labs show the following:   Basic Metabolic Panel: Recent Labs  Lab 05/04/18 1919 05/04/18 1928 05/05/18 0224  NA 138  --  138  K 3.9  --  4.0  CL 104  --  105  CO2 22  --  22  GLUCOSE 108*  --  124*  BUN 17  --  13  CREATININE 0.99 0.80 0.90  CALCIUM 9.2  --  9.1   GFR Estimated Creatinine Clearance: 43.9 mL/min (by C-G formula based on SCr of 0.9 mg/dL). Liver Function Tests: Recent Labs  Lab 05/04/18 1919  AST 23  ALT 14  ALKPHOS 72  BILITOT 0.6  PROT 6.9  ALBUMIN 4.2   No results for input(s): LIPASE, AMYLASE in the last 168 hours. No results for input(s): AMMONIA in the last 168  hours. Coagulation profile Recent Labs  Lab 05/04/18 1919  INR 1.0    CBC: Recent Labs  Lab 05/04/18 1919 05/05/18 0224  WBC 13.6* 11.5*  NEUTROABS 10.8*  --   HGB 14.1 13.1  HCT 43.7 40.5  MCV 94.8 91.6  PLT 214 213   Cardiac Enzymes: No results for input(s): CKTOTAL, CKMB, CKMBINDEX, TROPONINI in the last 168 hours. BNP (last 3 results) No results for input(s): PROBNP in the last 8760 hours. CBG: Recent Labs  Lab 05/04/18 1926  GLUCAP 122*   D-Dimer: No results for input(s): DDIMER in the last 72 hours. Hgb A1c: Recent Labs    05/04/18 2126  HGBA1C 6.0*   Lipid Profile: Recent Labs    05/05/18 0224  CHOL 158  HDL 64  LDLCALC 86  TRIG 38  CHOLHDL 2.5   Thyroid function studies: No results for input(s): TSH, T4TOTAL, T3FREE, THYROIDAB in the last 72 hours.  Invalid input(s): FREET3 Anemia work up: No results for input(s): VITAMINB12, FOLATE, FERRITIN, TIBC, IRON, RETICCTPCT in  the last 72 hours. Sepsis Labs: Recent Labs  Lab 05/04/18 1919 05/05/18 0224  WBC 13.6* 11.5*    Microbiology No results found for this or any previous visit (from the past 240 hour(s)).  Procedures and diagnostic studies:  Echocardiogram on 05/05/2018: Impression 1. The left ventricle has low normal systolic function, with an ejection fraction of 50-55%. The cavity size was normal. Left ventricular diastolic Doppler parameters are consistent with impaired relaxation. No evidence of left ventricular regional wall  motion abnormalities.  2. The right ventricle has normal systolic function. The cavity was normal. There is no increase in right ventricular wall thickness.  Ct Angio Head W Or Wo Contrast  Result Date: 05/04/2018 CLINICAL DATA:  Aphasia.  Right arm weakness. EXAM: CT ANGIOGRAPHY HEAD AND NECK CT PERFUSION BRAIN TECHNIQUE: Multidetector CT imaging of the head and neck was performed using the standard protocol during bolus administration of intravenous contrast.  Multiplanar CT image reconstructions and MIPs were obtained to evaluate the vascular anatomy. Carotid stenosis measurements (when applicable) are obtained utilizing NASCET criteria, using the distal internal carotid diameter as the denominator. Multiphase CT imaging of the brain was performed following IV bolus contrast injection. Subsequent parametric perfusion maps were calculated using RAPID software. CONTRAST:  13mL OMNIPAQUE IOHEXOL 350 MG/ML SOLN COMPARISON:  Head and neck MRA 03/18/2016 FINDINGS: CTA NECK FINDINGS Aortic arch: Standard 3 vessel aortic arch with moderate atherosclerotic plaque. Prominent soft plaque in the proximal left subclavian artery results in just under 50% stenosis. Right carotid system: Patent with calcified plaque at the carotid bifurcation and in the distal cervical ICA without evidence of significant stenosis or dissection. Left carotid system: Patent with moderate volume calcified plaque at the carotid bifurcation without evidence of significant stenosis or dissection. Vertebral arteries: Chronic right vertebral artery occlusion near its origin with only minimal intermittent reconstitution in the neck. Patent left vertebral artery with chronic mild-to-moderate V1 segment stenosis. Skeleton: Mild cervical spondylosis. Moderate cervical facet arthrosis. Other neck: No mass or enlarged lymph nodes. Upper chest: Biapical pleuroparenchymal lung scarring. Mild centrilobular emphysema. Review of the MIP images confirms the above findings CTA HEAD FINDINGS Anterior circulation: The internal carotid arteries are patent from skull base to carotid termini with mild-to-moderate plaque bilaterally not resulting in significant stenosis. ACAs and MCAs are patent without significant A1 or M1 stenosis. There is new occlusion of a proximal left M2 branch vessel with distal reconstitution. No aneurysm is identified. Posterior circulation: The intracranial left vertebral artery is widely patent and  supplies the basilar. There is retrograde opacification of the right vertebral artery with patent PICAs bilaterally. The basilar artery is widely patent. There is a robust left posterior communicating artery. The left PCA is patent with mild branch vessel irregularity. The right PCA is occluded at the distal P1 level, more proximally than on the 2018 MRA where there was occlusion beginning at the mid P2 level. No aneurysm is identified. Venous sinuses: Patent. Anatomic variants: None. Review of the MIP images confirms the above findings CT Brain Perfusion Findings: ASPECTS: 9 upon reassessment of earlier noncontrast CT with small region of acute infarct in the left insula CBF (<30%) Volume: 68mL Perfusion (Tmax>6.0s) volume: 64mL Mismatch Volume: 66mL, though with caveat that automated analysis did not detect the early small infarct apparent in the left insula on noncontrast CT Infarction Location: No core infarct. Delayed perfusion/penumbra in the left MCA territory involving the posterior frontal and anterior parietal lobes including in the operculum. IMPRESSION: 1. Proximal left M2  occlusion with distal reconstitution. 2. Left MCA ischemia with penumbra as detailed above. ASPECTS of 9 upon reassessment of earlier noncontrast CT. 3. 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 4. Chronic 50% proximal left subclavian and mild-to-moderate proximal left vertebral artery stenoses. 5. No significant carotid artery stenosis. 6. Aortic Atherosclerosis (ICD10-I70.0) and Emphysema (ICD10-J43.9). These results were called by telephone at the time of interpretation on 05/04/2018 at 9:50 pm to Dr. Kerney Elbe , who verbally acknowledged these results. Electronically Signed   By: Logan Bores M.D.   On: 05/04/2018 22:20   Ct Head Wo Contrast  Result Date: 05/04/2018 CLINICAL DATA:  Altered level of consciousness. EXAM: CT HEAD WITHOUT CONTRAST TECHNIQUE: Contiguous axial images were obtained from the base  of the skull through the vertex without intravenous contrast. COMPARISON:  03/18/2016 FINDINGS: Brain: Old right PCA territory infarct noted. There is no evidence for acute hemorrhage, hydrocephalus, mass lesion, or abnormal extra-axial fluid collection. No definite CT evidence for acute infarction. Diffuse loss of parenchymal volume is consistent with atrophy. Patchy low attenuation in the deep hemispheric and periventricular white matter is nonspecific, but likely reflects chronic microvascular ischemic demyelination. Vascular: No hyperdense vessel or unexpected calcification. Skull: No evidence for fracture. No worrisome lytic or sclerotic lesion. Sinuses/Orbits: The visualized paranasal sinuses and mastoid air cells are clear. Visualized portions of the globes and intraorbital fat are unremarkable. Other: None. IMPRESSION: 1. 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 No acute intracranial abnormality. 2. Atrophy with chronic small vessel white matter ischemic disease. Electronically Signed   By: Misty Stanley M.D.   On: 05/04/2018 19:41   Ct Angio Neck W Or Wo Contrast  Result Date: 05/04/2018 CLINICAL DATA:  Aphasia.  Right arm weakness. EXAM: CT ANGIOGRAPHY HEAD AND NECK CT PERFUSION BRAIN TECHNIQUE: Multidetector CT imaging of the head and neck was performed using the standard protocol during bolus administration of intravenous contrast. Multiplanar CT image reconstructions and MIPs were obtained to evaluate the vascular anatomy. Carotid stenosis measurements (when applicable) are obtained utilizing NASCET criteria, using the distal internal carotid diameter as the denominator. Multiphase CT imaging of the brain was performed following IV bolus contrast injection. Subsequent parametric perfusion maps were calculated using RAPID software. CONTRAST:  164mL OMNIPAQUE IOHEXOL 350 MG/ML SOLN COMPARISON:  Head and neck MRA 03/18/2016 FINDINGS: CTA NECK FINDINGS Aortic arch: Standard 3 vessel aortic arch with moderate  atherosclerotic plaque. Prominent soft plaque in the proximal left subclavian artery results in just under 50% stenosis. Right carotid system: Patent with calcified plaque at the carotid bifurcation and in the distal cervical ICA without evidence of significant stenosis or dissection. Left carotid system: Patent with moderate volume calcified plaque at the carotid bifurcation without evidence of significant stenosis or dissection. Vertebral arteries: Chronic right vertebral artery occlusion near its origin with only minimal intermittent reconstitution in the neck. Patent left vertebral artery with chronic mild-to-moderate V1 segment stenosis. Skeleton: Mild cervical spondylosis. Moderate cervical facet arthrosis. Other neck: No mass or enlarged lymph nodes. Upper chest: Biapical pleuroparenchymal lung scarring. Mild centrilobular emphysema. Review of the MIP images confirms the above findings CTA HEAD FINDINGS Anterior circulation: The internal carotid arteries are patent from skull base to carotid termini with mild-to-moderate plaque bilaterally not resulting in significant stenosis. ACAs and MCAs are patent without significant A1 or M1 stenosis. There is new occlusion of a proximal left M2 branch vessel with distal reconstitution. No aneurysm is identified. Posterior circulation: The intracranial left vertebral artery is widely patent  and supplies the basilar. There is retrograde opacification of the right vertebral artery with patent PICAs bilaterally. The basilar artery is widely patent. There is a robust left posterior communicating artery. The left PCA is patent with mild branch vessel irregularity. The right PCA is occluded at the distal P1 level, more proximally than on the 2018 MRA where there was occlusion beginning at the mid P2 level. No aneurysm is identified. Venous sinuses: Patent. Anatomic variants: None. Review of the MIP images confirms the above findings CT Brain Perfusion Findings: ASPECTS: 9  upon reassessment of earlier noncontrast CT with small region of acute infarct in the left insula CBF (<30%) Volume: 86mL Perfusion (Tmax>6.0s) volume: 37mL Mismatch Volume: 24mL, though with caveat that automated analysis did not detect the early small infarct apparent in the left insula on noncontrast CT Infarction Location: No core infarct. Delayed perfusion/penumbra in the left MCA territory involving the posterior frontal and anterior parietal lobes including in the operculum. IMPRESSION: 1. Proximal left M2 occlusion with distal reconstitution. 2. Left MCA ischemia with penumbra as detailed above. ASPECTS of 9 upon reassessment of earlier noncontrast CT. 3. 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 4. Chronic 50% proximal left subclavian and mild-to-moderate proximal left vertebral artery stenoses. 5. No significant carotid artery stenosis. 6. Aortic Atherosclerosis (ICD10-I70.0) and Emphysema (ICD10-J43.9). These results were called by telephone at the time of interpretation on 05/04/2018 at 9:50 pm to Dr. Kerney Elbe , who verbally acknowledged these results. Electronically Signed   By: Logan Bores M.D.   On: 05/04/2018 22:20   Mr Brain Wo Contrast  Result Date: 05/05/2018 CLINICAL DATA:  73 y/o F; expressive aphasia. Stroke for follow-up. EXAM: MRI HEAD WITHOUT CONTRAST TECHNIQUE: Multiplanar, multiecho pulse sequences of the brain and surrounding structures were obtained without intravenous contrast. COMPARISON:  05/04/2018 CT head, CTA, CT perfusion head FINDINGS: Brain: Area of cortical reduced diffusion spanning 2.4 cm centered within the left insula and perisylvian inferior frontal gyrus compatible with acute/early subacute infarction. Stable chronic right PCA infarction. Few punctate nonspecific T2 FLAIR hyperintensities in subcortical and periventricular white matter are compatible with mild chronic microvascular ischemic changes. Mild volume loss of the brain. No extra-axial  collection, hydrocephalus, mass effect, or herniation. There is mild hemosiderin staining in the right occipital cortex within the region of chronic infarction. No additional abnormal susceptibility hypointensity to indicate intracranial hemorrhage. Vascular: Normal flow voids. Skull and upper cervical spine: Normal marrow signal. Sinuses/Orbits: Partial opacification of the left mastoid air cells. No abnormal signal of the occluded paranasal sinuses or the right mastoid air cells. Bilateral intra-ocular lens replacement. Other: None. IMPRESSION: 1. Acute/early subacute infarction of the left insula and perisylvian inferior frontal gyrus spanning 2.4 cm. No associated hemorrhage or mass effect. The infarction appears grossly stable in distribution in comparison with the visible infarct on prior CT and CTA of head given differences in technique. 2. Stable chronic right PCA infarction. 3. Mild chronic microvascular ischemic changes and volume loss of the brain. Electronically Signed   By: Kristine Garbe M.D.   On: 05/05/2018 03:35   Ct Cerebral Perfusion W Contrast  Result Date: 05/04/2018 CLINICAL DATA:  Aphasia.  Right arm weakness. EXAM: CT ANGIOGRAPHY HEAD AND NECK CT PERFUSION BRAIN TECHNIQUE: Multidetector CT imaging of the head and neck was performed using the standard protocol during bolus administration of intravenous contrast. Multiplanar CT image reconstructions and MIPs were obtained to evaluate the vascular anatomy. Carotid stenosis measurements (when applicable) are obtained  utilizing NASCET criteria, using the distal internal carotid diameter as the denominator. Multiphase CT imaging of the brain was performed following IV bolus contrast injection. Subsequent parametric perfusion maps were calculated using RAPID software. CONTRAST:  154mL OMNIPAQUE IOHEXOL 350 MG/ML SOLN COMPARISON:  Head and neck MRA 03/18/2016 FINDINGS: CTA NECK FINDINGS Aortic arch: Standard 3 vessel aortic arch with  moderate atherosclerotic plaque. Prominent soft plaque in the proximal left subclavian artery results in just under 50% stenosis. Right carotid system: Patent with calcified plaque at the carotid bifurcation and in the distal cervical ICA without evidence of significant stenosis or dissection. Left carotid system: Patent with moderate volume calcified plaque at the carotid bifurcation without evidence of significant stenosis or dissection. Vertebral arteries: Chronic right vertebral artery occlusion near its origin with only minimal intermittent reconstitution in the neck. Patent left vertebral artery with chronic mild-to-moderate V1 segment stenosis. Skeleton: Mild cervical spondylosis. Moderate cervical facet arthrosis. Other neck: No mass or enlarged lymph nodes. Upper chest: Biapical pleuroparenchymal lung scarring. Mild centrilobular emphysema. Review of the MIP images confirms the above findings CTA HEAD FINDINGS Anterior circulation: The internal carotid arteries are patent from skull base to carotid termini with mild-to-moderate plaque bilaterally not resulting in significant stenosis. ACAs and MCAs are patent without significant A1 or M1 stenosis. There is new occlusion of a proximal left M2 branch vessel with distal reconstitution. No aneurysm is identified. Posterior circulation: The intracranial left vertebral artery is widely patent and supplies the basilar. There is retrograde opacification of the right vertebral artery with patent PICAs bilaterally. The basilar artery is widely patent. There is a robust left posterior communicating artery. The left PCA is patent with mild branch vessel irregularity. The right PCA is occluded at the distal P1 level, more proximally than on the 2018 MRA where there was occlusion beginning at the mid P2 level. No aneurysm is identified. Venous sinuses: Patent. Anatomic variants: None. Review of the MIP images confirms the above findings CT Brain Perfusion Findings:  ASPECTS: 9 upon reassessment of earlier noncontrast CT with small region of acute infarct in the left insula CBF (<30%) Volume: 4mL Perfusion (Tmax>6.0s) volume: 53mL Mismatch Volume: 65mL, though with caveat that automated analysis did not detect the early small infarct apparent in the left insula on noncontrast CT Infarction Location: No core infarct. Delayed perfusion/penumbra in the left MCA territory involving the posterior frontal and anterior parietal lobes including in the operculum. IMPRESSION: 1. Proximal left M2 occlusion with distal reconstitution. 2. Left MCA ischemia with penumbra as detailed above. ASPECTS of 9 upon reassessment of earlier noncontrast CT. 3. 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 4. Chronic 50% proximal left subclavian and mild-to-moderate proximal left vertebral artery stenoses. 5. No significant carotid artery stenosis. 6. Aortic Atherosclerosis (ICD10-I70.0) and Emphysema (ICD10-J43.9). These results were called by telephone at the time of interpretation on 05/04/2018 at 9:50 pm to Dr. Kerney Elbe , who verbally acknowledged these results. Electronically Signed   By: Logan Bores M.D.   On: 05/04/2018 22:20    Medications:     stroke: mapping our early stages of recovery book   Does not apply Once   amLODipine  10 mg Oral Daily   aspirin EC  81 mg Oral Daily   atorvastatin  40 mg Oral Daily   clopidogrel  75 mg Oral Daily   enoxaparin (LOVENOX) injection  40 mg Subcutaneous Q24H   escitalopram  20 mg Oral Daily   mometasone-formoterol  2 puff  Inhalation BID   nicotine  21 mg Transdermal Q2000   Continuous Infusions:   LOS: 1 day   [place] A Rusty Glodowski  [facility]   *Please refer to [email or web address], password [email or web address] to get updated schedule on who will round on this patient, as hospitalists switch teams weekly. If 7PM-7AM, please contact night-coverage at [email or web address], password [email or web address] for any overnight  needs.

## 2018-05-06 NOTE — Progress Notes (Signed)
Physical Therapy Treatment Patient Details Name: [name] MRN: [id number] DOB: Jan 24, 1945 Today's Date: 05/06/2018    History of Present Illness Pt is a 73 y/o female who presents to the ED after family found her aphasic and confused. 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 PMH significant for prior CVA, PVD, HTN, CA, asthma.    PT Comments    Pt doing well with mobility. Should be able to mobilize without assist in the home. Agree with OT recommendation that pt have some assistance at home (see OT note of 4/18) or possibly look at ALF.   Follow Up Recommendations  Home health PT;Supervision - Intermittent     Equipment Recommendations  None recommended by PT    Recommendations for Other Services       Precautions / Restrictions Precautions Precautions: Other (comment) Precaution Comments: Aphasic Restrictions Weight Bearing Restrictions: No    Mobility  Bed Mobility Overal bed mobility: Modified Independent             General bed mobility comments: HOB raised  Transfers Overall transfer level: Needs assistance Equipment used: None Transfers: Sit to/from Stand Sit to Stand: Modified independent (Device/Increase time)            Ambulation/Gait Ambulation/Gait assistance: Supervision Gait Distance (Feet): 300 Feet Assistive device: None Gait Pattern/deviations: Step-through pattern;Decreased stride length;Narrow base of support Gait velocity: Decreased Gait velocity interpretation: 1.31 - 2.62 ft/sec, indicative of limited community ambulator General Gait Details: supervision for safety. Gait tentative but no loss of balance   Stairs             Wheelchair Mobility    Modified Rankin (Stroke Patients Only) Modified Rankin (Stroke Patients Only) Pre-Morbid Rankin Score: No significant disability Modified Rankin: Moderately severe disability     Balance Overall balance  assessment: Needs assistance Sitting-balance support: Feet supported;No upper extremity supported Sitting balance-Leahy Scale: Good     Standing balance support: No upper extremity supported;During functional activity Standing balance-Leahy Scale: Fair                              Cognition Arousal/Alertness: Awake/alert Behavior During Therapy: WFL for tasks assessed/performed Overall Cognitive Status: Difficult to assess                                        Exercises      General Comments        Pertinent Vitals/Pain Pain Assessment: Faces Faces Pain Scale: Hurts even more Pain Location: epigastric Pain Descriptors / Indicators: Grimacing;Sharp Pain Intervention(s): Other (comment)(nursing notified)    Home Living                      Prior Function            PT Goals (current goals can now be found in the care plan section) Progress towards PT goals: Progressing toward goals;Goals met and updated - see care plan    Frequency    Min 3X/week      PT Plan Current plan remains appropriate;Frequency needs to be updated    Co-evaluation              AM-PAC PT "6 Clicks" Mobility   Outcome Measure  Help needed turning from your back to your side while in a  flat bed without using bedrails?: None Help needed moving from lying on your back to sitting on the side of a flat bed without using bedrails?: None Help needed moving to and from a bed to a chair (including a wheelchair)?: None Help needed standing up from a chair using your arms (e.g., wheelchair or bedside chair)?: None Help needed to walk in hospital room?: A Little Help needed climbing 3-5 steps with a railing? : A Little 6 Click Score: 22    End of Session   Activity Tolerance: Patient tolerated treatment well Patient left: in bed;with bed alarm set;with call bell/phone within reach(No call bell in room - RN notified) Nurse Communication: Mobility  status;Other (comment)(pain) PT Visit Diagnosis: Unsteadiness on feet (R26.81);Other symptoms and signs involving the nervous system (R29.898)     Time: 7670-1100 PT Time Calculation (min) (ACUTE ONLY): 13 min  Charges:  $Gait Training: 8-22 mins                     [place] Pager [phone] Office [place] 05/06/2018, 1:28 PM

## 2018-05-06 NOTE — Progress Notes (Signed)
Doctor made aware the pt. does not have IV site. States its okay to not have one.

## 2018-05-06 NOTE — Progress Notes (Addendum)
Occupational Therapy Treatment Patient Details Name: [name] MRN: [id number] DOB: 06-19-1945 Today's Date: 05/06/2018    History of present illness Pt is a 73 y/o female who presents to the ED after family found her aphasic and confused. 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 PMH significant for prior CVA, PVD, HTN, CA, asthma.   OT comments  Pt with somewhat improved ability to communicate today.  She now has her cell phone.  Worked on cell phone use.  She requires max A to make a phone call, and max A to navigate her home screens.  Still unsure re: support at discharge.   She will need 24 hour assist at discharge at least initially, and then may be able to reduce this once she Is able to prove that she can perform IADLs or has safe compensatory strategies in place.  She will require assistance with creating safe communication plan - may benefit from a life alert.  She will need to demonstrate ability to use microwave for meal prep when fatigued, as well as direct assist with finances and medication management. Pt is not safe to return home, optimally recommend SNF rehab  And she may need to consider ALF, but she is not safe to be home alone at this time.   Follow Up Recommendations  SNF    Equipment Recommendations  None recommended by OT    Recommendations for Other Services      Precautions / Restrictions Precautions Precaution Comments: Aphasic       Mobility Bed Mobility                  Transfers                      Balance                                           ADL either performed or assessed with clinical judgement   ADL                                               Vision       Perception     Praxis      Cognition Arousal/Alertness: Awake/alert Behavior During Therapy: WFL for tasks assessed/performed Overall Cognitive Status:  Difficult to assess                                          Exercises Exercises: Other exercises Other Exercises Other Exercises: Pt now has cell phone, however, she demonstrates signficant difficulty using it.  She is unable to navigate the screen and unable to identify contacts to make phone calls - required max A.  She also appears apraxic at times.  She seems to indicate she has a landline at home that has a programmed emergency number, but denies that she has a life alert.  She now indicates she does indeed have a caregiver for a couple of hours 4x/week  Long discussion with her re: need for initial 24 hour assist at discharge, and she acknowledged understanding    Shoulder Instructions  General Comments      Pertinent Vitals/ Pain       Pain Assessment: Faces Faces Pain Scale: Hurts a little bit Pain Location: epigastric Pain Descriptors / Indicators: Grimacing Pain Intervention(s): Monitored during session  Home Living                                          Prior Functioning/Environment              Frequency  Min 2X/week        Progress Toward Goals  OT Goals(current goals can now be found in the care plan section)  Progress towards OT goals: Progressing toward goals  ADL Goals Pt Will Perform Grooming: with modified independence;standing Pt Will Perform Upper Body Bathing: with modified independence;standing Pt Will Perform Lower Body Bathing: with modified independence;sit to/from stand Pt Will Perform Upper Body Dressing: with modified independence;sitting Pt Will Perform Lower Body Dressing: with modified independence;sit to/from stand Pt Will Transfer to Toilet: with modified independence;ambulating;regular height toilet Pt Will Perform Toileting - Clothing Manipulation and hygiene: with modified independence;sit to/from stand Additional ADL Goal #1: Pt will be able to make a phone call mod I Additional ADL  Goal #2: Pt will self initiate ADL activiites  Plan Discharge plan remains appropriate    Co-evaluation                 AM-PAC OT "6 Clicks" Daily Activity     Outcome Measure   Help from another person eating meals?: None Help from another person taking care of personal grooming?: A Little Help from another person toileting, which includes using toliet, bedpan, or urinal?: A Little Help from another person bathing (including washing, rinsing, drying)?: A Little Help from another person to put on and taking off regular upper body clothing?: A Little Help from another person to put on and taking off regular lower body clothing?: A Little 6 Click Score: 19    End of Session    OT Visit Diagnosis: Cognitive communication deficit (R41.841) Symptoms and signs involving cognitive functions: Cerebral infarction   Activity Tolerance Patient tolerated treatment well   Patient Left in bed;with call bell/phone within reach;with bed alarm set;with nursing/sitter in room   Nurse Communication Mobility status        Time: 2979-8921 OT Time Calculation (min): 30 min  Charges: OT General Charges $OT Visit: 1 Visit OT Treatments $Therapeutic Activity: 23-37 mins  Lucille Passy, OTR/L Acute Rehabilitation Services Pager [phone] Office [phone]    Lucille Passy M 05/06/2018, 6:47 PM

## 2018-05-06 NOTE — Progress Notes (Signed)
STROKE TEAM PROGRESS NOTE     SUBJECTIVE (INTERVAL HISTORY)    She states her aphasia and right-sided weakness is unchanged MRI scan of the brain confirms left insular cortex infarct with old right PCA infarct.  Transthoracic echo was unremarkable.   OBJECTIVE Vitals:   05/06/18 0408 05/06/18 0807 05/06/18 0817 05/06/18 1103  BP: 134/62   (!) 143/63  Pulse: 90     Resp: (!) 27   17  Temp: 97.6 F (36.4 C) 98 F (36.7 C)    TempSrc: Oral Oral    SpO2: 97%  96% 97%  Weight:      Height:        CBC:  Recent Labs  Lab 05/04/18 1919 05/05/18 0224  WBC 13.6* 11.5*  NEUTROABS 10.8*  --   HGB 14.1 13.1  HCT 43.7 40.5  MCV 94.8 91.6  PLT 214 295    Basic Metabolic Panel:  Recent Labs  Lab 05/04/18 1919 05/04/18 1928 05/05/18 0224  NA 138  --  138  K 3.9  --  4.0  CL 104  --  105  CO2 22  --  22  GLUCOSE 108*  --  124*  BUN 17  --  13  CREATININE 0.99 0.80 0.90  CALCIUM 9.2  --  9.1    Lipid Panel:     Component Value Date/Time   CHOL 158 05/05/2018 0224   TRIG 38 05/05/2018 0224   HDL 64 05/05/2018 0224   CHOLHDL 2.5 05/05/2018 0224   VLDL 8 05/05/2018 0224   LDLCALC 86 05/05/2018 0224   HgbA1c:  Lab Results  Component Value Date   HGBA1C 6.0 (H) 05/04/2018   Urine Drug Screen: No results found for: LABOPIA, COCAINSCRNUR, LABBENZ, AMPHETMU, THCU, LABBARB  Alcohol Level No results found for: ETH  IMAGING  Ct Angio Head W Or Wo Contrast Ct Angio Neck W Or Wo Contrast Ct Cerebral Perfusion W Contrast 05/04/2018 IMPRESSION:  1. Proximal left M2 occlusion with distal reconstitution.  2. Left MCA ischemia with penumbra as detailed above. ASPECTS of 9 upon reassessment of earlier noncontrast CT.  3. 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  4. Chronic 50% proximal left subclavian and mild-to-moderate proximal left vertebral artery stenoses.  5. No significant carotid artery stenosis.  6. Aortic Atherosclerosis (ICD10-I70.0) and  Emphysema (ICD10-J43.9).    Ct Head Wo Contrast 05/04/2018 IMPRESSION:  1. 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 No acute intracranial abnormality.  2. Atrophy with chronic small vessel white matter ischemic disease.    Mr Brain Wo Contrast 05/05/2018 IMPRESSION:  1. Acute/early subacute infarction of the left insula and perisylvian inferior frontal gyrus spanning  2.4 cm. No associated hemorrhage or mass effect. The infarction appears grossly stable in distribution in comparison with the visible infarct on prior CT and CTA of head given differences in technique.  2. Stable chronic right PCA infarction.  3. Mild chronic microvascular ischemic changes and volume loss of the brain.    Transthoracic Echocardiogram  05/05/2018 IMPRESSIONS  1. The left ventricle has low normal systolic function, with an ejection fraction of 50-55%. The cavity size was normal. Left ventricular diastolic Doppler parameters are consistent with impaired relaxation. No evidence of left ventricular regional wall  motion abnormalities.  2. The right ventricle has normal systolic function. The cavity was normal. There is no increase in right ventricular wall thickness.   EKG - SR rate 95 BPM. (See cardiology reading for complete details)  PHYSICAL EXAM Blood pressure (!) 143/63, pulse 90, temperature 98 F (36.7 C), temperature source Oral, resp. rate 17, height 5\' 5"  (1.651 m), weight 50 kg, SpO2 97 %.  Pleasant frail elderly lady not in distress.  . Afebrile. Head is nontraumatic. Neck is supple without bruit.    Cardiac exam no murmur or gallop. Lungs are clear to auscultation. Distal pulses are well felt.  Neurological Exam :  Awake alert oriented x3.  Mild expressive aphasia with  hesitant speech with word finding difficulties.  Difficulty with naming but good comprehension and repetition.  Extraocular movements are full range without nystagmus.  Dense left homonymous hemianopsia.  Fundi not visualized.  Mild  right lower facial asymmetry.  Tongue midline.  Motor system exam no upper or lower extremity drift but mild weakness of right grip and intrinsic hand muscles.  Mild right hip flexor weakness.  Fine finger movements are diminished on the right.  Orbits left over right upper extremity.  Sensation appears preserved bilaterally.  Deep tendon reflexes symmetric.  Plantars downgoing.  Gait not tested.   ASSESSMENT/PLAN [name] is a 73 y.o. female with history of Htn, OSA, ASPVD, loop recorder implant, cancer and previous stroke presenting aphasic and confused . She did not receive IV t-PA due to late presentation.  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  Resultant mild expressive aphasia and right hemiparesis  CT head  - Old right PCA territory infarct.  MRI head - Acute/early subacute infarction of the left insula and perisylvian inferior frontal gyrus spanning  2.4 cm. Stable chronic right PCA infarction.   MRA head - not performed.  CTA H&N -   Carotid Doppler - CTA neck performed - carotid dopplers not indicated.  2D Echo -  - EF 50 - 55%. No cardiac source of emboli identified.   LDL - 86  HgbA1c - 6  UDS - not performed  VTE prophylaxis - Lovenox  Diet  - Heart healthy with thin liquids.  aspirin 81 mg daily prior to admission, now on aspirin 325 mg daily and clopidogrel 75 mg daily  Patient counseled to be compliant with her antithrombotic medications  Ongoing aggressive stroke risk factor management  Therapy recommendations:  HH PT recommended Disposition: Home Hypertension  Blood pressure somewhat high at times but within post stroke parameters  Permissive hypertension (OK if < 220/120) but gradually normalize in 5-7 days . Long-term BP goal normotensive  Hyperlipidemia  Lipid lowering medication PTA:  Lipitor 10 mg daily  LDL 86, goal < 70  Current lipid lowering medication: Lipitor  40 mg daily  Continue statin at discharge   Other Stroke Risk Factors  Advanced age  Former cigarette smoker - quit 23 months ago ?  Hx stroke/TIA  Family hx stroke (father)  Obstructive sleep apnea  Other Active Problems  Mild leukocytosis - 13.6->11.5 (afebrile)  Blood pressure somewhat high at times but within post stroke parameters    PLAN  Pt needs loop interrogation ([employer] device placed 03/21/2016 Dr Curt Bears) Apparently checked earlier this month.  04/28/2016 and no A. fib noted  ASA 81 mg daily and Plavix 75 mg daily recommended for 3 weeks then Plavix alone (was on ASA 81 mg daily PTA)  If atrial fibrillation is documented will need anticoagulation.    Hospital day # 1   .  Patient has prior history of cryptogenic stroke and has a loop recorder.  She presented with another  embolic stroke involving left MCA branch with mild aphasia and right hemiparesis.  Recommend loop recorder interrogation for paroxysmal A. fib.  Dual antiplatelet therapy of aspirin and Plavix for 3 weeks followed by aspirin alone( as she has h/o bruising on plavix in past ).  Aggressive risk factor modification.  Long discussion with the patient and answered questions.  Greater than 50% time during this 25-minute visit was spent on counseling and coordination of care about her embolic cryptogenic stroke and discussion about evaluation and treatment plan and answering questions.  Discussed with Dr. Tamala Julian.  Stroke team will sign off.  Follow-up as an outpatient in the stroke clinic in 6 weeks.  May consider possible participation in the [place] trial for cryptogenic stroke if interested  Antony Contras, MD Medical Director [place] Pager: [phone] 05/06/2018 12:30 PM   To contact Stroke Continuity provider, please refer to [email or web address] After hours, contact General Neurology

## 2018-05-06 NOTE — Progress Notes (Signed)
   05/06/18 0219  Vitals  Temp 98.1 F (36.7 C)  Temp Source Oral  BP 139/64  MAP (mmHg) 87  BP Location Left Arm  BP Method Automatic  Patient Position (if appropriate) Lying  Pulse Rate 87  Pulse Rate Source Monitor  ECG Heart Rate 88  Resp 19  Oxygen Therapy  SpO2 94 %  O2 Device Nasal Cannula  O2 Flow Rate (L/min) 2 L/min  Pain Assessment  Pain Scale 0-10  Pain Score 0  MEWS Score  MEWS RR 0  MEWS Pulse 0  MEWS Systolic 0  MEWS LOC 0  MEWS Temp 0  MEWS Score 0  MEWS Score Color Green   Pt with desaturation episode to 84 % on RA after she was up for bathroom assistance. Pt placed on 2 L via [place] with improvement in sats to > 90%

## 2018-05-07 NOTE — Progress Notes (Signed)
Occupational Therapy Progress Note  Spoke with pt's sister in law.  Per sister in law, pt has little to no support at discharge.  Eval recommendations therefore changed to SNF as she has extensive SLP and OT needs.  She may need to transition to ALF once discharged from SNF vs hired caregiver assist.   Lucille Passy, OTR/L [place] Pager [phone] Office [phone]

## 2018-05-07 NOTE — TOC Progression Note (Signed)
Transition of Care [facility]) - Progression Note    Patient Details  Name: [name] MRN: [id number] Date of Birth: 19-Nov-1945  Transition of Care [facility]) CM/SW [place], [place] Phone Number: 05/07/2018, 4:54 PM  Clinical Narrative:   Plan is for the patient to go to skilled nursing. If insurance is not able to cover the patient going, family is willing to pay for out of pocket.   Patient has been worked up and faxed out. Please call the sister-in-law with bed offers.     Expected Discharge Plan: [place] Barriers to Discharge: Continued Medical Work up, Ship broker  Expected Discharge Plan and Services Expected Discharge Plan: [place] In-house Referral: Clinical Social Work Discharge Planning Services: CM Consult Post Acute Care Choice: NA Living arrangements for the past 2 months: Single Family Home                 DME Arranged: N/A DME Agency: NA HH Arranged: NA HH Agency: NA   Social Determinants of Health (SDOH) Interventions    Readmission Risk Interventions No flowsheet data found.

## 2018-05-07 NOTE — NC FL2 (Signed)
[place] LEVEL OF CARE SCREENING TOOL     IDENTIFICATION  Patient Name: [name] Birthdate: 09-18-1945 Sex: female Admission Date (Current Location): 05/04/2018  [facility] and [place] Number:  Herbalist and Address:  [facility] [place] [place] [address] [place], [place] [address]      Provider Number: [id number]  Attending Physician Name and Address:  Norval Morton, MD  Relative Name and Phone Number:  Prentice Docker [facility]), [phone]    Current Level of Care: Hospital Recommended Level of Care: [place] Prior Approval Number:    Date Approved/Denied:   PASRR Number: Manual review  Discharge Plan: SNF    Current Diagnoses: Patient Active Problem List   Diagnosis Date Noted  . Stroke ([place])   . Expressive aphasia 05/04/2018  . Asthma 05/04/2018  . Hypertension 05/04/2018  . Depression 05/04/2018  . History of tobacco abuse 05/04/2018  . History of stroke 03/18/2016  . Peripheral vascular disease, unspecified ([place]) 05/23/2012    Orientation RESPIRATION BLADDER Height & Weight     Self, Situation, Place  Normal Continent Weight: 110 lb 3.7 oz (50 kg) Height:  5\' 5"  (165.1 cm)  BEHAVIORAL SYMPTOMS/MOOD NEUROLOGICAL BOWEL NUTRITION STATUS      Continent Diet(see DC summary)  AMBULATORY STATUS COMMUNICATION OF NEEDS Skin   Supervision Verbally(expressive aphasia) Normal                       Personal Care Assistance Level of Assistance  Bathing, Dressing, Feeding Bathing Assistance: Maximum assistance Feeding assistance: Limited assistance Dressing Assistance: Maximum assistance     Functional Limitations Info  Speech, Sight, Hearing Sight Info: Impaired Hearing Info: Adequate Speech Info: Impaired(expressive aphasia)    SPECIAL CARE FACTORS FREQUENCY  PT (By licensed PT), OT (By licensed OT), Speech therapy     PT Frequency: 5x OT Frequency: 5x/wk     Speech Therapy  Frequency: 5x/wk      Contractures Contractures Info: Not present    Additional Factors Info  Code Status, Allergies, Psychotropic Code Status Info: Full Allergies Info: Codeine, Neosporin Neomycin-bacitracin Zn-polymyx Psychotropic Info: Lexapro 20mg          Current Medications (05/07/2018):  This is the current hospital active medication list Current Facility-Administered Medications  Medication Dose Route Frequency Provider Last Rate Last Dose  .  stroke: mapping our early stages of recovery book   Does not apply Once Lenore Cordia, MD   Stopped at 05/05/18 0200  . acetaminophen (TYLENOL) tablet 650 mg  650 mg Oral Q4H PRN Lenore Cordia, MD       Or  . acetaminophen (TYLENOL) solution 650 mg  650 mg Per Tube Q4H PRN Lenore Cordia, MD       Or  . acetaminophen (TYLENOL) suppository 650 mg  650 mg Rectal Q4H PRN Zada Finders R, MD      . albuterol (PROVENTIL) (2.5 MG/3ML) 0.083% nebulizer solution 3 mL  3 mL Inhalation Q4H PRN Zada Finders R, MD      . amLODipine (NORVASC) tablet 10 mg  10 mg Oral Daily Tamala Julian, Rondell A, MD   10 mg at 05/06/18 2050  . aspirin EC tablet 81 mg  81 mg Oral Daily Smith, Rondell A, MD   81 mg at 05/07/18 0910  . atorvastatin (LIPITOR) tablet 40 mg  40 mg Oral Daily Lenore Cordia, MD   40 mg at 05/07/18 0910  . clopidogrel (  PLAVIX) tablet 75 mg  75 mg Oral Daily Kerney Elbe, MD   75 mg at 05/07/18 0910  . enoxaparin (LOVENOX) injection 40 mg  40 mg Subcutaneous Q24H Zada Finders R, MD   40 mg at 05/07/18 0910  . escitalopram (LEXAPRO) tablet 20 mg  20 mg Oral Daily Zada Finders R, MD   20 mg at 05/07/18 0910  . hydrALAZINE (APRESOLINE) injection 5 mg  5 mg Intravenous Q4H PRN Lenore Cordia, MD      . mometasone-formoterol (DULERA) 100-5 MCG/ACT inhaler 2 puff  2 puff Inhalation BID Lenore Cordia, MD   2 puff at 05/07/18 0819  . nicotine (NICODERM CQ - dosed in mg/24 hours) patch 21 mg  21 mg Transdermal Q2000 Lenore Cordia, MD   21 mg  at 05/06/18 2050  . ondansetron (ZOFRAN) injection 4 mg  4 mg Intravenous Q6H PRN Lovey Newcomer T, NP   4 mg at 05/06/18 2337  . senna-docusate (Senokot-S) tablet 1 tablet  1 tablet Oral QHS PRN Lenore Cordia, MD         Discharge Medications: Please see discharge summary for a list of discharge medications.  Relevant Imaging Results:  Relevant Lab Results:   Additional Information SS#: [id number]  Geralynn Ochs, LCSW

## 2018-05-07 NOTE — Progress Notes (Addendum)
Progress Note    [name]  MHD:[id number] DOB: 08/31/1945  DOA: 05/04/2018 PCP: Shon Baton, MD    Brief Narrative:   Chief complaint: Difficulty speaking  Medical records reviewed and are as summarized below:  [name] is an 73 y.o. female with past history significant for right PCA stroke, HTN, HLD, asthma, depression, PVD, SCC of tongue s/p excision, and tobacco use; who presents with expressive aphasia.  Assessment/Plan:   Principal Problem:   Stroke [facility]) Active Problems:   History of stroke   Expressive aphasia   Asthma   Hypertension   Depression   History of tobacco abuse  Expressive aphasia secondary to acute/subacute CVA, history of right PCA stroke: Patient presents with inability to get express what she wants to say. CTA shows left M2 superior division occlusion and perfusion deficit in left frontal operculum MRI revealing left insula and perisylvian inferior frontal gyrus strokes.  LDL not at goal.  Hemoglobin A1c 6.  PT/OT/speech recommending home health services at discharge.  Echocardiogram noted EF of 50 to 55% with impaired relaxation.  A mild decrease in function from previous echocardiogram in 2018 where EF was noted to be 55 to 60%..  Patient previously had been on aspirin alone after previous stroke right PCA stroke in March 2018.  At that time she also had a loop recorder placed.  Loop recorder last interrogated on 4/12 by Dr. Curt Bears with normal battery and no arrhythmias noted.  At baseline patient has difficulty with vision and previously had caregivers to help with meals.  However, since COVID-19 patient would go home without such help.  Patient and family concerned that she will be unable to care for self at home and now requesting rehab. -Continue PT/OT/speech -Continue statin -Aspirin and Plavix for 3 weeks, then continue on Plavix alone  -Appreciate neurology consultative services, will follow up with any further recommendation  -Will need outpatient follow-up with neurology in outpatient setting -Social work consulted working to arrange for patient to go to skilled nursing rehab  Essential hypertension: Blood pressures elevated up to 208/94.  Antihypertensives of amlodipine were initially held. -Continue amlodipine -Hydralazine IV as needed   Hyperlipidemia: Total cholesterol 158, HDL 64, LDL 86, triglycerides 38.  Goal LDL less than 70.  Patient had been on atorvastatin 10 mg daily at home. -Continue atorvastatin 40 mg daily  Leukocytosis: Acute.  WBC elevated at 13.6 on admission.  Appears to be trending down and was last noted to be 11.5.  No signs of fever overnight. -Continue to monitor  History of asthma/suspected COPD: Patient currently stable without any wheezes appreciated. -Continue pharmacy substitution for Symbicort  Prediabetes: Hemoglobin A1c 6.  Patient not on any diabetic medications at home -Continue outpatient monitoring.  Depression: On physical exam patient appears to be somewhat pressed. -Continue Lexapro  Tobacco use: Patient reported quitting smoking approximately 2 years ago. -Counseled on the need of continued cessation of tobacco use  Peripheral vascular disease -Continue statin and antiplatelet therapy  Hypoxia: Documented by respiratory to be an 87% on room air on 4/20.  Likely secondary to poor waveform or poor respiratory effort while in bed.  Patient ambulated with nursing staff in hall without note of desaturations.  Squamous cell carcinoma of the tongue -Continue outpatient follow-up and monitoring.  Low visual acuity/gait disturbance: Patient with poor on eyesight and gait is unsteady on physical exam.  Patient unable to care for herself adequately at home.  Body mass index is 18.34  kg/m.   Family Communication/Anticipated D/C date and plan/Code Status   DVT prophylaxis: Lovenox ordered. Code Status: Full Code.  Family Communication: No family present at bedside  Disposition Plan: likely discharge to rehab in 1 to 2 days pending authorization and approval.   Medical Consultants:    Neurology   Anti-Infectives:    None  Subjective:   Patient reports no problems overnight. Respiratory therapy noted patient's O2 saturations as low as 87% on room air for which she was placed to 2 L nasal cannula oxygen.  Objective:    Vitals:   05/07/18 0000 05/07/18 0401 05/07/18 0734 05/07/18 0820  BP: (!) 157/72 (!) 157/66 (!) 165/72   Pulse: 99 99    Resp: 20 (!) 23 20   Temp: 97.6 F (36.4 C) 98.3 F (36.8 C) 98.5 F (36.9 C)   TempSrc: Oral Oral Oral   SpO2: 93% 92%  91%  Weight:      Height:       No intake or output data in the 24 hours ending 05/07/18 1331 Filed Weights   05/04/18 1933 05/04/18 2221  Weight: 53.5 kg 50 kg    Exam: Constitutional: Elderly female in NAD, calm, comfortable.  Able to follow commands. Eyes: PERRL, lids and conjunctivae normal.  Decreased overall visual acuity noted. ENMT: Mucous membranes are moist. Posterior pharynx clear of any exudate or lesions. Neck: normal, supple, no masses, no thyromegaly Respiratory: Decreased aeration noted, but no significant wheezes or rhonchi appreciated. Normal respiratory effort. No accessory muscle use.  Cardiovascular: Regular rate and rhythm, no murmurs / rubs / gallops. No extremity edema. 2+ pedal pulses. No carotid bruits.  Abdomen: no tenderness, no masses palpated. No hepatosplenomegaly. Bowel sounds positive.  Musculoskeletal: no clubbing / cyanosis. No joint deformity upper and lower extremities. Good ROM, no contractures. Normal muscle tone.  Skin: no rashes, lesions, ulcers. No induration Neurologic: CN 2-12 grossly intact.  Expressive aphasia.  Strength 4+/5 on right upper /lower extremity, and strength 5/5 on the left.  Apraxia noted as well when asked to write.  Gait unsteady. Psychiatric: Normal judgment and insight. Alert and oriented x 3.  Depressed mood.     Data Reviewed:   I have personally reviewed following labs and imaging studies:  Labs: Labs show the following:   Basic Metabolic Panel: Recent Labs  Lab 05/04/18 1919 05/04/18 1928 05/05/18 0224  NA 138  --  138  K 3.9  --  4.0  CL 104  --  105  CO2 22  --  22  GLUCOSE 108*  --  124*  BUN 17  --  13  CREATININE 0.99 0.80 0.90  CALCIUM 9.2  --  9.1   GFR Estimated Creatinine Clearance: 43.9 mL/min (by C-G formula based on SCr of 0.9 mg/dL). Liver Function Tests: Recent Labs  Lab 05/04/18 1919  AST 23  ALT 14  ALKPHOS 72  BILITOT 0.6  PROT 6.9  ALBUMIN 4.2   No results for input(s): LIPASE, AMYLASE in the last 168 hours. No results for input(s): AMMONIA in the last 168 hours. Coagulation profile Recent Labs  Lab 05/04/18 1919  INR 1.0    CBC: Recent Labs  Lab 05/04/18 1919 05/05/18 0224  WBC 13.6* 11.5*  NEUTROABS 10.8*  --   HGB 14.1 13.1  HCT 43.7 40.5  MCV 94.8 91.6  PLT 214 213   Cardiac Enzymes: No results for input(s): CKTOTAL, CKMB, CKMBINDEX, TROPONINI in the last 168 hours. BNP (last 3 results)  No results for input(s): PROBNP in the last 8760 hours. CBG: Recent Labs  Lab 05/04/18 1926  GLUCAP 122*   D-Dimer: No results for input(s): DDIMER in the last 72 hours. Hgb A1c: Recent Labs    05/04/18 2126  HGBA1C 6.0*   Lipid Profile: Recent Labs    05/05/18 0224  CHOL 158  HDL 64  LDLCALC 86  TRIG 38  CHOLHDL 2.5   Thyroid function studies: No results for input(s): TSH, T4TOTAL, T3FREE, THYROIDAB in the last 72 hours.  Invalid input(s): FREET3 Anemia work up: No results for input(s): VITAMINB12, FOLATE, FERRITIN, TIBC, IRON, RETICCTPCT in the last 72 hours. Sepsis Labs: Recent Labs  Lab 05/04/18 1919 05/05/18 0224  WBC 13.6* 11.5*    Microbiology No results found for this or any previous visit (from the past 240 hour(s)).  Procedures and diagnostic studies:  Echocardiogram on 05/05/2018: Impression 1. The  left ventricle has low normal systolic function, with an ejection fraction of 50-55%. The cavity size was normal. Left ventricular diastolic Doppler parameters are consistent with impaired relaxation. No evidence of left ventricular regional wall  motion abnormalities.  2. The right ventricle has normal systolic function. The cavity was normal. There is no increase in right ventricular wall thickness.  No results found.  Medications:   .  stroke: mapping our early stages of recovery book   Does not apply Once  . amLODipine  10 mg Oral Daily  . aspirin EC  81 mg Oral Daily  . atorvastatin  40 mg Oral Daily  . clopidogrel  75 mg Oral Daily  . enoxaparin (LOVENOX) injection  40 mg Subcutaneous Q24H  . escitalopram  20 mg Oral Daily  . mometasone-formoterol  2 puff Inhalation BID  . nicotine  21 mg Transdermal Q2000   Continuous Infusions:   LOS: 2 days   Oliana Gowens A Austin Pongratz  [facility]   *Please refer to [email or web address], password [email or web address] to get updated schedule on who will round on this patient, as hospitalists switch teams weekly. If 7PM-7AM, please contact night-coverage at [email or web address], password [email or web address] for any overnight needs.

## 2018-05-07 NOTE — Progress Notes (Signed)
Rehab Admissions Coordinator Note:  Per CM request, this patient was screened by Jhonnie Garner for appropriateness for an Inpatient Acute Rehab Consult.    At this time, pt does not qualify for CIR as her mobility and ADLs are being performed at a Supervision level. AC agrees with PT/OT recommendations for [facility] therapy. CM, Angela aware of screen recommendations.   Jhonnie Garner 05/07/2018, 2:57 PM  I can be reached at [phone].

## 2018-05-07 NOTE — Progress Notes (Signed)
Physical Therapy Treatment Patient Details Name: [name] MRN: [id number] DOB: 1945-07-15 Today's Date: 05/07/2018    History of Present Illness Pt is a 73 y/o female who presents to the ED after family found her aphasic and confused. 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 PMH significant for prior CVA, PVD, HTN, CA, asthma.    PT Comments    Today's skilled session continued to address gait with dynamic components and static balance challenges with no issues reported by patient. Acute PT to continue during pt's hospital stay.   Follow Up Recommendations  Home health PT;Supervision - Intermittent     Equipment Recommendations  None recommended by PT       Precautions / Restrictions Precautions Precautions: Other (comment) Precaution Comments: Aphasic Restrictions Weight Bearing Restrictions: No    Mobility  Bed Mobility Overal bed mobility: Independent       General bed mobility comments: bed flat, no rails. no physical assist or cues needed  Transfers Overall transfer level: Needs assistance Equipment used: None Transfers: Sit to/from Stand Sit to Stand: Modified independent (Device/Increase time)         General transfer comment: no cues or assistance needed. no imbalance noted  Ambulation/Gait Ambulation/Gait assistance: Supervision;Min guard Gait Distance (Feet): 300 Feet Assistive device: None Gait Pattern/deviations: Step-through pattern;Decreased stride length;Narrow base of support Gait velocity: Decreased   General Gait Details: supervision to min guard assist as PTA engaged pt in dynamic tasks- enviromental scanning all directions, speed changes, quick pivot turns. cues needed with all gait for increased step length/stride length.     Modified Rankin (Stroke Patients Only) Modified Rankin (Stroke Patients Only) Pre-Morbid Rankin Score: No significant disability Modified Rankin:  Moderately severe disability     Balance Overall balance assessment: Needs assistance Sitting-balance support: Feet supported;No upper extremity supported Sitting balance-Leahy Scale: Good     Standing balance support: No upper extremity supported;During functional activity            High Level Balance Comments: standing next to bed with no UE support: rhomberg stance with EC no head movements, progressing to head movements with min guard to min assist for balance; tandem standing- EC no head movements for 15 sec's each foot forward with up to min assist needed for balance.             Cognition Arousal/Alertness: Awake/alert Behavior During Therapy: WFL for tasks assessed/performed Overall Cognitive Status: Difficult to assess          General Comments: Per outpatient neurological notes, pt had short term memory deficits since prior stroke        Pertinent Vitals/Pain Pain Assessment: No/denies pain Faces Pain Scale: No hurt    Home Living       Type of Home: House           PT Goals (current goals can now be found in the care plan section) Acute Rehab PT Goals Patient Stated Goal: Pt unable to state  PT Goal Formulation: With patient Time For Goal Achievement: 05/12/18 Potential to Achieve Goals: Good Progress towards PT goals: Progressing toward goals    Frequency    Min 3X/week      PT Plan Current plan remains appropriate       AM-PAC PT "6 Clicks" Mobility   Outcome Measure  Help needed turning from your back to your side while in a flat bed without using bedrails?: None Help needed moving from lying on your  back to sitting on the side of a flat bed without using bedrails?: None Help needed moving to and from a bed to a chair (including a wheelchair)?: None Help needed standing up from a chair using your arms (e.g., wheelchair or bedside chair)?: None Help needed to walk in hospital room?: A Little Help needed climbing 3-5 steps with a  railing? : A Little 6 Click Score: 22    End of Session Equipment Utilized During Treatment: Gait belt Activity Tolerance: Patient tolerated treatment well Patient left: in bed;with call bell/phone within reach;with bed alarm set Nurse Communication: Mobility status PT Visit Diagnosis: Unsteadiness on feet (R26.81);Other symptoms and signs involving the nervous system (R29.898)     Time: 4801-6553 PT Time Calculation (min) (ACUTE ONLY): 16 min  Charges:  $Neuromuscular Re-education: 8-22 mins                    Willow Ora, PTA, [facility] Acute Rehab Services Office- [phone] 05/07/18, 4:40 PM  Willow Ora 05/07/2018, 4:38 PM

## 2018-05-07 NOTE — Evaluation (Addendum)
Speech Language Pathology Evaluation Patient Details Name: [name] MRN: [id number] DOB: 07-26-1945 Today's Date: 05/07/2018 Time: 1020-1045 SLP Time Calculation (min) (ACUTE ONLY): 25 min  Problem List:  Patient Active Problem List   Diagnosis Date Noted  . Stroke ([place])   . Expressive aphasia 05/04/2018  . Asthma 05/04/2018  . Hypertension 05/04/2018  . Depression 05/04/2018  . History of tobacco abuse 05/04/2018  . History of stroke 03/18/2016  . Peripheral vascular disease, unspecified ([place]) 05/23/2012   Past Medical History:  Past Medical History:  Diagnosis Date  . Asthma   . Cancer ([place])   . Depression   . Hypertension   . Peripheral vascular disease ([place])   . Pneumonia   . Sleep apnea   . Stroke [facility])    Past Surgical History:  Past Surgical History:  Procedure Laterality Date  . DIRECT LARYNGOSCOPY Left 06/29/2016   Procedure: WIDE EXCISION LEFT TONGUE CANCER DIRECT LARYNGOSCOPY;  Surgeon: [name], MD;  Location: [facility] OR;  Service: ENT;  Laterality: Left;  . ESOPHAGOSCOPY N/A 06/29/2016   Procedure: ESOPHAGOSCOPY/BRONCHOSCOPY;  Surgeon: [name], MD;  Location: [place];  Service: ENT;  Laterality: N/A;  . EYE SURGERY Bilateral 2016  . KNEE SURGERY Right 2014  . LOOP RECORDER INSERTION N/A 03/21/2016   Procedure: Loop Recorder Insertion;  Surgeon: Will Meredith Leeds, MD;  Location: [place] CV LAB;  Service: Cardiovascular;  Laterality: N/A;  . PARS PLANA VITRECTOMY Right 02/28/2014   Procedure: PARS PLANA VITRECTOMY 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 WITH TAP AND ANTIBIOTIC INJECTION;  Surgeon: Hurman Horn, MD;  Location: [place];  Service: Ophthalmology;  Laterality: Right;  . TEE WITHOUT CARDIOVERSION N/A 03/21/2016   Procedure: TRANSESOPHAGEAL ECHOCARDIOGRAM (TEE);  Surgeon: Skeet Latch, MD;  Location: [facility] ENDOSCOPY;  Service: Cardiovascular;  Laterality: N/A;   HPI:  Pt is a 73 y/o female who presents to the ED after family found her aphasic  and confused. 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 PMH significant for prior CVA, PVD, HTN, CA, asthma.   Assessment / Plan / Recommendation Clinical Impression  Patient assessed via the Bedside Western Aphasia Battery revised, receiving a Bedside Aphasia Score of 53 and with her scores corresponding to a classification of Transcortical Motor aphasia. Patient's expressive language is mod-severely impaired and her receptive language is mildly impaired. Patient has the most difficulty with naming objects (50% accurate), responding to open-ended questions, but with good repetition at word and phrase level, good 1-2 step direction following. Patient also does not appear to have a good understanding or awareness of her deficits, as her self-monitoring is poor and no evidence of self-correcting. When responding to question, 'Why are you here (in the hospital)?', patient responded, "I have to go to the book...because I am..". SLP waited but patient did not complete her thought and did not appear aware that her response was not complete. Patient's aphasia signfiicantly impacts her ability to communicate her wants/needs and will impact her safety if she is without supervision/assistance in her communication.     SLP Assessment  SLP Recommendation/Assessment: Patient needs continued Speech Lanaguage Pathology Services SLP Visit Diagnosis: Aphasia (R47.01)    Follow Up Recommendations  SNF (Patient lives alone and is not able to effectively communicate needs for safety, ie: would not be able to successfully call and express needs to her MD, 911,etc.) Patient also does not have 24 hour support from family.   Frequency and Duration min 2-3x/week  1  week      SLP Evaluation Cognition  Overall Cognitive Status: Difficult to assess Arousal/Alertness: Awake/alert Orientation Level: Oriented to person;Oriented to situation;Oriented to place;Disoriented to  time Attention: Alternating Alternating Attention: Appears intact Awareness: Impaired Awareness Impairment: Emergent impairment Problem Solving: Appears intact(for basic level, complex not tested) Executive Function: Initiating;Self Monitoring Initiating: Appears intact Self Monitoring: Impaired Self Monitoring Impairment: Verbal basic;Verbal complex       Comprehension  Auditory Comprehension Overall Auditory Comprehension: Impaired Yes/No Questions: Impaired Complex Questions: 50-74% accurate(70%) Commands: Within Functional Limits Conversation: Simple(1 and 2-step) Reading Comprehension Reading Status: Not tested    Expression Expression Primary Mode of Expression: Verbal Verbal Expression Overall Verbal Expression: Impaired Initiation: No impairment Level of Generative/Spontaneous Verbalization: Phrase Repetition: Impaired Level of Impairment: Sentence level Naming: Impairment Responsive: 26-50% accurate(50%) Confrontation: Impaired Convergent: Not tested Divergent: Not tested Verbal Errors: Not aware of errors;Semantic paraphasias;Neologisms Pragmatics: No impairment Effective Techniques: Open ended questions;Phonemic cues Non-Verbal Means of Communication: Not applicable Written Expression Written Expression: Not tested   Oral / Motor  Oral Motor/Sensory Function Overall Oral Motor/Sensory Function: Mild impairment Facial ROM: Reduced right Facial Symmetry: Abnormal symmetry right Facial Strength: Within Functional Limits Facial Sensation: Within Functional Limits Lingual ROM: Within Functional Limits Lingual Symmetry: Within Functional Limits Lingual Strength: Within Functional Limits Velum: Within Functional Limits Mandible: Within Functional Limits   GO                    [name] 05/07/2018, 1:53 PM   Sonia Baller, MA, CCC-SLP Speech Therapy [facility] Acute Rehab Pager: [phone]

## 2018-05-07 NOTE — Progress Notes (Signed)
RT NOTES: Pt found on room air. Sats 87%. Placed back on 2lpm nasal cannula. Sats now 91%. Will continue to monitor.

## 2018-05-07 NOTE — Progress Notes (Signed)
SATURATION QUALIFICATIONS: (This note is used to comply with regulatory documentation for home oxygen)  Patient Saturations on Room Air at Rest = 93%  Patient Saturations on Room Air while Ambulating = 91%  Patient Saturations on 2 Liters of oxygen while Ambulating = 96%  Please briefly explain why patient needs home oxygen:  *Note from OT yesterday saw pt become hypoxic at rest (87%), but not during this assessment

## 2018-05-07 NOTE — TOC Initial Note (Signed)
Transition of Care [facility]) - Initial/Assessment Note    Patient Details  Name: [name] MRN: [id number] Date of Birth: 08/15/1945  Transition of Care [facility]) CM/SW Contact:    Gelene Mink, [place] Phone Number: 05/07/2018, 1:52 PM  Clinical Narrative:           RNCM spoke with the patient's sister in law. Sister-in-law is concerned about the patient returning home alone with max home health services. The patient lives alone and had neighbors assisting her. Her neighbors are no longer able to help her. It was recorded in RN notes that at one point the patient had a nurse aide at home. The patient is not able to meet the SNF criteria because she is able to walk 300 feet and does not have any other rehab needs. Her current asphasia might be her new baseline. The patient has [facility] and her insurance will not cover skilled nursing if she does not meet SNF criteria. CSW can assist with sending the patient to a skilled nursing facility but she may have to pay out of pocket for her expenses.   It is unknown what her current finances are. The patient stated that she does have a lot of money in the back. RNCM was going to called the patient's sister-in-law to see what her specific finances were.   Plan is to find out if the patient is able to afford Assisted Living out of pocket. She does not currently have medicaid.            Expected Discharge Plan: [place] Barriers to Discharge: Continued Medical Work up, Unsafe home situation   Patient Goals and CMS Choice Patient states their goals for this hospitalization and ongoing recovery are:: Patient not able to state goal      Expected Discharge Plan and Services Expected Discharge Plan: [place] In-house Referral: Clinical Social Work Discharge Planning Services: CM Consult Post Acute Care Choice: [place] arrangements for the past 2 months: Single Family Home                 DME  Arranged: N/A DME Agency: NA HH Arranged: NA HH Agency: NA  Prior Living Arrangements/Services Living arrangements for the past 2 months: [place] with:: Self Patient language and need for interpreter reviewed:: No Do you feel safe going back to the place where you live?: No(Pt is not able to safely go back home. )   Pt is not able to safely go back home.   Need for Family Participation in Patient Care: Yes (Comment) Care giver support system in place?: No (comment) Current home services: Homehealth aide Criminal Activity/Legal Involvement Pertinent to Current Situation/Hospitalization: No - Comment as needed  Activities of Daily Living Home Assistive Devices/Equipment: None ADL Screening (condition at time of admission) Patient's cognitive ability adequate to safely complete daily activities?: Yes Is the patient deaf or have difficulty hearing?: No Does the patient have difficulty seeing, even when wearing glasses/contacts?: No Does the patient have difficulty concentrating, remembering, or making decisions?: No Patient able to express need for assistance with ADLs?: Yes Does the patient have difficulty dressing or bathing?: Yes(minimal assistance) Independently performs ADLs?: No(minimal assistance) Does the patient have difficulty walking or climbing stairs?: Yes Weakness of Legs: None Weakness of Arms/Hands: Right  Permission Sought/Granted Permission sought to share information with : Case Manager, Family Supports Permission granted to share information with : Yes, Verbal Permission Granted  Share Information with NAME: Jenny Reichmann     Permission granted to share info w Relationship: Brother     Emotional Assessment Appearance:: Appears stated age Attitude/Demeanor/Rapport: Unable to Assess Affect (typically observed): Unable to Assess Orientation: : Oriented to Self, Oriented to Place, Oriented to Situation Alcohol / Substance Use: Not Applicable Psych  Involvement: No (comment)  Admission diagnosis:  Expressive aphasia [R47.01] Patient Active Problem List   Diagnosis Date Noted  . Stroke ([place])   . Expressive aphasia 05/04/2018  . Asthma 05/04/2018  . Hypertension 05/04/2018  . Depression 05/04/2018  . History of tobacco abuse 05/04/2018  . History of stroke 03/18/2016  . Peripheral vascular disease, unspecified ([place]) 05/23/2012   PCP:  Shon Baton, MD Pharmacy:   [facility] [place], [place] [place] [address] [place] [address] Phone: [phone] Fax: [phone]     Social Determinants of Health (SDOH) Interventions    Readmission Risk Interventions No flowsheet data found.

## 2018-05-07 NOTE — Progress Notes (Signed)
Occupational Therapy Treatment Patient Details Name: [name] MRN: [id number] DOB: January 23, 1945 Today's Date: 05/07/2018    History of present illness Pt is a 73 y/o female who presents to the ED after family found her aphasic and confused. 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 PMH significant for prior CVA, PVD, HTN, CA, asthma.   OT comments  Pt performs ADLs at supervision level.  She is getting better at being able express needs.   She requires mod - max cues to problem solve familiar issues such as phone usage.  Discussed with her the need for SNF rehab and she is agreeable.   Follow Up Recommendations  SNF    Equipment Recommendations  None recommended by OT    Recommendations for Other Services      Precautions / Restrictions Precautions Precautions: Other (comment) Precaution Comments: Aphasic Restrictions Weight Bearing Restrictions: No       Mobility Bed Mobility Overal bed mobility: Independent             General bed mobility comments: bed flat, no rails. no physical assist or cues needed  Transfers Overall transfer level: Modified independent Equipment used: None Transfers: Sit to/from Stand Sit to Stand: Modified independent (Device/Increase time)         General transfer comment: no cues or assistance needed. no imbalance noted    Balance Overall balance assessment: Needs assistance Sitting-balance support: Feet supported;No upper extremity supported Sitting balance-Leahy Scale: Good     Standing balance support: No upper extremity supported;During functional activity Standing balance-Leahy Scale: Good                 High Level Balance Comments: standing next to bed with no UE support: rhomberg stance with EC no head movements, progressing to head movements with min guard to min assist for balance; tandem standing- EC no head movements for 15 sec's each foot forward with up  to min assist needed for balance.            ADL either performed or assessed with clinical judgement   ADL Overall ADL's : Needs assistance/impaired     Grooming: Wash/dry hands;Wash/dry face;Oral care;Brushing hair;Supervision/safety;Standing                   Toilet Transfer: Supervision/safety;Ambulation;Comfort height toilet   Toileting- Clothing Manipulation and Hygiene: Supervision/safety;Sit to/from stand       Functional mobility during ADLs: Supervision/safety       Vision   Additional Comments: Pt with h/o Lt HH    Perception     Praxis      Cognition Arousal/Alertness: Awake/alert Behavior During Therapy: WFL for tasks assessed/performed Overall Cognitive Status: Difficult to assess                                 General Comments: Pt indicates she is unable to use her phone, and when asked, she is unable to get the screen to light up.  It was noted that pt had low battery, however, pt did not attempt to problem solve through reasons phone wasn't working and plug it in - required max A from therapist         Exercises     Shoulder Instructions       General Comments Spoke with pt's daughter in law via phone prior to session.  Updated daughter in law in current status, and  concerns for discharge and reommendation for 24 hour supervision.   Daughter in law reports they have considerable concern about pt returning home.  Spoke with both CM and SW, based on all information, discharge plan changed to SNF     Pertinent Vitals/ Pain       Pain Assessment: No/denies pain  Home Living Family/patient expects to be discharged to:: Private residence Living Arrangements: Alone Available Help at Discharge: Family;Available 24 hours/day Type of Home: House Home Access: Stairs to enter [employer] of Steps: 1 Entrance Stairs-Rails: None Home Layout: One level     Bathroom Shower/Tub: Occupational psychologist:  Standard                Prior Functioning/Environment              Frequency  Min 2X/week        Progress Toward Goals  OT Goals(current goals can now be found in the care plan section)  Progress towards OT goals: Progressing toward goals  Acute Rehab OT Goals Patient Stated Goal: Pt unable to state   Plan Discharge plan remains appropriate    Co-evaluation                 AM-PAC OT "6 Clicks" Daily Activity     Outcome Measure   Help from another person eating meals?: None Help from another person taking care of personal grooming?: A Little Help from another person toileting, which includes using toliet, bedpan, or urinal?: A Little Help from another person bathing (including washing, rinsing, drying)?: A Little Help from another person to put on and taking off regular upper body clothing?: A Little Help from another person to put on and taking off regular lower body clothing?: A Little 6 Click Score: 19    End of Session    OT Visit Diagnosis: Cognitive communication deficit (R41.841) Symptoms and signs involving cognitive functions: Cerebral infarction   Activity Tolerance Patient tolerated treatment well   Patient Left in bed;with call bell/phone within reach;with bed alarm set   Nurse Communication Mobility status        Time: 2947-6546 OT Time Calculation (min): 13 min  Charges: OT General Charges $OT Visit: 1 Visit OT Treatments $Self Care/Home Management : 8-22 mins  Lucille Passy, OTR/L Acute Rehabilitation Services Pager [phone] Office [phone]    Lucille Passy M 05/07/2018, 6:19 PM

## 2018-05-08 MED ORDER — LOSARTAN POTASSIUM 50 MG PO TABS
25.0000 mg | ORAL_TABLET | Freq: Every day | ORAL | Status: DC
  Administered 2018-05-09: 09:00:00 25 mg via ORAL
  Filled 2018-05-08: qty 1

## 2018-05-08 NOTE — Progress Notes (Signed)
Progress Note    [name]  BJY:[id number] DOB: May 30, 1945  DOA: 05/04/2018 PCP: Shon Baton, MD    Brief Narrative:   Chief complaint: Difficulty speaking  Medical records reviewed and are as summarized below:  [name] is an 73 y.o. female with past history significant for right PCA stroke, HTN, HLD, asthma, depression, PVD, SCC of tongue s/p excision, and tobacco use; who presents with expressive aphasia.  Assessment/Plan:   Principal Problem:   Stroke [facility]) Active Problems:   History of stroke   Expressive aphasia   Asthma   Hypertension   Depression   History of tobacco abuse  Expressive aphasia secondary to acute/subacute CVA, history of right PCA stroke: Patient presents with inability to get express what she wants to say. CTA shows left M2 superior division occlusion and perfusion deficit in left frontal operculum MRI revealing left insula and perisylvian inferior frontal gyrus strokes.  LDL not at goal.  Hemoglobin A1c 6.  PT/OT/speech recommending home health services at discharge.  Echocardiogram noted EF of 50 to 55% with impaired relaxation.  A mild decrease in function from previous echocardiogram in 2018 where EF was noted to be 55 to 60%.  Patient previously had been on aspirin alone after previous stroke right PCA stroke in March 2018.  At that time she also had a loop recorder placed.  Loop recorder last interrogated on 4/12 by Dr. Curt Bears with normal battery and no arrhythmias noted.  At baseline patient has difficulty with vision and previously had caregivers to help with meals.  However, since COVID-19 patient would go home without such help.  Patient and family concerned that she will be unable to care for self at home and now requesting rehab. -Continue PT/OT/speech -Continue statin -Aspirin and Plavix for 3 weeks, then continue on Plavix alone  -Appreciate neurology consultative services, will follow up with any further recommendation  -Will need outpatient follow-up with neurology in outpatient setting -Social work consulted [place] signed 4/21, patient can go to skilled nursing facility in a.m.  Essential hypertension: Blood pressures remained elevated.  Antihypertensives of amlodipine were initially held. -Continue amlodipine -Add-on losartan 25 mg daily -Hydralazine IV as needed   Hyperlipidemia: Total cholesterol 158, HDL 64, LDL 86, triglycerides 38.  Goal LDL less than 70.  Patient had been on atorvastatin 10 mg daily at home. -Continue atorvastatin 40 mg daily  Leukocytosis: Acute.  WBC elevated at 13.6 on admission.  Appears to be trending down and was last noted to be 11.5.  No signs of fever overnight. -Continue to monitor  History of asthma/suspected COPD: Patient currently stable without any wheezes appreciated. -Continue pharmacy substitution for Symbicort  Prediabetes: Hemoglobin A1c 6.  Patient not on any diabetic medications at home -Continue outpatient monitoring.  Depression: On physical exam patient appears to be somewhat pressed. -Continue Lexapro  Tobacco use: Patient reported quitting smoking approximately 2 years ago. -Counseled on the need of continued cessation of tobacco use  Peripheral vascular disease -Continue statin and antiplatelet therapy  Hypoxia: Documented by respiratory to be an 87% on room air on 4/20.  Likely secondary to poor waveform or poor respiratory effort while in bed.  Patient ambulated with nursing staff in hall without note of desaturations.  Squamous cell carcinoma of the tongue -Continue outpatient follow-up and monitoring.  Low visual acuity/gait disturbance: Patient with poor on eyesight and gait is mildly unsteady on physical exam.  Patient unable to care for herself adequately at home.  Body mass index is 18.34 kg/m.   Family Communication/Anticipated D/C date and plan/Code Status   DVT prophylaxis: Lovenox ordered. Code Status: Full Code.  Family  Communication: No family present at bedside Disposition Plan: likely discharge to rehab in 1 to 2 days pending authorization and approval.   Medical Consultants:    Neurology-signed off 4/20   Anti-Infectives:    None  Subjective:   Patient reports no problems overnight.   Objective:    Vitals:   05/08/18 0711 05/08/18 0728 05/08/18 1116 05/08/18 1516  BP: (!) 155/70  (!) 134/94 (!) 158/72  Pulse: 80  75 64  Resp: 18  15 19   Temp:   98.1 F (36.7 C)   TempSrc:   Oral   SpO2: 91% 90% 94% 94%  Weight:      Height:        Intake/Output Summary (Last 24 hours) at 05/08/2018 1823 Last data filed at 05/08/2018 1302 Gross per 24 hour  Intake 120 ml  Output -  Net 120 ml   Filed Weights   05/04/18 1933 05/04/18 2221  Weight: 53.5 kg 50 kg    Exam: Constitutional: Elderly female in NAD, calm, comfortable.  Able to follow commands. Eyes: PERRL, lids and conjunctivae normal.  Decreased visual acuity.   Able to make out number of fingers held up and approximately 10 feet, but does complain of some blurriness. ENMT: Mucous membranes are moist. Posterior pharynx clear of any exudate or lesions. Neck: normal, supple, no masses, no thyromegaly Respiratory: Decreased aeration noted, but no significant wheezes or rhonchi appreciated. Normal respiratory effort. No accessory muscle use.  Cardiovascular: Regular rate and rhythm, no murmurs / rubs / gallops. No extremity edema. 2+ pedal pulses. No carotid bruits.  Abdomen: no tenderness, no masses palpated. No hepatosplenomegaly. Bowel sounds positive.  Musculoskeletal: no clubbing / cyanosis. No joint deformity upper and lower extremities. Good ROM, no contractures. Normal muscle tone.  Skin: no rashes, lesions, ulcers. No induration Neurologic: CN 2-12 grossly intact.  Expressive aphasia.  Strength 4+/5 on right upper /lower extremity, and strength 5/5 on the left.  Apraxia noted as well when asked to write.  Gait unsteady.  Psychiatric: Normal judgment and insight. Alert and oriented x 3.  Normal mood..    Data Reviewed:   I have personally reviewed following labs and imaging studies:  Labs: Labs show the following:   Basic Metabolic Panel: Recent Labs  Lab 05/04/18 1919 05/04/18 1928 05/05/18 0224  NA 138  --  138  K 3.9  --  4.0  CL 104  --  105  CO2 22  --  22  GLUCOSE 108*  --  124*  BUN 17  --  13  CREATININE 0.99 0.80 0.90  CALCIUM 9.2  --  9.1   GFR Estimated Creatinine Clearance: 43.9 mL/min (by C-G formula based on SCr of 0.9 mg/dL). Liver Function Tests: Recent Labs  Lab 05/04/18 1919  AST 23  ALT 14  ALKPHOS 72  BILITOT 0.6  PROT 6.9  ALBUMIN 4.2   No results for input(s): LIPASE, AMYLASE in the last 168 hours. No results for input(s): AMMONIA in the last 168 hours. Coagulation profile Recent Labs  Lab 05/04/18 1919  INR 1.0    CBC: Recent Labs  Lab 05/04/18 1919 05/05/18 0224  WBC 13.6* 11.5*  NEUTROABS 10.8*  --   HGB 14.1 13.1  HCT 43.7 40.5  MCV 94.8 91.6  PLT 214 213   Cardiac Enzymes: No  results for input(s): CKTOTAL, CKMB, CKMBINDEX, TROPONINI in the last 168 hours. BNP (last 3 results) No results for input(s): PROBNP in the last 8760 hours. CBG: Recent Labs  Lab 05/04/18 1926  GLUCAP 122*   D-Dimer: No results for input(s): DDIMER in the last 72 hours. Hgb A1c: No results for input(s): HGBA1C in the last 72 hours. Lipid Profile: No results for input(s): CHOL, HDL, LDLCALC, TRIG, CHOLHDL, LDLDIRECT in the last 72 hours. Thyroid function studies: No results for input(s): TSH, T4TOTAL, T3FREE, THYROIDAB in the last 72 hours.  Invalid input(s): FREET3 Anemia work up: No results for input(s): VITAMINB12, FOLATE, FERRITIN, TIBC, IRON, RETICCTPCT in the last 72 hours. Sepsis Labs: Recent Labs  Lab 05/04/18 1919 05/05/18 0224  WBC 13.6* 11.5*    Microbiology No results found for this or any previous visit (from the past 240 hour(s)).   Procedures and diagnostic studies:  Echocardiogram on 05/05/2018: Impression 1. The left ventricle has low normal systolic function, with an ejection fraction of 50-55%. The cavity size was normal. Left ventricular diastolic Doppler parameters are consistent with impaired relaxation. No evidence of left ventricular regional wall  motion abnormalities.  2. The right ventricle has normal systolic function. The cavity was normal. There is no increase in right ventricular wall thickness.  No results found.  Medications:   .  stroke: mapping our early stages of recovery book   Does not apply Once  . amLODipine  10 mg Oral Daily  . aspirin EC  81 mg Oral Daily  . atorvastatin  40 mg Oral Daily  . clopidogrel  75 mg Oral Daily  . enoxaparin (LOVENOX) injection  40 mg Subcutaneous Q24H  . escitalopram  20 mg Oral Daily  . mometasone-formoterol  2 puff Inhalation BID  . nicotine  21 mg Transdermal Q2000   Continuous Infusions:   LOS: 3 days   Srikar Chiang A Harmoney Sienkiewicz  [facility]   *Please refer to [email or web address], password [email or web address] to get updated schedule on who will round on this patient, as hospitalists switch teams weekly. If 7PM-7AM, please contact night-coverage at [email or web address], password [email or web address] for any overnight needs.

## 2018-05-08 NOTE — Progress Notes (Addendum)
   05/08/18 1624  NIH Stroke Scale ( + Modified Stroke Scale Criteria)   Interval Shift assessment  LOC Questions (1b. )   + 0  LOC Commands (1c. )   +  0  Best Gaze (2. )  + 0  Visual (3. )  + 0  Motor Arm, Left (5a. )   + 0  Motor Arm, Right (5b. )   + 0  Motor Leg, Left (6a. )   + 0  Motor Leg, Right (6b. )   + 0  Sensory (8. )   + 0  Best Language (9. )   + 1  Dysarthria (10. ) 0  Extinction/Inattention (11.)   + 0  Modified SS Total  + 1  Patient is active with all request but doesn't speak much. Will get up and walk to the restroom and answer yes or no but seems to be confused about her surroundings. Asked where she was and her name and she was able to tell me her name and that she was at the hospital. Did not remember what day it is.  Cathlean Marseilles Tamala Julian, MSN, RN, [facility]

## 2018-05-08 NOTE — Progress Notes (Signed)
RT NOTES: Patient found on room air. Sats 87%. Placed back on 2lpm nasal cannula. Sats now 91%. Will continue to monitor.

## 2018-05-08 NOTE — Plan of Care (Addendum)
STROKE CARE PLAN Problem: Education: Goal: Knowledge of secondary prevention will improve Outcome: Progressing Goal: Knowledge of patient specific risk factors addressed and post discharge goals established will improve Outcome: Progressing

## 2018-05-08 NOTE — TOC Progression Note (Addendum)
Transition of Care [facility]) - Progression Note    Patient Details  Name: [name] MRN: [id number] Date of Birth: 1945/09/23  Transition of Care [facility]) CM/SW [place], [place] Phone Number: 05/08/2018, 10:17 AM  Clinical Narrative:  1:45pm- Offers given and CMS ratings to Silva Bandy, she and her family have done research and would like [facility] or [employer] [facility]. Olivia Mackie with [facility] states she will have a bed tomorrow.   PASRR pending, need 30 day note signed, paged MD for this.     10:17am- Spoke with pt sister in law Debe Coder- [email or web address]; she would like information regarding SNF options and Medicaid planning for ALF in the future.   Expected Discharge Plan: [place] Barriers to Discharge: Continued Medical Work up, Ship broker; PASRR  Expected Discharge Plan and Services Expected Discharge Plan: [place] In-house Referral: Clinical Social Work Discharge Planning Services: [employer] Consult Post Acute Care Choice: NA Living arrangements for the past 2 months: Single Family Home                 DME Arranged: N/A DME Agency: NA HH Arranged: NA HH Agency: NA   Social Determinants of Health (SDOH) Interventions    Readmission Risk Interventions No flowsheet data found.

## 2018-05-08 NOTE — Plan of Care (Signed)
  Problem: Education: Goal: Knowledge of secondary prevention will improve Outcome: Progressing Goal: Knowledge of patient specific risk factors addressed and post discharge goals established will improve Outcome: Progressing   Problem: Health Behavior/Discharge Planning: Goal: Ability to manage health-related needs will improve Outcome: Progressing   Problem: Clinical Measurements: Goal: Ability to maintain clinical measurements within normal limits will improve Outcome: Progressing Goal: Will remain free from infection Outcome: Progressing Goal: Diagnostic test results will improve Outcome: Progressing Goal: Respiratory complications will improve Outcome: Progressing Goal: Cardiovascular complication will be avoided Outcome: Progressing   Problem: Activity: Goal: Risk for activity intolerance will decrease Outcome: Progressing   Problem: Elimination: Goal: Will not experience complications related to bowel motility Outcome: Progressing Goal: Will not experience complications related to urinary retention Outcome: Progressing   Problem: Safety: Goal: Ability to remain free from injury will improve Outcome: Progressing   Problem: Skin Integrity: Goal: Risk for impaired skin integrity will decrease Outcome: Progressing

## 2018-05-08 NOTE — Progress Notes (Signed)
Physical Therapy Treatment Patient Details Name: [name] MRN: [id number] DOB: Jun 07, 1945 Today's Date: 05/08/2018    History of Present Illness Pt is a 73 y.o. female admitted 05/04/18 after family found her aphasic and confused. 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 PMH significant for prior CVA, PVD, HTN, CA, asthma.   PT Comments    Pt progressing well with mobility. Although moving well, pt requires assist to problem solve and safely perform common functional tasks (i.e. find a certain room number on unit) due to current cognitive impairments. Pt with decreased insight into deficits, poor problem solving, decreased short-term memory and some perseveration noted this session. Recommend SNF-level therapies to maximize functional mobility and independence prior to return home.    Follow Up Recommendations  SNF;Supervision for mobility/OOB     Equipment Recommendations  None recommended by PT    Recommendations for Other Services       Precautions / Restrictions Precautions Precautions: Fall;Other (comment) Precaution Comments: Aphasic Restrictions Weight Bearing Restrictions: No    Mobility  Bed Mobility                  Transfers Overall transfer level: Independent Equipment used: None Transfers: Sit to/from Stand              Ambulation/Gait Ambulation/Gait assistance: Scientist, forensic (Feet): 300 Feet Assistive device: None Gait Pattern/deviations: Step-through pattern;Decreased stride length;Narrow base of support Gait velocity: Decreased Gait velocity interpretation: 1.31 - 2.62 ft/sec, indicative of limited community ambulator General Gait Details: Slow, mostly steady gait with supervision for safety; no overt instability or LOB. Requiring supervision as pt unable to navigate hallway or find room without max cues   Stairs             Wheelchair Mobility    Modified  Rankin (Stroke Patients Only) Modified Rankin (Stroke Patients Only) Pre-Morbid Rankin Score: No significant disability Modified Rankin: Moderately severe disability     Balance Overall balance assessment: Needs assistance Sitting-balance support: Feet supported;No upper extremity supported Sitting balance-Leahy Scale: Good     Standing balance support: No upper extremity supported;During functional activity Standing balance-Leahy Scale: Good                              Cognition Arousal/Alertness: Awake/alert Behavior During Therapy: WFL for tasks assessed/performed Overall Cognitive Status: Difficult to assess Area of Impairment: Attention;Memory;Following commands;Safety/judgement;Problem solving                   Current Attention Level: Sustained Memory: Decreased short-term memory Following Commands: Follows one step commands inconsistently Safety/Judgement: Decreased awareness of safety;Decreased awareness of deficits   Problem Solving: Requires verbal cues General Comments: Was given room number to find while walking in hallway, pt forgetting number multiple times, required max, repeated cues throughout process to find room; upon return to room asked pt if she could have done that by herself, pt stating yes      Exercises      General Comments        Pertinent Vitals/Pain Pain Assessment: No/denies pain    Home Living                      Prior Function            PT Goals (current goals can now be found in the care plan section) Acute Rehab PT Goals Patient  Stated Goal: None stated PT Goal Formulation: With patient Time For Goal Achievement: 05/12/18 Potential to Achieve Goals: Good Progress towards PT goals: Progressing toward goals    Frequency    Min 3X/week      PT Plan Discharge plan needs to be updated    Co-evaluation              AM-PAC PT "6 Clicks" Mobility   Outcome Measure  Help needed  turning from your back to your side while in a flat bed without using bedrails?: None Help needed moving from lying on your back to sitting on the side of a flat bed without using bedrails?: None Help needed moving to and from a bed to a chair (including a wheelchair)?: None Help needed standing up from a chair using your arms (e.g., wheelchair or bedside chair)?: None Help needed to walk in hospital room?: A Little Help needed climbing 3-5 steps with a railing? : A Little 6 Click Score: 22    End of Session   Activity Tolerance: Patient tolerated treatment well Patient left: in chair;with call bell/phone within reach;with chair alarm set Nurse Communication: Mobility status PT Visit Diagnosis: Unsteadiness on feet (R26.81);Other symptoms and signs involving the nervous system (R29.898)     Time: 7588-3254 PT Time Calculation (min) (ACUTE ONLY): 20 min  Charges:  $Neuromuscular Re-education: 8-22 mins                    Mabeline Caras, PT, DPT Acute Rehabilitation Services  Pager [phone] Office [place] 05/08/2018, 5:10 PM

## 2018-05-08 NOTE — Progress Notes (Signed)
  Speech Language Pathology Treatment: Cognitive-Linquistic  Patient Details Name: [name] MRN: [id number] DOB: 22-Jul-1945 Today's Date: 05/08/2018 Time: 9242-6834 SLP Time Calculation (min) (ACUTE ONLY): 25 min  Assessment / Plan / Recommendation Clinical Impression  Pt was seen for aphasia treatment and she reported that it is easier to communicate today compared to yesterday. Her verbal output was more meaningful than was observed during yesterday's evaluation and she produced some spontaneous speech related to her needs (e.g., "I'm going to need to go to the bathroom") but perseveration was still noted. She was educated regarding discharge recommendations and speech pathology's safety concerns related to her impaired communication, and she verbalized understanding. She completed confrontational naming with 90% accuracy and demonstrated 50% accuracy with responsive naming increasing to 100% accuracy with min-mod cues. She achieved 20% accuracy with sentence formulation related to single-action pictures increasing to 80% accuracy with mod cues. She was able to answer simple and complex yes/no questions with 100% and 80% accuracy, respectively. At the simple paragraph level she demonstrated 40% accuracy with responding to open-ended questions increasing to 70% accuracy with choice cues. SLP will continue to follow pt.    HPI HPI: Pt is a 73 y/o female who presents to the ED after family found her aphasic and confused. 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 PMH significant for prior CVA, PVD, HTN, CA, asthma.      SLP Plan  Continue with current plan of care       Recommendations  Medication Administration: Whole meds with liquid Compensations: Minimize environmental distractions;Slow rate;Small sips/bites                Oral Care Recommendations: Oral care BID Follow up Recommendations: Skilled Nursing facility SLP Visit  Diagnosis: Aphasia (R47.01) Plan: Continue with current plan of care       Brigid Vandekamp I. Hardin Negus, [place], [place] Office number [phone] Pager [place] 05/08/2018, 2:41 PM

## 2018-05-09 DIAGNOSIS — J449 Chronic obstructive pulmonary disease, unspecified: Secondary | ICD-10-CM

## 2018-05-09 MED ORDER — ACETAMINOPHEN 325 MG PO TABS: 650.0000 mg | ORAL_TABLET | Freq: Four times a day (QID) | ORAL | Status: DC | PRN

## 2018-05-09 MED ORDER — AMLODIPINE BESYLATE 10 MG PO TABS: 10.0000 mg | ORAL_TABLET | Freq: Every day | ORAL | Status: DC

## 2018-05-09 MED ORDER — LOSARTAN POTASSIUM 25 MG PO TABS: 25.0000 mg | ORAL_TABLET | Freq: Every day | ORAL | Status: DC

## 2018-05-09 MED ORDER — NICOTINE 21 MG/24HR TD PT24: 21.0000 mg | MEDICATED_PATCH | Freq: Every day | TRANSDERMAL | Status: DC

## 2018-05-09 MED ORDER — CLOPIDOGREL BISULFATE 75 MG PO TABS: 75.0000 mg | ORAL_TABLET | Freq: Every day | ORAL | Status: DC

## 2018-05-09 NOTE — Discharge Summary (Signed)
Physician Discharge Summary  [name] GQB:[id number] DOB: [birth year] DOA: 05/04/2018  PCP: Shon Baton, MD  Admit date: 05/04/2018 Discharge date: 05/09/2018  Time spent: 35 minutes  Recommendations for Outpatient Follow-up:  1. Repeat basic metabolic panel in 1 week to follow electrolytes and renal function 2. Assessment patient on further adjust antihypertensive treatment as needed.   Discharge Diagnoses:  Principal Problem:   Stroke [facility]) Active Problems:   History of stroke   Expressive aphasia   Asthma with COPD ([place])   Hypertension   Depression   History of tobacco abuse   Discharge Condition: Stable and improved.  Patient discharged to skilled nursing facility for further care and rehabilitation.  Diet recommendation: Heart healthy diet  Filed Weights   05/04/18 1933 05/04/18 2221  Weight: 53.5 kg 50 kg    History of present illness:  As per H&P written by Dr. Posey Pronto on 05/04/2018 [name] is a 73 y.o. 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  History is mostly limited to yes/no questioning due to expressive aphasia, however patient is able to string together short sentences infrequently.  She says she was at her usual state of health until around 4 PM today, 05/04/2018, when she went to use the telephone and realized she was unable to speak.  She says she is able to understand others when spoken to and knows what she wants to say however cannot get the appropriate words out.  She denies any associated headache, lightheadedness, dizziness, change in vision, chest pain, palpitations, dyspnea, fevers, chills, diaphoresis, abdominal pain, or new weakness in her arms or legs.  Patient has been taking aspirin 81 mg  daily.  She is a current smoker about 1 pack/day.  She denies any alcohol or illicit drug use.  She had an implantable loop recorder placed at time of prior cryptogenic stroke in March 2018.  Device checks since that time not showing any arrhythmia with most recent check on 04/29/2018.  Per ED documentation, family spoke to her last at 55 PM 05/03/2018 at which time she appeared to be at her baseline.  We did check on her today and she was aphasic and confused.  EMS were called and she was brought to the ED.  ED Course:  Initial vitals in the ED showed BP 194/88, pulse 93, RR 21, temp 98.4 Fahrenheit, SPO2 97% on room air.  Labs are notable for WBC 13.6, hemoglobin 14.1, platelets 214, BUN 17, creatinine 0.99.  CT head without contrast showed an old right PCA territory infarct without acute intracranial hemorrhage, hydrocephalus, mass lesion, or fluid collection.  Atrophy with chronic small vessel white matter ischemic disease was noted.  Neurology were consulted and recommended admission for stroke work-up.   Hospital Course:  1-expressive aphasia secondary to acute/subacute CVA: Appears to be cryptogenic stroke -Patient with prior history of right PCA stroke -CTA demonstrated left M2 superior division occlusion and perfusion deficit in the left frontal operculum. -Social worker MRI revealing left insula and perisylvian inferior frontal gyrus strokes. -LDL 86 -A1c 6.0 -Follow hematology recommendations patient will be discharged on antiplatelet dual therapy (Plavix) for 3 months and subsequently the use of just Plavix. -Outpatient follow-up and further recommendations will be arranged -Physical therapy has evaluated patient and recommended skilled nursing facility for further  rehabilitation care. -Patient advised to quit smoking and will continue pursuing risk factor modifications.  His  2-essential hypertension -Continue amlodipine and losartan -Heart healthy diet has been  encouraged.  3-hyperlipidemia -Continue Lipitor  4-asthma with COPD -no wheezing -Good O2 sat on room air -Patient advised to quit smoking -Continue the use of as needed albuterol and Symbicort.  5-tobacco abuse -continue nicotine patch -cessation counseling provided  6-PVD -stable -continue statins and antiplatelet therapy  7-depression -stable mood -no hallucinations or SI -Continue Lexapro.  8-physical deconditioning  -as mentioned above discharge to SNF for further care and rehab. -residual vision loss and poor balance currently  Procedures:  See below for x-ray reports   Consultations:  Neurology  Discharge Exam: Vitals:   05/09/18 0724 05/09/18 0802  BP: (!) 142/59   Pulse: 77   Resp: 18   Temp: 97.9 F (36.6 C)   SpO2: 90% 91%    General: Afebrile, in no acute distress.  Patient following commands appropriately and able to answer questions. Patient still having som hesitation while answering, suggesting expressive aphasia.  No chest pain, no nausea, no vomiting, no shortness of breath. Cardiovascular: S1 and S2, no gallops, no murmurs. No rubs and no JVD. Respiratory: Clear to auscultation bilaterally. Abd: soft, NT, ND, positive BS Extremities: no edema, no cyanosis   Discharge Instructions   Discharge Instructions    Diet - low sodium heart healthy   Complete by:  As directed    Discharge instructions   Complete by:  As directed    Follow heart healthy diet Maintain adequate hydration Physical therapy and rehabilitation as per the skilled nursing facility protocol Outpatient follow-up in 2 weeks after discharge from skilled nursing facility.     Allergies as of 05/09/2018      Reactions   Codeine Itching   Mouth broke out   Neosporin [neomycin-bacitracin Zn-polymyx] Itching, Swelling   swelling at application site      Medication List    TAKE these medications   acetaminophen 325 MG tablet Commonly known as:  TYLENOL Take 2  tablets (650 mg total) by mouth every 6 (six) hours as needed for mild pain or headache (or temp > 37.5 C (99.5 F)).   albuterol (2.5 MG/3ML) 0.083% nebulizer solution Commonly known as:  PROVENTIL Take 2.5 mg by nebulization every 6 (six) hours as needed for wheezing or shortness of breath.   amLODipine 10 MG tablet Commonly known as:  NORVASC Take 1 tablet (10 mg total) by mouth daily.   aspirin 81 MG tablet Take 1 tablet (81 mg total) by mouth daily.   atorvastatin 10 MG tablet Commonly known as:  LIPITOR Take 10 mg by mouth daily.   azelastine 0.1 % nasal spray Commonly known as:  ASTELIN Place 1 spray into both nostrils 2 (two) times daily as needed for allergies. Use in each nostril as directed   budesonide-formoterol 80-4.5 MCG/ACT inhaler Commonly known as:  SYMBICORT Inhale 2 puffs into the lungs 2 (two) times daily as needed (FOR RESPIRATORY ISSUES.).   carboxymethylcellulose 0.5 % Soln Commonly known as:  REFRESH PLUS Place 1 drop into both eyes daily as needed (dry eyes).   clopidogrel 75 MG tablet Commonly known as:  PLAVIX Take 1 tablet (75 mg total) by mouth daily. Start taking on:  May 10, 2018   escitalopram 20 MG tablet Commonly known as:  LEXAPRO Take 20 mg by mouth daily.   losartan 25 MG tablet Commonly known as:  COZAAR Take  1 tablet (25 mg total) by mouth daily. Start taking on:  May 10, 2018   nicotine 21 mg/24hr patch Commonly known as:  NICODERM CQ - dosed in mg/24 hours Place 1 patch (21 mg total) onto the skin daily at 8 pm.   Vitamin D 50 MCG (2000 UT) Caps Take 2,000 Units by mouth daily.      Allergies  Allergen Reactions  . Codeine Itching    Mouth broke out  . Neosporin [Neomycin-Bacitracin Zn-Polymyx] Itching and Swelling    swelling at application site    Contact information for follow-up providers    Shon Baton, MD. Schedule an appointment as soon as possible for a visit in 2 week(s).   Specialty:  Internal  Medicine Why:  after discharge from SNF Contact information: [place] [address] [phone]            Contact information for after-discharge care    Destination    [facility] .   Service:  Skilled Nursing Contact information: [address] [place] [phone]                  The results of significant diagnostics from this hospitalization (including imaging, microbiology, ancillary and laboratory) are listed below for reference.    Significant Diagnostic Studies: Ct Angio Head W Or Wo Contrast  Result Date: 05/04/2018 CLINICAL DATA:  Aphasia.  Right arm weakness. EXAM: CT ANGIOGRAPHY HEAD AND NECK CT PERFUSION BRAIN TECHNIQUE: Multidetector CT imaging of the head and neck was performed using the standard protocol during bolus administration of intravenous contrast. Multiplanar CT image reconstructions and MIPs were obtained to evaluate the vascular anatomy. Carotid stenosis measurements (when applicable) are obtained utilizing NASCET criteria, using the distal internal carotid diameter as the denominator. Multiphase CT imaging of the brain was performed following IV bolus contrast injection. Subsequent parametric perfusion maps were calculated using RAPID software. CONTRAST:  17mL OMNIPAQUE IOHEXOL 350 MG/ML SOLN COMPARISON:  Head and neck MRA 03/18/2016 FINDINGS: CTA NECK FINDINGS Aortic arch: Standard 3 vessel aortic arch with moderate atherosclerotic plaque. Prominent soft plaque in the proximal left subclavian artery results in just under 50% stenosis. Right carotid system: Patent with calcified plaque at the carotid bifurcation and in the distal cervical ICA without evidence of significant stenosis or dissection. Left carotid system: Patent with moderate volume calcified plaque at the carotid bifurcation without evidence of significant stenosis or dissection. Vertebral arteries: Chronic right  vertebral artery occlusion near its origin with only minimal intermittent reconstitution in the neck. Patent left vertebral artery with chronic mild-to-moderate V1 segment stenosis. Skeleton: Mild cervical spondylosis. Moderate cervical facet arthrosis. Other neck: No mass or enlarged lymph nodes. Upper chest: Biapical pleuroparenchymal lung scarring. Mild centrilobular emphysema. Review of the MIP images confirms the above findings CTA HEAD FINDINGS Anterior circulation: The internal carotid arteries are patent from skull base to carotid termini with mild-to-moderate plaque bilaterally not resulting in significant stenosis. ACAs and MCAs are patent without significant A1 or M1 stenosis. There is new occlusion of a proximal left M2 branch vessel with distal reconstitution. No aneurysm is identified. Posterior circulation: The intracranial left vertebral artery is widely patent and supplies the basilar. There is retrograde opacification of the right vertebral artery with patent PICAs bilaterally. The basilar artery is widely patent. There is a robust left posterior communicating artery. The left PCA is patent with mild branch vessel irregularity. The right PCA is occluded at the distal P1  level, more proximally than on the 2018 MRA where there was occlusion beginning at the mid P2 level. No aneurysm is identified. Venous sinuses: Patent. Anatomic variants: None. Review of the MIP images confirms the above findings CT Brain Perfusion Findings: ASPECTS: 9 upon reassessment of earlier noncontrast CT with small region of acute infarct in the left insula CBF (<30%) Volume: 54mL Perfusion (Tmax>6.0s) volume: 64mL Mismatch Volume: 15mL, though with caveat that automated analysis did not detect the early small infarct apparent in the left insula on noncontrast CT Infarction Location: No core infarct. Delayed perfusion/penumbra in the left MCA territory involving the posterior frontal and anterior parietal lobes including in  the operculum. IMPRESSION: 1. Proximal left M2 occlusion with distal reconstitution. 2. Left MCA ischemia with penumbra as detailed above. ASPECTS of 9 upon reassessment of earlier noncontrast CT. 3. 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 4. Chronic 50% proximal left subclavian and mild-to-moderate proximal left vertebral artery stenoses. 5. No significant carotid artery stenosis. 6. Aortic Atherosclerosis (ICD10-I70.0) and Emphysema (ICD10-J43.9). These results were called by telephone at the time of interpretation on 05/04/2018 at 9:50 pm to Dr. Kerney Elbe , who verbally acknowledged these results. Electronically Signed   By: Logan Bores M.D.   On: 05/04/2018 22:20   Ct Head Wo Contrast  Result Date: 05/04/2018 CLINICAL DATA:  Altered level of consciousness. EXAM: CT HEAD WITHOUT CONTRAST TECHNIQUE: Contiguous axial images were obtained from the base of the skull through the vertex without intravenous contrast. COMPARISON:  03/18/2016 FINDINGS: Brain: Old right PCA territory infarct noted. There is no evidence for acute hemorrhage, hydrocephalus, mass lesion, or abnormal extra-axial fluid collection. No definite CT evidence for acute infarction. Diffuse loss of parenchymal volume is consistent with atrophy. Patchy low attenuation in the deep hemispheric and periventricular white matter is nonspecific, but likely reflects chronic microvascular ischemic demyelination. Vascular: No hyperdense vessel or unexpected calcification. Skull: No evidence for fracture. No worrisome lytic or sclerotic lesion. Sinuses/Orbits: The visualized paranasal sinuses and mastoid air cells are clear. Visualized portions of the globes and intraorbital fat are unremarkable. Other: None. IMPRESSION: 1. 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 No acute intracranial abnormality. 2. Atrophy with chronic small vessel white matter ischemic disease. Electronically Signed   By: Misty Stanley M.D.   On: 05/04/2018 19:41   Ct  Angio Neck W Or Wo Contrast  Result Date: 05/04/2018 CLINICAL DATA:  Aphasia.  Right arm weakness. EXAM: CT ANGIOGRAPHY HEAD AND NECK CT PERFUSION BRAIN TECHNIQUE: Multidetector CT imaging of the head and neck was performed using the standard protocol during bolus administration of intravenous contrast. Multiplanar CT image reconstructions and MIPs were obtained to evaluate the vascular anatomy. Carotid stenosis measurements (when applicable) are obtained utilizing NASCET criteria, using the distal internal carotid diameter as the denominator. Multiphase CT imaging of the brain was performed following IV bolus contrast injection. Subsequent parametric perfusion maps were calculated using RAPID software. CONTRAST:  157mL OMNIPAQUE IOHEXOL 350 MG/ML SOLN COMPARISON:  Head and neck MRA 03/18/2016 FINDINGS: CTA NECK FINDINGS Aortic arch: Standard 3 vessel aortic arch with moderate atherosclerotic plaque. Prominent soft plaque in the proximal left subclavian artery results in just under 50% stenosis. Right carotid system: Patent with calcified plaque at the carotid bifurcation and in the distal cervical ICA without evidence of significant stenosis or dissection. Left carotid system: Patent with moderate volume calcified plaque at the carotid bifurcation without evidence of significant stenosis or dissection. Vertebral arteries: Chronic right vertebral artery occlusion  near its origin with only minimal intermittent reconstitution in the neck. Patent left vertebral artery with chronic mild-to-moderate V1 segment stenosis. Skeleton: Mild cervical spondylosis. Moderate cervical facet arthrosis. Other neck: No mass or enlarged lymph nodes. Upper chest: Biapical pleuroparenchymal lung scarring. Mild centrilobular emphysema. Review of the MIP images confirms the above findings CTA HEAD FINDINGS Anterior circulation: The internal carotid arteries are patent from skull base to carotid termini with mild-to-moderate plaque  bilaterally not resulting in significant stenosis. ACAs and MCAs are patent without significant A1 or M1 stenosis. There is new occlusion of a proximal left M2 branch vessel with distal reconstitution. No aneurysm is identified. Posterior circulation: The intracranial left vertebral artery is widely patent and supplies the basilar. There is retrograde opacification of the right vertebral artery with patent PICAs bilaterally. The basilar artery is widely patent. There is a robust left posterior communicating artery. The left PCA is patent with mild branch vessel irregularity. The right PCA is occluded at the distal P1 level, more proximally than on the 2018 MRA where there was occlusion beginning at the mid P2 level. No aneurysm is identified. Venous sinuses: Patent. Anatomic variants: None. Review of the MIP images confirms the above findings CT Brain Perfusion Findings: ASPECTS: 9 upon reassessment of earlier noncontrast CT with small region of acute infarct in the left insula CBF (<30%) Volume: 8mL Perfusion (Tmax>6.0s) volume: 16mL Mismatch Volume: 86mL, though with caveat that automated analysis did not detect the early small infarct apparent in the left insula on noncontrast CT Infarction Location: No core infarct. Delayed perfusion/penumbra in the left MCA territory involving the posterior frontal and anterior parietal lobes including in the operculum. IMPRESSION: 1. Proximal left M2 occlusion with distal reconstitution. 2. Left MCA ischemia with penumbra as detailed above. ASPECTS of 9 upon reassessment of earlier noncontrast CT. 3. 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 4. Chronic 50% proximal left subclavian and mild-to-moderate proximal left vertebral artery stenoses. 5. No significant carotid artery stenosis. 6. Aortic Atherosclerosis (ICD10-I70.0) and Emphysema (ICD10-J43.9). These results were called by telephone at the time of interpretation on 05/04/2018 at 9:50 pm to Dr. Kerney Elbe , who verbally acknowledged these results. Electronically Signed   By: Logan Bores M.D.   On: 05/04/2018 22:20   Mr Brain Wo Contrast  Result Date: 05/05/2018 CLINICAL DATA:  73 y/o F; expressive aphasia. Stroke for follow-up. EXAM: MRI HEAD WITHOUT CONTRAST TECHNIQUE: Multiplanar, multiecho pulse sequences of the brain and surrounding structures were obtained without intravenous contrast. COMPARISON:  05/04/2018 CT head, CTA, CT perfusion head FINDINGS: Brain: Area of cortical reduced diffusion spanning 2.4 cm centered within the left insula and perisylvian inferior frontal gyrus compatible with acute/early subacute infarction. Stable chronic right PCA infarction. Few punctate nonspecific T2 FLAIR hyperintensities in subcortical and periventricular white matter are compatible with mild chronic microvascular ischemic changes. Mild volume loss of the brain. No extra-axial collection, hydrocephalus, mass effect, or herniation. There is mild hemosiderin staining in the right occipital cortex within the region of chronic infarction. No additional abnormal susceptibility hypointensity to indicate intracranial hemorrhage. Vascular: Normal flow voids. Skull and upper cervical spine: Normal marrow signal. Sinuses/Orbits: Partial opacification of the left mastoid air cells. No abnormal signal of the occluded paranasal sinuses or the right mastoid air cells. Bilateral intra-ocular lens replacement. Other: None. IMPRESSION: 1. Acute/early subacute infarction of the left insula and perisylvian inferior frontal gyrus spanning 2.4 cm. No associated hemorrhage or mass effect. The infarction appears grossly stable  in distribution in comparison with the visible infarct on prior CT and CTA of head given differences in technique. 2. Stable chronic right PCA infarction. 3. Mild chronic microvascular ischemic changes and volume loss of the brain. Electronically Signed   By: Kristine Garbe M.D.   On: 05/05/2018  03:35   Ct Cerebral Perfusion W Contrast  Result Date: 05/04/2018 CLINICAL DATA:  Aphasia.  Right arm weakness. EXAM: CT ANGIOGRAPHY HEAD AND NECK CT PERFUSION BRAIN TECHNIQUE: Multidetector CT imaging of the head and neck was performed using the standard protocol during bolus administration of intravenous contrast. Multiplanar CT image reconstructions and MIPs were obtained to evaluate the vascular anatomy. Carotid stenosis measurements (when applicable) are obtained utilizing NASCET criteria, using the distal internal carotid diameter as the denominator. Multiphase CT imaging of the brain was performed following IV bolus contrast injection. Subsequent parametric perfusion maps were calculated using RAPID software. CONTRAST:  170mL OMNIPAQUE IOHEXOL 350 MG/ML SOLN COMPARISON:  Head and neck MRA 03/18/2016 FINDINGS: CTA NECK FINDINGS Aortic arch: Standard 3 vessel aortic arch with moderate atherosclerotic plaque. Prominent soft plaque in the proximal left subclavian artery results in just under 50% stenosis. Right carotid system: Patent with calcified plaque at the carotid bifurcation and in the distal cervical ICA without evidence of significant stenosis or dissection. Left carotid system: Patent with moderate volume calcified plaque at the carotid bifurcation without evidence of significant stenosis or dissection. Vertebral arteries: Chronic right vertebral artery occlusion near its origin with only minimal intermittent reconstitution in the neck. Patent left vertebral artery with chronic mild-to-moderate V1 segment stenosis. Skeleton: Mild cervical spondylosis. Moderate cervical facet arthrosis. Other neck: No mass or enlarged lymph nodes. Upper chest: Biapical pleuroparenchymal lung scarring. Mild centrilobular emphysema. Review of the MIP images confirms the above findings CTA HEAD FINDINGS Anterior circulation: The internal carotid arteries are patent from skull base to carotid termini with  mild-to-moderate plaque bilaterally not resulting in significant stenosis. ACAs and MCAs are patent without significant A1 or M1 stenosis. There is new occlusion of a proximal left M2 branch vessel with distal reconstitution. No aneurysm is identified. Posterior circulation: The intracranial left vertebral artery is widely patent and supplies the basilar. There is retrograde opacification of the right vertebral artery with patent PICAs bilaterally. The basilar artery is widely patent. There is a robust left posterior communicating artery. The left PCA is patent with mild branch vessel irregularity. The right PCA is occluded at the distal P1 level, more proximally than on the 2018 MRA where there was occlusion beginning at the mid P2 level. No aneurysm is identified. Venous sinuses: Patent. Anatomic variants: None. Review of the MIP images confirms the above findings CT Brain Perfusion Findings: ASPECTS: 9 upon reassessment of earlier noncontrast CT with small region of acute infarct in the left insula CBF (<30%) Volume: 27mL Perfusion (Tmax>6.0s) volume: 26mL Mismatch Volume: 47mL, though with caveat that automated analysis did not detect the early small infarct apparent in the left insula on noncontrast CT Infarction Location: No core infarct. Delayed perfusion/penumbra in the left MCA territory involving the posterior frontal and anterior parietal lobes including in the operculum. IMPRESSION: 1. Proximal left M2 occlusion with distal reconstitution. 2. Left MCA ischemia with penumbra as detailed above. ASPECTS of 9 upon reassessment of earlier noncontrast CT. 3. 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 4. Chronic 50% proximal left subclavian and mild-to-moderate proximal left vertebral artery stenoses. 5. No significant carotid artery stenosis. 6. Aortic Atherosclerosis (ICD10-I70.0) and Emphysema (  ICD10-J43.9). These results were called by telephone at the time of interpretation on 05/04/2018  at 9:50 pm to Dr. Kerney Elbe , who verbally acknowledged these results. Electronically Signed   By: Logan Bores M.D.   On: 05/04/2018 22:20    Labs: Basic Metabolic Panel: Recent Labs  Lab 05/04/18 1919 05/04/18 1928 05/05/18 0224  NA 138  --  138  K 3.9  --  4.0  CL 104  --  105  CO2 22  --  22  GLUCOSE 108*  --  124*  BUN 17  --  13  CREATININE 0.99 0.80 0.90  CALCIUM 9.2  --  9.1   Liver Function Tests: Recent Labs  Lab 05/04/18 1919  AST 23  ALT 14  ALKPHOS 72  BILITOT 0.6  PROT 6.9  ALBUMIN 4.2   CBC: Recent Labs  Lab 05/04/18 1919 05/05/18 0224  WBC 13.6* 11.5*  NEUTROABS 10.8*  --   HGB 14.1 13.1  HCT 43.7 40.5  MCV 94.8 91.6  PLT 214 213   CBG: Recent Labs  Lab 05/04/18 1926  GLUCAP 122*    Signed:  Barton Dubois MD.  [facility] 05/09/2018, 10:50 AM

## 2018-05-09 NOTE — Progress Notes (Signed)
  Speech Language Pathology Treatment: Cognitive-Linquistic(Aphasia)  Patient Details Name: [name] MRN: [id number] DOB: 11-10-1945 Today's Date: 05/09/2018 Time: 1200-1224 SLP Time Calculation (min) (ACUTE ONLY): 24 min  Assessment / Plan / Recommendation Clinical Impression  Pt reported that she believes that communication is easier for her today than it was yesterday. She achieved 100% accuracy with sentence completion and better able to correct neologistic paraphasias. She demonstrated 60% accuracy with responsive naming increasing to 100% accuracy with min-mod cues. She achieved 40% accuracy with sentence formulation related to single-action pictures increasing to 100% accuracy with min-mod cues. With auditory comprehension of the simple paragraphs she improved to 50% accuracy increasing to 100% accuracy with choice cues. SLP will continue to follow pt.    HPI HPI: Pt is a 73 y/o female who presents to the ED after family found her aphasic and confused. 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 PMH significant for prior CVA, PVD, HTN, CA, asthma.      SLP Plan  Continue with current plan of care       Recommendations  Medication Administration: Whole meds with liquid Compensations: Minimize environmental distractions;Slow rate;Small sips/bites                Oral Care Recommendations: Oral care BID Follow up Recommendations: Skilled Nursing facility SLP Visit Diagnosis: Aphasia (R47.01) Plan: Continue with current plan of care       Money Mckeithan I. Hardin Negus, [place], [place] Office number [phone] Pager [place] 05/09/2018, 1:28 PM

## 2018-05-09 NOTE — TOC Transition Note (Signed)
Transition of Care [facility]) - CM/SW Discharge Note   Patient Details  Name: [name] MRN: [id number] Date of Birth: [birth year]  [facility] CM/SW Contact:  Benard Halsted, LCSW Phone Number: 05/09/2018, 2:07 PM   Clinical Narrative:    [facility] Anticipated DC date: 05/09/18 Family notified: Silva Bandy, sister-in-law Transport by: Corey Harold   Per MD patient ready for DC to [facility]. RN, patient, patient's family, and facility notified of DC. Discharge Summary and FL2 sent to facility. RN to call report prior to discharge [phone]). DC packet on chart. Ambulance transport requested for patient.   CSW will sign off for now as social work intervention is no longer needed. Please consult [place] again if new needs arise.  Cedric Fishman, LCSW Clinical Social Worker [phone]    Final next level of care: Skilled Nursing Facility Barriers to Discharge: Barriers Resolved   Patient Goals and CMS Choice Patient states their goals for this hospitalization and ongoing recovery are:: Pt sister-in-law would like her sister to have rehab so that she is able to return home safely CMS Medicare.gov Compare Post Acute Care list provided to:: Patient Represenative (must comment)(pt sister in law emailed offers) Choice offered to / list presented to : Sibling  Discharge Placement              Patient [facility] bed at: [facility] Patient to be transferred to facility by: [place] Name of family member notified: pt sister in law [place] Patient and family notified of of transfer: 05/09/18  Discharge Plan and Services In-house Referral: Clinical Social Work Discharge Planning Services: [employer] Consult Post Acute Care Choice: NA          DME Arranged: N/A DME Agency: NA HH Arranged: NA [place] Agency: NA   Social Determinants of Health (SDOH) Interventions     Readmission Risk Interventions No flowsheet data found.

## 2018-05-09 NOTE — Care Management Important Message (Signed)
Important Message  Patient Details  Name: [name] MRN: [id number] Date of Birth: 07-Feb-1945   Medicare Important Message Given:  Yes    [name] 05/09/2018, 3:08 PM

## 2018-05-09 NOTE — Plan of Care (Signed)
  Problem: Education: Goal: Knowledge of secondary prevention will improve Outcome: Progressing Goal: Knowledge of patient specific risk factors addressed and post discharge goals established will improve Outcome: Progressing   Problem: Health Behavior/Discharge Planning: Goal: Ability to manage health-related needs will improve Outcome: Progressing   Problem: Clinical Measurements: Goal: Ability to maintain clinical measurements within normal limits will improve Outcome: Progressing Goal: Will remain free from infection Outcome: Progressing Goal: Diagnostic test results will improve Outcome: Progressing Goal: Respiratory complications will improve Outcome: Progressing Goal: Cardiovascular complication will be avoided Outcome: Progressing   Problem: Activity: Goal: Risk for activity intolerance will decrease Outcome: Progressing   Problem: Elimination: Goal: Will not experience complications related to bowel motility Outcome: Progressing Goal: Will not experience complications related to urinary retention Outcome: Progressing   Problem: Safety: Goal: Ability to remain free from injury will improve Outcome: Progressing   Problem: Skin Integrity: Goal: Risk for impaired skin integrity will decrease Outcome: Progressing

## 2018-05-09 NOTE — Progress Notes (Signed)
Nsg Discharge Note  Admit Date:  05/04/2018 Discharge date: 05/09/2018   [name] to be D/C'd Skilled nursing facility per MD order.    Discharge Medication: Allergies as of 05/09/2018      Reactions   Codeine Itching   Mouth broke out   Neosporin [neomycin-bacitracin Zn-polymyx] Itching, Swelling   swelling at application site      Medication List    TAKE these medications   acetaminophen 325 MG tablet Commonly known as:  TYLENOL Take 2 tablets (650 mg total) by mouth every 6 (six) hours as needed for mild pain or headache (or temp > 37.5 C (99.5 F)).   albuterol (2.5 MG/3ML) 0.083% nebulizer solution Commonly known as:  PROVENTIL Take 2.5 mg by nebulization every 6 (six) hours as needed for wheezing or shortness of breath.   amLODipine 10 MG tablet Commonly known as:  NORVASC Take 1 tablet (10 mg total) by mouth daily.   aspirin 81 MG tablet Take 1 tablet (81 mg total) by mouth daily.   atorvastatin 10 MG tablet Commonly known as:  LIPITOR Take 10 mg by mouth daily.   azelastine 0.1 % nasal spray Commonly known as:  ASTELIN Place 1 spray into both nostrils 2 (two) times daily as needed for allergies. Use in each nostril as directed   budesonide-formoterol 80-4.5 MCG/ACT inhaler Commonly known as:  SYMBICORT Inhale 2 puffs into the lungs 2 (two) times daily as needed (FOR RESPIRATORY ISSUES.).   carboxymethylcellulose 0.5 % Soln Commonly known as:  REFRESH PLUS Place 1 drop into both eyes daily as needed (dry eyes).   clopidogrel 75 MG tablet Commonly known as:  PLAVIX Take 1 tablet (75 mg total) by mouth daily. Start taking on:  May 10, 2018   escitalopram 20 MG tablet Commonly known as:  LEXAPRO Take 20 mg by mouth daily.   losartan 25 MG tablet Commonly known as:  COZAAR Take 1 tablet (25 mg total) by mouth daily. Start taking on:  May 10, 2018   nicotine 21 mg/24hr patch Commonly known as:  NICODERM CQ - dosed in mg/24 hours Place 1  patch (21 mg total) onto the skin daily at 8 pm.   Vitamin D 50 MCG (2000 UT) Caps Take 2,000 Units by mouth daily.       Discharge Assessment: Vitals:   05/09/18 0724 05/09/18 0802  BP: (!) 142/59   Pulse: 77   Resp: 18   Temp: 97.9 F (36.6 C)   SpO2: 90% 91%   Skin clean, dry and intact without evidence of skin break down, no evidence of skin tears noted. IV catheter discontinued intact. Site without signs and symptoms of complications - no redness or edema noted at insertion site, patient denies c/o pain - only slight tenderness at site.  Dressing with slight pressure applied.  D/c Instructions-Education: Discharge instructions given to patient/family with verbalized understanding. D/c education completed with patient/family including follow up instructions, medication list, d/c activities limitations if indicated, with other d/c instructions as indicated by MD - patient able to verbalize understanding, all questions fully answered. Patient instructed to return to ED, call 911, or call MD for any changes in condition.  Patient escorted via stretcher and ptar ambulance services.  Hiram Comber, RN 05/09/2018 1:57 PM

## 2018-05-09 NOTE — Progress Notes (Signed)
Physical Therapy Treatment Patient Details Name: [name] MRN: [id number] DOB: [birth year] Today's Date: 05/09/2018    History of Present Illness Pt is a 73 y.o. female admitted 05/04/18 after family found her aphasic and confused. 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 PMH significant for prior CVA, PVD, HTN, CA, asthma.    PT Comments    Pt performed gait training and functional memory task to return to room without cueing.  Pt continues to require min to moderate assistance to locate her room but was able to recall room number.  Continue to recommend SNF to improve strength and endurance.  This will also allow for her to receive speech therapy to improve cognition before return home.   Follow Up Recommendations  SNF;Supervision for mobility/OOB     Equipment Recommendations  None recommended by PT    Recommendations for Other Services       Precautions / Restrictions Precautions Precautions: Fall;Other (comment) Precaution Comments: Aphasic Restrictions Weight Bearing Restrictions: No    Mobility  Bed Mobility Overal bed mobility: Independent             General bed mobility comments: bed flat, no rails. no physical assist or cues needed  Transfers Overall transfer level: Modified independent Equipment used: None Transfers: Sit to/from Stand Sit to Stand: Modified independent (Device/Increase time)            Ambulation/Gait Ambulation/Gait assistance: Supervision Gait Distance (Feet): 300 Feet Assistive device: None Gait Pattern/deviations: Step-through pattern;Decreased stride length;Narrow base of support     General Gait Details: Slow, mostly steady gait with supervision for safety; no overt instability or LOB. Requiring supervision as pt unable to navigate hallway or find room without max cues   Stairs             Wheelchair Mobility    Modified Rankin (Stroke Patients  Only) Modified Rankin (Stroke Patients Only) Pre-Morbid Rankin Score: No significant disability Modified Rankin: Moderately severe disability     Balance Overall balance assessment: Needs assistance Sitting-balance support: Feet supported;No upper extremity supported Sitting balance-Leahy Scale: Good       Standing balance-Leahy Scale: Good                              Cognition Arousal/Alertness: Awake/alert Behavior During Therapy: WFL for tasks assessed/performed Overall Cognitive Status: Difficult to assess                                 General Comments: Pt required moderate cueing to locate room, but remembered her room number.        Exercises      General Comments        Pertinent Vitals/Pain Pain Assessment: No/denies pain Faces Pain Scale: No hurt    Home Living                      Prior Function            PT Goals (current goals can now be found in the care plan section) Acute Rehab PT Goals Patient Stated Goal: None stated Potential to Achieve Goals: Good Progress towards PT goals: Progressing toward goals    Frequency    Min 3X/week      PT Plan Discharge plan needs to be updated    Co-evaluation  AM-PAC PT "6 Clicks" Mobility   Outcome Measure  Help needed turning from your back to your side while in a flat bed without using bedrails?: None Help needed moving from lying on your back to sitting on the side of a flat bed without using bedrails?: None Help needed moving to and from a bed to a chair (including a wheelchair)?: None Help needed standing up from a chair using your arms (e.g., wheelchair or bedside chair)?: None Help needed to walk in hospital room?: A Little Help needed climbing 3-5 steps with a railing? : A Little 6 Click Score: 22    End of Session Equipment Utilized During Treatment: Gait belt Activity Tolerance: Patient tolerated treatment well Patient left: in  chair;with call bell/phone within reach;with chair alarm set Nurse Communication: Mobility status PT Visit Diagnosis: Unsteadiness on feet (R26.81);Other symptoms and signs involving the nervous system (R29.898)     Time: 1497-0263 PT Time Calculation (min) (ACUTE ONLY): 19 min  Charges:  $Gait Training: 8-22 mins                     Governor Rooks, PTA Acute Rehabilitation Services Pager [phone] Office [phone]     Rashi Granier Eli Hose 05/09/2018, 1:58 PM

## 2018-05-09 NOTE — TOC Progression Note (Addendum)
Transition of Care [facility]) - Progression Note    Patient Details  Name: [name] MRN: [id number] Date of Birth: 1945-03-30  Transition of Care [facility]) CM/SW [place], LCSW Phone Number: 05/09/2018, 10:42 AM  Clinical Narrative:    [facility] able [facility] patient today if medically stable. [employer] representative, B Akin, called to complete screening with patient. [facility] agreeing [facility] patient with pasrr waiver.    Expected Discharge Plan: Skilled Nursing Facility Barriers to Discharge: Barriers Resolved  Expected Discharge Plan and Services Expected Discharge Plan: [place] In-house Referral: Clinical Social Work Discharge Planning Services: CM Consult Post Acute Care Choice: NA Living arrangements for the past 2 months: Single Family Home                 DME Arranged: N/A DME Agency: NA HH Arranged: NA HH Agency: NA   Social Determinants of Health (SDOH) Interventions    Readmission Risk Interventions No flowsheet data found.

## 2018-05-09 NOTE — Plan of Care (Signed)
  Problem: Education: Goal: Knowledge of secondary prevention will improve 05/09/2018 0556 by Rogelia Boga, RN Outcome: Progressing 05/09/2018 0314 by Rogelia Boga, RN Outcome: Progressing Goal: Knowledge of patient specific risk factors addressed and post discharge goals established will improve 05/09/2018 0556 by Rogelia Boga, RN Outcome: Progressing 05/09/2018 0314 by Rogelia Boga, RN Outcome: Progressing   Problem: Health Behavior/Discharge Planning: Goal: Ability to manage health-related needs will improve Outcome: Progressing   Problem: Clinical Measurements: Goal: Ability to maintain clinical measurements within normal limits will improve Outcome: Progressing Goal: Will remain free from infection Outcome: Progressing Goal: Diagnostic test results will improve Outcome: Progressing Goal: Respiratory complications will improve Outcome: Progressing Goal: Cardiovascular complication will be avoided Outcome: Progressing   Problem: Activity: Goal: Risk for activity intolerance will decrease Outcome: Progressing   Problem: Elimination: Goal: Will not experience complications related to bowel motility Outcome: Progressing Goal: Will not experience complications related to urinary retention Outcome: Progressing   Problem: Safety: Goal: Ability to remain free from injury will improve Outcome: Progressing   Problem: Skin Integrity: Goal: Risk for impaired skin integrity will decrease Outcome: Progressing

## 2018-05-11 NOTE — Progress Notes (Signed)
[facility] Summary Report / Loop Recorder 

## 2018-05-22 ENCOUNTER — Telehealth: Payer: Self-pay | Admitting: Cardiology

## 2018-05-22 NOTE — Telephone Encounter (Signed)
Spoke w/ pt and requested that she send a manual transmission with her home monitor. She stated that she will try to send the transmission tonight.

## 2018-05-23 NOTE — Telephone Encounter (Signed)
Full transmission reviewed. AF episode SR with periods of noise.

## 2018-05-23 NOTE — Telephone Encounter (Signed)
Transmission received.

## 2018-05-31 ENCOUNTER — Other Ambulatory Visit: Payer: Self-pay

## 2018-05-31 ENCOUNTER — Ambulatory Visit (INDEPENDENT_AMBULATORY_CARE_PROVIDER_SITE_OTHER): Payer: Medicare Other | Admitting: *Deleted

## 2018-05-31 DIAGNOSIS — I639 Cerebral infarction, unspecified: Secondary | ICD-10-CM

## 2018-06-01 LAB — CUP PACEART REMOTE DEVICE CHECK
Date Time Interrogation Session: 20200515014039
Implantable Pulse Generator Implant Date: 20180305

## 2018-06-07 NOTE — Progress Notes (Signed)
[facility] Summary Report / Loop Recorder 

## 2018-07-03 ENCOUNTER — Ambulatory Visit (INDEPENDENT_AMBULATORY_CARE_PROVIDER_SITE_OTHER): Payer: Medicare Other | Admitting: *Deleted

## 2018-07-03 DIAGNOSIS — I639 Cerebral infarction, unspecified: Secondary | ICD-10-CM | POA: Diagnosis not present

## 2018-07-04 LAB — CUP PACEART REMOTE DEVICE CHECK
Date Time Interrogation Session: 20200617020729
Implantable Pulse Generator Implant Date: 20180305

## 2018-07-10 ENCOUNTER — Other Ambulatory Visit: Payer: Self-pay

## 2018-07-10 ENCOUNTER — Inpatient Hospital Stay (HOSPITAL_COMMUNITY)
Admission: EM | Admit: 2018-07-10 | Discharge: 2018-07-13 | DRG: 378 | Disposition: A | Discharge status: Home / Self Care | Payer: Medicare Other | Attending: Internal Medicine | Admitting: Internal Medicine

## 2018-07-10 DIAGNOSIS — Z7951 Long term (current) use of inhaled steroids: Secondary | ICD-10-CM | POA: Diagnosis not present

## 2018-07-10 DIAGNOSIS — K31811 Angiodysplasia of stomach and duodenum with bleeding: Secondary | ICD-10-CM | POA: Diagnosis present

## 2018-07-10 DIAGNOSIS — Z87891 Personal history of nicotine dependence: Secondary | ICD-10-CM | POA: Diagnosis not present

## 2018-07-10 DIAGNOSIS — D509 Iron deficiency anemia, unspecified: Secondary | ICD-10-CM | POA: Diagnosis present

## 2018-07-10 DIAGNOSIS — Z7982 Long term (current) use of aspirin: Secondary | ICD-10-CM | POA: Diagnosis not present

## 2018-07-10 DIAGNOSIS — Z8673 Personal history of transient ischemic attack (TIA), and cerebral infarction without residual deficits: Secondary | ICD-10-CM | POA: Diagnosis not present

## 2018-07-10 DIAGNOSIS — I69320 Aphasia following cerebral infarction: Secondary | ICD-10-CM

## 2018-07-10 DIAGNOSIS — Z7902 Long term (current) use of antithrombotics/antiplatelets: Secondary | ICD-10-CM | POA: Diagnosis not present

## 2018-07-10 DIAGNOSIS — E785 Hyperlipidemia, unspecified: Secondary | ICD-10-CM | POA: Diagnosis present

## 2018-07-10 DIAGNOSIS — K625 Hemorrhage of anus and rectum: Secondary | ICD-10-CM

## 2018-07-10 DIAGNOSIS — K922 Gastrointestinal hemorrhage, unspecified: Secondary | ICD-10-CM | POA: Diagnosis present

## 2018-07-10 DIAGNOSIS — K921 Melena: Secondary | ICD-10-CM | POA: Diagnosis not present

## 2018-07-10 DIAGNOSIS — D62 Acute posthemorrhagic anemia: Secondary | ICD-10-CM | POA: Diagnosis present

## 2018-07-10 DIAGNOSIS — J42 Unspecified chronic bronchitis: Secondary | ICD-10-CM | POA: Diagnosis not present

## 2018-07-10 DIAGNOSIS — K449 Diaphragmatic hernia without obstruction or gangrene: Secondary | ICD-10-CM | POA: Diagnosis present

## 2018-07-10 DIAGNOSIS — K2901 Acute gastritis with bleeding: Secondary | ICD-10-CM | POA: Diagnosis present

## 2018-07-10 DIAGNOSIS — J449 Chronic obstructive pulmonary disease, unspecified: Secondary | ICD-10-CM | POA: Diagnosis present

## 2018-07-10 DIAGNOSIS — Z1159 Encounter for screening for other viral diseases: Secondary | ICD-10-CM

## 2018-07-10 DIAGNOSIS — I739 Peripheral vascular disease, unspecified: Secondary | ICD-10-CM | POA: Diagnosis present

## 2018-07-10 DIAGNOSIS — Z85810 Personal history of malignant neoplasm of tongue: Secondary | ICD-10-CM

## 2018-07-10 DIAGNOSIS — D649 Anemia, unspecified: Secondary | ICD-10-CM

## 2018-07-10 DIAGNOSIS — I10 Essential (primary) hypertension: Secondary | ICD-10-CM | POA: Diagnosis present

## 2018-07-10 DIAGNOSIS — F329 Major depressive disorder, single episode, unspecified: Secondary | ICD-10-CM | POA: Diagnosis present

## 2018-07-10 DIAGNOSIS — Z79899 Other long term (current) drug therapy: Secondary | ICD-10-CM

## 2018-07-10 LAB — CBC WITH DIFFERENTIAL/PLATELET
Abs Immature Granulocytes: 0.02 10*3/uL (ref 0.00–0.07)
Basophils Absolute: 0.1 10*3/uL (ref 0.0–0.1)
Basophils Relative: 1 %
Eosinophils Absolute: 0.1 10*3/uL (ref 0.0–0.5)
Eosinophils Relative: 2 %
HCT: 20.5 % — ABNORMAL LOW (ref 36.0–46.0)
Hemoglobin: 6.1 g/dL — CL (ref 12.0–15.0)
Immature Granulocytes: 0 %
Lymphocytes Relative: 29 %
Lymphs Abs: 1.7 10*3/uL (ref 0.7–4.0)
MCH: 27.7 pg (ref 26.0–34.0)
MCHC: 29.8 g/dL — ABNORMAL LOW (ref 30.0–36.0)
MCV: 93.2 fL (ref 80.0–100.0)
Monocytes Absolute: 0.6 10*3/uL (ref 0.1–1.0)
Monocytes Relative: 9 %
Neutro Abs: 3.4 10*3/uL (ref 1.7–7.7)
Neutrophils Relative %: 59 %
Platelets: 345 10*3/uL (ref 150–400)
RBC: 2.2 MIL/uL — ABNORMAL LOW (ref 3.87–5.11)
RDW: 14.6 % (ref 11.5–15.5)
WBC: 5.9 10*3/uL (ref 4.0–10.5)
nRBC: 0 % (ref 0.0–0.2)

## 2018-07-10 LAB — COMPREHENSIVE METABOLIC PANEL
ALT: 15 U/L (ref 0–44)
AST: 21 U/L (ref 15–41)
Albumin: 3.4 g/dL — ABNORMAL LOW (ref 3.5–5.0)
Alkaline Phosphatase: 65 U/L (ref 38–126)
Anion gap: 8 (ref 5–15)
BUN: 21 mg/dL (ref 8–23)
CO2: 24 mmol/L (ref 22–32)
Calcium: 8.8 mg/dL — ABNORMAL LOW (ref 8.9–10.3)
Chloride: 104 mmol/L (ref 98–111)
Creatinine, Ser: 1 mg/dL (ref 0.44–1.00)
GFR calc Af Amer: >60 mL/min (ref >60)
GFR calc non Af Amer: 55 mL/min — ABNORMAL LOW (ref >60)
Glucose, Bld: 110 mg/dL — ABNORMAL HIGH (ref 70–99)
Potassium: 4.2 mmol/L (ref 3.5–5.1)
Sodium: 136 mmol/L (ref 135–145)
Total Bilirubin: 0.2 mg/dL — ABNORMAL LOW (ref 0.3–1.2)
Total Protein: 5.9 g/dL — ABNORMAL LOW (ref 6.5–8.1)

## 2018-07-10 LAB — ABO/RH: ABO/RH(D): O POS

## 2018-07-10 LAB — POC OCCULT BLOOD, ED: Fecal Occult Bld: POSITIVE — AB

## 2018-07-10 LAB — PREPARE RBC (CROSSMATCH)

## 2018-07-10 MED ORDER — SODIUM CHLORIDE 0.9% IV SOLUTION
Freq: Once | INTRAVENOUS | Status: AC
  Administered 2018-07-10: 10 mL/h via INTRAVENOUS

## 2018-07-10 NOTE — ED Provider Notes (Addendum)
[facility] EMERGENCY DEPARTMENT Provider Note   CSN: [id number] Arrival date & time: 07/10/18  1343    History   Chief Complaint Chief Complaint  Patient presents with  . Abnormal Lab    HPI [name] is a 73 y.o. female.     HPI   She presents for evaluation of hemoglobin found to be low routine testing today.  Patient has aphasia, and has difficulty giving complete history.  She was hospitalized in April, with a stroke, and at that time, hemoglobin was 13.1.  Patient has not seen any blood in her stool.  She denies chest pain, shortness of breath, weakness or dizziness.  She had laboratory testing today done as an outpatient.  Denies fever, chills, nausea, vomiting, focal weakness or paresthesia.  There are no other known modifying factors.  Past Medical History:  Diagnosis Date  . Asthma   . Cancer ([place])   . Depression   . Hypertension   . Peripheral vascular disease ([place])   . Pneumonia   . Sleep apnea   . Stroke [facility])     Patient Active Problem List   Diagnosis Date Noted  . GI bleed 07/10/2018  . Stroke ([place])   . Expressive aphasia 05/04/2018  . Asthma with COPD ([place]) 05/04/2018  . Hypertension 05/04/2018  . Depression 05/04/2018  . History of tobacco abuse 05/04/2018  . History of stroke 03/18/2016  . Peripheral vascular disease, unspecified ([place]) 05/23/2012    Past Surgical History:  Procedure Laterality Date  . DIRECT LARYNGOSCOPY Left 06/29/2016   Procedure: WIDE EXCISION LEFT TONGUE CANCER DIRECT LARYNGOSCOPY;  Surgeon: [name], MD;  Location: [facility] OR;  Service: ENT;  Laterality: Left;  . ESOPHAGOSCOPY N/A 06/29/2016   Procedure: ESOPHAGOSCOPY/BRONCHOSCOPY;  Surgeon: [name], MD;  Location: [facility];  Service: ENT;  Laterality: N/A;  . EYE SURGERY Bilateral 2016  . KNEE SURGERY Right 2014  . LOOP RECORDER INSERTION N/A 03/21/2016   Procedure: Loop Recorder Insertion;  Surgeon: Will Meredith Leeds, MD;  Location: [place] CV LAB;  Service: Cardiovascular;  Laterality: N/A;  . PARS PLANA VITRECTOMY Right 02/28/2014   Procedure: PARS PLANA VITRECTOMY 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 WITH TAP AND ANTIBIOTIC INJECTION;  Surgeon: Hurman Horn, MD;  Location: [facility];  Service: Ophthalmology;  Laterality: Right;  . TEE WITHOUT CARDIOVERSION N/A 03/21/2016   Procedure: TRANSESOPHAGEAL ECHOCARDIOGRAM (TEE);  Surgeon: Skeet Latch, MD;  Location: [facility] ENDOSCOPY;  Service: Cardiovascular;  Laterality: N/A;     OB History   No obstetric history on file.      Home Medications    Prior to Admission medications   Medication Sig Start Date End Date Taking? Authorizing Provider  acetaminophen (TYLENOL) 325 MG tablet Take 2 tablets (650 mg total) by mouth every 6 (six) hours as needed for mild pain or headache (or temp > 37.5 C (99.5 F)). 05/09/18   Barton Dubois, MD  albuterol (PROVENTIL) (2.5 MG/3ML) 0.083% nebulizer solution Take 2.5 mg by nebulization every 6 (six) hours as needed for wheezing or shortness of breath.    [provider]  amLODipine (NORVASC) 10 MG tablet Take 1 tablet (10 mg total) by mouth daily. 05/09/18   Barton Dubois, MD  aspirin 81 MG tablet Take 1 tablet (81 mg total) by mouth daily. 11/08/16   Ward Givens, NP  atorvastatin (LIPITOR) 10 MG tablet Take 10 mg by mouth daily.  03/16/16   [provider]  azelastine (ASTELIN) 0.1 % nasal  spray Place 1 spray into both nostrils 2 (two) times daily as needed for allergies. Use in each nostril as directed    [provider]  budesonide-formoterol (SYMBICORT) 80-4.5 MCG/ACT inhaler Inhale 2 puffs into the lungs 2 (two) times daily as needed (FOR RESPIRATORY ISSUES.).    [provider]  carboxymethylcellulose (REFRESH PLUS) 0.5 % SOLN Place 1 drop into both eyes daily as needed (dry eyes).    [provider]  Cholecalciferol (VITAMIN D) 2000 units CAPS Take 2,000 Units by mouth daily.    [provider]  clopidogrel (PLAVIX) 75 MG tablet Take 1 tablet (75 mg total) by mouth daily. 05/10/18   Barton Dubois, MD  escitalopram (LEXAPRO) 20 MG tablet Take 20 mg by mouth daily.  07/10/14   [provider]  losartan (COZAAR) 25 MG tablet Take 1 tablet (25 mg total) by mouth daily. 05/10/18   Barton Dubois, MD  nicotine (NICODERM CQ - DOSED IN MG/24 HOURS) 21 mg/24hr patch Place 1 patch (21 mg total) onto the skin daily at 8 pm. 05/09/18   Barton Dubois, MD    Family History Family History  Problem Relation Age of Onset  . Cancer Mother        colon  . Stroke Father   . Heart disease Brother     Social History Social History   Tobacco Use  . Smoking status: Former Smoker    Quit date: 05/29/2016    Years since quitting: 2.1  . Smokeless tobacco: Never Used  Substance Use Topics  . Alcohol use: No  . Drug use: No     Allergies   Codeine and Neosporin [neomycin-bacitracin zn-polymyx]   Review of Systems Review of Systems  All other systems reviewed and are negative.    Physical Exam Updated Vital Signs BP (!) 158/61   Pulse 66   Temp 97.8 F (36.6 C) (Oral)   Resp 18   SpO2 100%   Physical Exam Vitals signs and nursing note reviewed.  Constitutional:      General: She is not in acute distress.    Appearance: She is well-developed. She is not ill-appearing, toxic-appearing or diaphoretic.  HENT:     Head: Normocephalic and atraumatic.     Right Ear: External ear normal.     Left Ear: External ear normal.     Mouth/Throat:     Pharynx: No oropharyngeal exudate or posterior oropharyngeal erythema.  Eyes:     Conjunctiva/sclera: Conjunctivae normal.     Pupils: Pupils are equal, round, and reactive to light.  Neck:     Musculoskeletal: Normal range of motion and neck supple.     Trachea: Phonation normal.  Cardiovascular:     Rate and Rhythm: Normal rate and regular rhythm.     Heart sounds: Normal heart sounds.  Pulmonary:     Effort:  Pulmonary effort is normal.     Breath sounds: Normal breath sounds.  Abdominal:     Palpations: Abdomen is soft.     Tenderness: There is no abdominal tenderness.  Genitourinary:    Comments: Dark brown stool present in rectal vault.  No abnormalities of the anus, mass in exam or fecal impaction. Musculoskeletal: Normal range of motion.  Skin:    General: Skin is warm and dry.  Neurological:     Mental Status: She is alert and oriented to person, place, and time.     Cranial Nerves: No cranial nerve deficit.     Sensory: No  sensory deficit.     Motor: No abnormal muscle tone.     Coordination: Coordination normal.  Psychiatric:        Behavior: Behavior normal.        Thought Content: Thought content normal.        Judgment: Judgment normal.      ED Treatments / Results  Labs (all labs ordered are listed, but only abnormal results are displayed) Labs Reviewed  CBC WITH DIFFERENTIAL/PLATELET - Abnormal; Notable for the following components:      Result Value   RBC 2.20 (*)    Hemoglobin 6.1 (*)    HCT 20.5 (*)    MCHC 29.8 (*)    All other components within normal limits  COMPREHENSIVE METABOLIC PANEL - Abnormal; Notable for the following components:   Glucose, Bld 110 (*)    Calcium 8.8 (*)    Total Protein 5.9 (*)    Albumin 3.4 (*)    Total Bilirubin 0.2 (*)    GFR calc non Af Amer 55 (*)    All other components within normal limits  POC OCCULT BLOOD, ED - Abnormal; Notable for the following components:   Fecal Occult Bld POSITIVE (*)    All other components within normal limits  NOVEL CORONAVIRUS, NAA (HOSPITAL ORDER, SEND-OUT TO REF LAB)  PREPARE RBC (CROSSMATCH)  TYPE AND SCREEN  ABO/RH    EKG None  Radiology No results found.  Procedures .Critical Care Performed by: Daleen Bo, MD Authorized by: Daleen Bo, MD   Critical care provider statement:    Critical care time (minutes):  35   Critical care start time:  07/10/2018 4:40 PM    Critical care end time:  07/10/2018 5:29 PM   Critical care time was exclusive of:  Separately billable procedures and treating other patients   Critical care was necessary to treat or prevent imminent or life-threatening deterioration of the following conditions:  Circulatory failure   Critical care was time spent personally by me on the following activities:  Blood draw for specimens, development of treatment plan with patient or surrogate, discussions with consultants, evaluation of patient's response to treatment, examination of patient, obtaining history from patient or surrogate, ordering and performing treatments and interventions, ordering and review of laboratory studies, pulse oximetry, re-evaluation of patient's condition, review of old charts and ordering and review of radiographic studies   (including critical care time)  Medications Ordered in ED Medications  0.9 %  sodium chloride infusion (Manually program via Guardrails IV Fluids) (has no administration in time range)     Initial Impression / Assessment and Plan / ED Course  I have reviewed the triage vital signs and the nursing notes.  Pertinent labs & imaging results that were available during my care of the patient were reviewed by me and considered in my medical decision making (see chart for details).  Clinical Course as of Jul 10 1815  Tue Jul 10, 2018  1658 Abnormal, blood is present  POC occult blood, ED Provider will collect(!) [EW]  1658 Normal except glucose high, calcium low, total protein low, albumin low, GFR low.  Comprehensive metabolic panel(!) [EW]  8299 Normal except hemoglobin low  CBC with Differential(!!) [EW]  1700 I discussed the case with Dr. Virgina Jock who is a primary care doctor and has not yet sorted out the cause of her bleeding.  He notes that she was recently hospitalized for CVA I am prescribed Plavix and aspirin.   [EW]  1816 Discussed  with gastroenterology, Dr. Laurence Spates.  He will evaluate  the patient as a Optometrist, likely tomorrow morning.   [EW]    Clinical Course User Index [EW] Daleen Bo, MD        Patient Vitals for the past 24 hrs:  BP Temp Temp src Pulse Resp SpO2  07/10/18 1809 (!) 158/61 97.8 F (36.6 C) Oral 66 18 100 %  07/10/18 1545 (!) 124/52 - - 68 15 100 %  07/10/18 1530 (!) 125/46 - - (!) 58 (!) 8 98 %  07/10/18 1348 121/68 98.2 F (36.8 C) Oral 78 12 100 %    6:17 PM Reevaluation with update and discussion. After initial assessment and treatment, an updated evaluation reveals no change in clinical status, findings discussed with the patient and all questions were answered. Daleen Bo   Medical Decision Making: Patient presents for evaluation of significant anemia found during routine testing.  She has a aphasia and is a poor historian.  She does not recall seeing blood in her stool.  She is unable to describe prior colonoscopy or GI evaluations.  Is a cardiac pacemaker.  It is unclear if she has had rapid blood loss or this is a slow need for the GI tract.  Initial evaluation and treatment includes blood transfusion for critically low hemoglobin, with concern for progression, since she is on aspirin and Plavix.  Require evaluation by neurology, and cardiology, regarding ongoing management of this difficult process with recent CVA and presence of cardiac pacemaker.  No signs or metabolic instability or suggestion for impending vascular collapse.   CRITICAL CARE-yes Performed by: Daleen Bo  Nursing Notes Reviewed/ Care Coordinated Applicable Imaging Reviewed Interpretation of Laboratory Data incorporated into ED treatment   5:02 PM-Consult complete with hospitalist. Patient case explained and discussed.  He agrees to admit patient for further evaluation and treatment. Call ended at [place]: Admit  Final Clinical Impressions(s) / ED Diagnoses   Final diagnoses:  Anemia, unspecified type  Rectal bleeding    ED Discharge  Orders    None       Daleen Bo, MD 07/10/18 1729    Daleen Bo, MD 07/10/18 [phone]

## 2018-07-10 NOTE — H&P (Signed)
[facility]                                                                                    Patient Demographics  [name], is a 73 y.o. female  CSN: [id number]  MRN: [id number]  DOB - 1945/05/26  Admit Date - 07/10/2018  Outpatient Primary MD for the patient is Shon Baton, MD   With History of -  Past Medical History:  Diagnosis Date  . Asthma   . Cancer ([place])   . Depression   . Hypertension   . Peripheral vascular disease ([place])   . Pneumonia   . Sleep apnea   . Stroke [facility])       Past Surgical History:  Procedure Laterality Date  . DIRECT LARYNGOSCOPY Left 06/29/2016   Procedure: WIDE EXCISION LEFT TONGUE CANCER DIRECT LARYNGOSCOPY;  Surgeon: [name], MD;  Location: [facility] OR;  Service: ENT;  Laterality: Left;  . ESOPHAGOSCOPY N/A 06/29/2016   Procedure: ESOPHAGOSCOPY/BRONCHOSCOPY;  Surgeon: [name], MD;  Location: [facility];  Service: ENT;  Laterality: N/A;  . EYE SURGERY Bilateral 2016  . KNEE SURGERY Right 2014  . LOOP RECORDER INSERTION N/A 03/21/2016   Procedure: Loop Recorder Insertion;  Surgeon: Will Meredith Leeds, MD;  Location: [place] CV LAB;  Service: Cardiovascular;  Laterality: N/A;  . PARS PLANA VITRECTOMY Right 02/28/2014   Procedure: PARS PLANA VITRECTOMY 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 WITH TAP AND ANTIBIOTIC INJECTION;  Surgeon: Hurman Horn, MD;  Location: [place];  Service: Ophthalmology;  Laterality: Right;  . TEE WITHOUT CARDIOVERSION N/A 03/21/2016   Procedure: TRANSESOPHAGEAL ECHOCARDIOGRAM (TEE);  Surgeon: Skeet Latch, MD;  Location: [facility] ENDOSCOPY;  Service: Cardiovascular;  Laterality: N/A;    in for   Chief Complaint  Patient presents with  . Abnormal Lab     HPI  [name]  is a 73 y.o. female, past medical history significant for cryptogenic PCA stroke, with resultant aphasia hypertension squamous cell carcinoma of the tongue, and hyperlipidemia who was sent to the emergency room today for  evaluation after her hemoglobin was noticed to be low on outpatient testing.  Patient denies any chest pains shortness of breath nausea vomiting diarrhea denies any headache.  Denies any blood in her stools or urine Patient had colonoscopy 3 years ago which was negative by Dr. Cristina Gong from [place]    In addition to the HPI above,  No Fever-chills, No Headache, No changes with Vision or hearing, No problems swallowing food or Liquids, No Chest pain, Cough or Shortness of Breath, No Abdominal pain, No Nausea or Vommitting, Bowel movements are regular, No Blood in stool or Urine, No dysuria, No new skin rashes or bruises, No new joints pains-aches,  No new weakness, tingling, numbness in any extremity, No recent weight gain or loss, No polyuria, polydypsia or polyphagia, No significant Mental Stressors.  .   Social History Social History   Tobacco Use  . Smoking status: Former Smoker    Quit date: 05/29/2016    Years since quitting: 2.1  . Smokeless tobacco: Never Used  Substance Use Topics  . Alcohol use: No  Family History Family History  Problem Relation Age of Onset  . Cancer Mother        colon  . Stroke Father   . Heart disease Brother      Prior to Admission medications   Medication Sig Start Date End Date Taking? Authorizing Provider  acetaminophen (TYLENOL) 325 MG tablet Take 2 tablets (650 mg total) by mouth every 6 (six) hours as needed for mild pain or headache (or temp > 37.5 C (99.5 F)). 05/09/18   Barton Dubois, MD  albuterol (PROVENTIL) (2.5 MG/3ML) 0.083% nebulizer solution Take 2.5 mg by nebulization every 6 (six) hours as needed for wheezing or shortness of breath.    [provider]  amLODipine (NORVASC) 10 MG tablet Take 1 tablet (10 mg total) by mouth daily. 05/09/18   Barton Dubois, MD  aspirin 81 MG tablet Take 1 tablet (81 mg total) by mouth daily. 11/08/16   Ward Givens, NP  atorvastatin (LIPITOR) 10 MG  tablet Take 10 mg by mouth daily.  03/16/16   [provider]  azelastine (ASTELIN) 0.1 % nasal spray Place 1 spray into both nostrils 2 (two) times daily as needed for allergies. Use in each nostril as directed    [provider]  budesonide-formoterol (SYMBICORT) 80-4.5 MCG/ACT inhaler Inhale 2 puffs into the lungs 2 (two) times daily as needed (FOR RESPIRATORY ISSUES.).    [provider]  carboxymethylcellulose (REFRESH PLUS) 0.5 % SOLN Place 1 drop into both eyes daily as needed (dry eyes).    [provider]  Cholecalciferol (VITAMIN D) 2000 units CAPS Take 2,000 Units by mouth daily.    [provider]  clopidogrel (PLAVIX) 75 MG tablet Take 1 tablet (75 mg total) by mouth daily. 05/10/18   Barton Dubois, MD  escitalopram (LEXAPRO) 20 MG tablet Take 20 mg by mouth daily.  07/10/14   [provider]  losartan (COZAAR) 25 MG tablet Take 1 tablet (25 mg total) by mouth daily. 05/10/18   Barton Dubois, MD  nicotine (NICODERM CQ - DOSED IN MG/24 HOURS) 21 mg/24hr patch Place 1 patch (21 mg total) onto the skin daily at 8 pm. 05/09/18   Barton Dubois, MD    Allergies  Allergen Reactions  . Codeine Itching    Mouth broke out  . Neosporin [Neomycin-Bacitracin Zn-Polymyx] Itching and Swelling    swelling at application site    Physical Exam  Vitals  Blood pressure (!) 124/52, pulse 68, temperature 98.2 F (36.8 C), temperature source Oral, resp. rate 15, SpO2 100 %.   General appearance well-developed, well-nourished in no acute distress HEENT no jaundice mild pallor, no facial deviation Neck supple, no neck vein distention Chest clear and resonant  Heart normal S1-S2 Abdomen soft nontender bowel sounds present Extremities no clubbing cyanosis or edema Neuro grossly nonfocal patient moving all extremities Skin no rashes or ulcers   Data Review  CBC Recent Labs  Lab 07/10/18 1355  WBC 5.9  HGB 6.1*  HCT 20.5*  PLT 345   MCV 93.2  MCH 27.7  MCHC 29.8*  RDW 14.6  LYMPHSABS 1.7  MONOABS 0.6  EOSABS 0.1  BASOSABS 0.1   ------------------------------------------------------------------------------------------------------------------  Chemistries  Recent Labs  Lab 07/10/18 1355  NA 136  K 4.2  CL 104  CO2 24  GLUCOSE 110*  BUN 21  CREATININE 1.00  CALCIUM 8.8*  AST 21  ALT 15  ALKPHOS 65  BILITOT 0.2*   ------------------------------------------------------------------------------------------------------------------ CrCl cannot be calculated (Unknown ideal weight.). ------------------------------------------------------------------------------------------------------------------  No results for input(s): TSH, T4TOTAL, T3FREE, THYROIDAB in the last 72 hours.  Invalid input(s): FREET3   Coagulation profile No results for input(s): INR, PROTIME in the last 168 hours. ------------------------------------------------------------------------------------------------------------------- No results for input(s): DDIMER in the last 72 hours. -------------------------------------------------------------------------------------------------------------------  Cardiac Enzymes No results for input(s): CKMB, TROPONINI, MYOGLOBIN in the last 168 hours.  Invalid input(s): CK ------------------------------------------------------------------------------------------------------------------ Invalid input(s): POCBNP   ---------------------------------------------------------------------------------------------------------------  Urinalysis    Component Value Date/Time   COLORURINE STRAW (A) 03/18/2016 1328   APPEARANCEUR CLEAR 03/18/2016 1328   LABSPEC 1.003 (L) 03/18/2016 1328   PHURINE 5.0 03/18/2016 1328   GLUCOSEU NEGATIVE 03/18/2016 1328   HGBUR NEGATIVE 03/18/2016 1328   BILIRUBINUR NEGATIVE 03/18/2016 1328   KETONESUR NEGATIVE 03/18/2016 1328   PROTEINUR NEGATIVE 03/18/2016 1328   NITRITE  NEGATIVE 03/18/2016 1328   LEUKOCYTESUR NEGATIVE 03/18/2016 1328    ----------------------------------------------------------------------------------------------------------------   Imaging results:   No results found.  Personally reviewed Old Chart from 4/20  Assessment & Plan   GI bleed, patient on aspirin and Plavix for CVA Will be placed on hold Protonix IV Blood transfusion GI consult  Thrombotic CVA/PCA territory on aspirin that will be held due to GI bleed  Hypertension Norvasc and Cozaar on hold due to soft blood pressure  Tongue cancer Status post surgery in the past  DVT Prophylaxis SCDs  AM Labs Ordered, also please review Full Orders    Code Status full  Disposition Plan: Home  Time spent in minutes : 39 minutes Condition GUARDED   @SIGNATURE @

## 2018-07-10 NOTE — ED Notes (Signed)
Blood consent signed by pt

## 2018-07-10 NOTE — ED Notes (Signed)
Pt ambulatory to bathroom without any problems 

## 2018-07-10 NOTE — ED Notes (Signed)
Per [facility], pt's hemoglobin dropped from 13 to 6

## 2018-07-10 NOTE — ED Triage Notes (Signed)
Pt went to get physical and was called and told to go to ED for "low blood". Reports feels midly dizzy and legs hurt. Denies SOB or fatigue or chest pain.

## 2018-07-10 NOTE — ED Notes (Signed)
Pt denies any abd pain, denies seeing any blood in stool

## 2018-07-11 ENCOUNTER — Other Ambulatory Visit: Payer: Self-pay

## 2018-07-11 DIAGNOSIS — J42 Unspecified chronic bronchitis: Secondary | ICD-10-CM

## 2018-07-11 DIAGNOSIS — K921 Melena: Secondary | ICD-10-CM

## 2018-07-11 DIAGNOSIS — D649 Anemia, unspecified: Secondary | ICD-10-CM

## 2018-07-11 DIAGNOSIS — I10 Essential (primary) hypertension: Secondary | ICD-10-CM

## 2018-07-11 LAB — CBC
HCT: 25.5 % — ABNORMAL LOW (ref 36.0–46.0)
Hemoglobin: 8 g/dL — ABNORMAL LOW (ref 12.0–15.0)
MCH: 27.9 pg (ref 26.0–34.0)
MCHC: 31.4 g/dL (ref 30.0–36.0)
MCV: 88.9 fL (ref 80.0–100.0)
Platelets: 326 10*3/uL (ref 150–400)
RBC: 2.87 MIL/uL — ABNORMAL LOW (ref 3.87–5.11)
RDW: 14.9 % (ref 11.5–15.5)
WBC: 5.5 10*3/uL (ref 4.0–10.5)
nRBC: 0 % (ref 0.0–0.2)

## 2018-07-11 LAB — SARS CORONAVIRUS 2 BY RT PCR (HOSPITAL ORDER, PERFORMED IN ~~LOC~~ HOSPITAL LAB): SARS Coronavirus 2: NEGATIVE

## 2018-07-11 MED ORDER — AZELASTINE HCL 0.1 % NA SOLN
1.0000 | Freq: Two times a day (BID) | NASAL | Status: DC | PRN
  Filled 2018-07-11: qty 30

## 2018-07-11 MED ORDER — PANTOPRAZOLE SODIUM 40 MG PO TBEC
40.0000 mg | DELAYED_RELEASE_TABLET | Freq: Two times a day (BID) | ORAL | Status: DC
  Administered 2018-07-11 – 2018-07-13 (×5): 40 mg via ORAL
  Filled 2018-07-11 (×5): qty 1

## 2018-07-11 MED ORDER — SODIUM CHLORIDE 0.9% FLUSH: 3.0000 mL | INTRAVENOUS | Status: DC | PRN

## 2018-07-11 MED ORDER — ONDANSETRON HCL 4 MG/2ML IJ SOLN: 4.0000 mg | Freq: Four times a day (QID) | INTRAMUSCULAR | Status: DC | PRN

## 2018-07-11 MED ORDER — SODIUM CHLORIDE 0.9 % IV SOLN: 250.0000 mL | INTRAVENOUS | Status: DC | PRN

## 2018-07-11 MED ORDER — SODIUM CHLORIDE 0.9% FLUSH
3.0000 mL | Freq: Two times a day (BID) | INTRAVENOUS | Status: DC
  Administered 2018-07-11 – 2018-07-13 (×5): 3 mL via INTRAVENOUS

## 2018-07-11 MED ORDER — ATORVASTATIN CALCIUM 10 MG PO TABS
10.0000 mg | ORAL_TABLET | Freq: Every day | ORAL | Status: DC
  Administered 2018-07-11 – 2018-07-13 (×3): 10 mg via ORAL
  Filled 2018-07-11 (×3): qty 1

## 2018-07-11 MED ORDER — ONDANSETRON HCL 4 MG PO TABS: 4.0000 mg | ORAL_TABLET | Freq: Four times a day (QID) | ORAL | Status: DC | PRN

## 2018-07-11 MED ORDER — ACETAMINOPHEN 325 MG PO TABS: 650.0000 mg | ORAL_TABLET | Freq: Four times a day (QID) | ORAL | Status: DC | PRN

## 2018-07-11 MED ORDER — ESCITALOPRAM OXALATE 20 MG PO TABS
20.0000 mg | ORAL_TABLET | Freq: Every day | ORAL | Status: DC
  Administered 2018-07-11 – 2018-07-13 (×3): 20 mg via ORAL
  Filled 2018-07-11 (×3): qty 1

## 2018-07-11 MED ORDER — VITAMIN D 25 MCG (1000 UNIT) PO TABS
2000.0000 [IU] | ORAL_TABLET | Freq: Every day | ORAL | Status: DC
  Administered 2018-07-11: 09:00:00 2000 [IU] via ORAL
  Administered 2018-07-12: 200 [IU] via ORAL
  Administered 2018-07-13: 2000 [IU] via ORAL
  Filled 2018-07-11 (×3): qty 2

## 2018-07-11 MED ORDER — ALBUTEROL SULFATE (2.5 MG/3ML) 0.083% IN NEBU: 2.5000 mg | INHALATION_SOLUTION | Freq: Four times a day (QID) | RESPIRATORY_TRACT | Status: DC | PRN

## 2018-07-11 MED ORDER — MOMETASONE FURO-FORMOTEROL FUM 100-5 MCG/ACT IN AERO
2.0000 | INHALATION_SPRAY | Freq: Two times a day (BID) | RESPIRATORY_TRACT | Status: DC
  Administered 2018-07-11 – 2018-07-13 (×4): 2 via RESPIRATORY_TRACT
  Filled 2018-07-11: qty 8.8

## 2018-07-11 NOTE — Progress Notes (Signed)
PROGRESS NOTE  [name] UUV:[id number] DOB: 1945/11/15 DOA: 07/10/2018 PCP: Shon Baton, MD   LOS: 1 day   Brief Narrative / Interim history: 73 year old female with history of cryptogenic PCA stroke with resultant aphasia couple of months ago, hypertension, squamous cell carcinoma of the tongue, hyperlipidemia, who was recently hospitalized for the stroke and was discharged to a rehab and has been home for the past couple of weeks came to the hospital because on routine blood work she was found to have a hemoglobin in the 6 range.  She was also found to be fecal occult positive in the ED and she was admitted and GI was consulted.  Patient today denies any blood in her stools, does report black stools but that has been the case since she has been on iron.  She is being followed by [facility] and most recent colonoscopy was 3 years ago by Dr. Cristina Gong  Subjective: Doing well this morning, denies any chest pain, denies any shortness of breath, no abdominal pain, no nausea or vomiting.  Assessment & Plan: Active Problems:   GI bleed   Principal Problem Acute blood loss anemia -Patient meeting discharge in April 2020 with a cryptogenic CVA, neurology was consulted and recommended at that time dual antiplatelet therapy with aspirin and Plavix for 3 months and subsequently just use of Plavix -These are now on hold, GI consulted and plans for EGD tomorrow pending negative coronavirus.  If negative by the end of the day today we will do a rapid in-house testing  Active Problems Recent CVA -Hold antiplatelets today, pending EGD may need to discuss with neurology  Tongue cancer -Status post surgery in the past  Depression -Continue Lexapro  COPD -Stable, no wheezing.  Just quit smoking 2 months ago and has remained abstinent  Hypertension -Hold Cozaar, amlodipine, blood pressure stable  Hyperlipidemia -Continue statin  Scheduled Meds: . atorvastatin  10 mg Oral Daily  .  cholecalciferol  2,000 Units Oral Daily  . escitalopram  20 mg Oral Daily  . mometasone-formoterol  2 puff Inhalation BID  . pantoprazole  40 mg Oral BID AC  . sodium chloride flush  3 mL Intravenous Q12H   Continuous Infusions: . sodium chloride     PRN Meds:.sodium chloride, acetaminophen, albuterol, azelastine, ondansetron **OR** ondansetron (ZOFRAN) IV, sodium chloride flush  DVT prophylaxis: SCDs Code Status: Full code Family Communication: d/w patient  Disposition Plan: home when ready   Consultants:   GI  Procedures:   None   Antimicrobials:  None    Objective: Vitals:   07/10/18 1828 07/11/18 0016 07/11/18 0617 07/11/18 0939  BP: (!) 137/58 (!) 134/59 130/62   Pulse: 74 69 70   Resp: 20 16 16    Temp: 97.8 F (36.6 C) 97.8 F (36.6 C) 98.1 F (36.7 C)   TempSrc: Oral  Oral   SpO2: 100% 97% 100% 97%    Intake/Output Summary (Last 24 hours) at 07/11/2018 1340 Last data filed at 07/11/2018 1019 Gross per 24 hour  Intake 326 ml  Output -  Net 326 ml   There were no vitals filed for this visit.  Examination:  Constitutional: NAD Eyes: PERRL, lids and conjunctivae normal ENMT: Mucous membranes are moist.  Respiratory: clear to auscultation bilaterally, no wheezing, no crackles. Normal respiratory effort.  Cardiovascular: Regular rate and rhythm, no murmurs / rubs / gallops. No LE edema. 2+ pedal pulses. Abdomen: no tenderness. Bowel sounds positive.  Musculoskeletal: no clubbing / cyanosis. Skin: no rashes  Neurologic: CN 2-12 grossly intact. Strength 5/5 in all 4.  Psychiatric: Normal judgment and insight.   Data Reviewed: I have independently reviewed following labs and imaging studies   CBC: Recent Labs  Lab 07/10/18 1355 07/11/18 0315  WBC 5.9 5.5  NEUTROABS 3.4  --   HGB 6.1* 8.0*  HCT 20.5* 25.5*  MCV 93.2 88.9  PLT 345 696   Basic Metabolic Panel: Recent Labs  Lab 07/10/18 1355  NA 136  K 4.2  CL 104  CO2 24  GLUCOSE 110*   BUN 21  CREATININE 1.00  CALCIUM 8.8*   GFR: CrCl cannot be calculated (Unknown ideal weight.). Liver Function Tests: Recent Labs  Lab 07/10/18 1355  AST 21  ALT 15  ALKPHOS 65  BILITOT 0.2*  PROT 5.9*  ALBUMIN 3.4*   No results for input(s): LIPASE, AMYLASE in the last 168 hours. No results for input(s): AMMONIA in the last 168 hours. Coagulation Profile: No results for input(s): INR, PROTIME in the last 168 hours. Cardiac Enzymes: No results for input(s): CKTOTAL, CKMB, CKMBINDEX, TROPONINI in the last 168 hours. BNP (last 3 results) No results for input(s): PROBNP in the last 8760 hours. HbA1C: No results for input(s): HGBA1C in the last 72 hours. CBG: No results for input(s): GLUCAP in the last 168 hours. Lipid Profile: No results for input(s): CHOL, HDL, LDLCALC, TRIG, CHOLHDL, LDLDIRECT in the last 72 hours. Thyroid Function Tests: No results for input(s): TSH, T4TOTAL, FREET4, T3FREE, THYROIDAB in the last 72 hours. Anemia Panel: No results for input(s): VITAMINB12, FOLATE, FERRITIN, TIBC, IRON, RETICCTPCT in the last 72 hours. Urine analysis:    Component Value Date/Time   COLORURINE STRAW (A) 03/18/2016 1328   APPEARANCEUR CLEAR 03/18/2016 1328   LABSPEC 1.003 (L) 03/18/2016 1328   PHURINE 5.0 03/18/2016 1328   GLUCOSEU NEGATIVE 03/18/2016 1328   HGBUR NEGATIVE 03/18/2016 1328   BILIRUBINUR NEGATIVE 03/18/2016 1328   KETONESUR NEGATIVE 03/18/2016 1328   PROTEINUR NEGATIVE 03/18/2016 1328   NITRITE NEGATIVE 03/18/2016 1328   LEUKOCYTESUR NEGATIVE 03/18/2016 1328   Sepsis Labs: Invalid input(s): PROCALCITONIN, LACTICIDVEN  No results found for this or any previous visit (from the past 240 hour(s)).    Radiology Studies: No results found.   Marzetta Board, MD, PhD [facility]  Contact via  [email or web address]  [place] P: [phone]  F: [phone]

## 2018-07-11 NOTE — Consult Note (Signed)
[facility] Gastroenterology Consultation Note  Referring Provider: Dr. Marzetta Board [facility]) Primary Care Physician:  Shon Baton, MD Primary Gastroenterologist:  Dr. Cristina Gong  Reason for Consultation:  Anemia, hemoccult positive stool  HPI: [name] is a 73 y.o. female admitted for anemia and hemoccult positive stools.  Over the past few months, some very mild weakness and lethargy.  Multiple strokes, last 2-3 months ago, on aspirin and clopidigrel.  No abdominal pain, nausea, vomiting, dysphagia, hematemesis, melena, hematochezia, change in bowel habits, unintentional weight loss.  Denies prior endoscopy.  Last colonoscopy reportedly negative 3 years ago by Dr. Cristina Gong.   Past Medical History:  Diagnosis Date  . Asthma   . Cancer ([place])   . Depression   . Hypertension   . Peripheral vascular disease ([place])   . Pneumonia   . Sleep apnea   . Stroke [facility])     Past Surgical History:  Procedure Laterality Date  . DIRECT LARYNGOSCOPY Left 06/29/2016   Procedure: WIDE EXCISION LEFT TONGUE CANCER DIRECT LARYNGOSCOPY;  Surgeon: [name], MD;  Location: [facility] OR;  Service: ENT;  Laterality: Left;  . ESOPHAGOSCOPY N/A 06/29/2016   Procedure: ESOPHAGOSCOPY/BRONCHOSCOPY;  Surgeon: [name], MD;  Location: [place];  Service: ENT;  Laterality: N/A;  . EYE SURGERY Bilateral 2016  . KNEE SURGERY Right 2014  . LOOP RECORDER INSERTION N/A 03/21/2016   Procedure: Loop Recorder Insertion;  Surgeon: Will Meredith Leeds, MD;  Location: [place] CV LAB;  Service: Cardiovascular;  Laterality: N/A;  . PARS PLANA VITRECTOMY Right 02/28/2014   Procedure: PARS PLANA VITRECTOMY 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 WITH TAP AND ANTIBIOTIC INJECTION;  Surgeon: Hurman Horn, MD;  Location: [place];  Service: Ophthalmology;  Laterality: Right;  . TEE WITHOUT CARDIOVERSION N/A 03/21/2016   Procedure: TRANSESOPHAGEAL ECHOCARDIOGRAM (TEE);  Surgeon: Skeet Latch, MD;  Location: [facility] ENDOSCOPY;  Service:  Cardiovascular;  Laterality: N/A;    Prior to Admission medications   Medication Sig Start Date End Date Taking? Authorizing Provider  acetaminophen (TYLENOL) 325 MG tablet Take 2 tablets (650 mg total) by mouth every 6 (six) hours as needed for mild pain or headache (or temp > 37.5 C (99.5 F)). 05/09/18  Yes Barton Dubois, MD  Albuterol Sulfate 108 (90 Base) MCG/ACT AEPB Inhale 2 puffs into the lungs every 6 (six) hours as needed for shortness of breath. 04/07/18  Yes [provider]  amLODipine (NORVASC) 10 MG tablet Take 1 tablet (10 mg total) by mouth daily. 05/09/18  Yes Barton Dubois, MD  aspirin 81 MG tablet Take 1 tablet (81 mg total) by mouth daily. 11/08/16  Yes Ward Givens, NP  atorvastatin (LIPITOR) 10 MG tablet Take 10 mg by mouth daily.  03/16/16  Yes [provider]  azelastine (ASTELIN) 0.1 % nasal spray Place 1 spray into both nostrils 2 (two) times daily as needed for allergies. Use in each nostril as directed   Yes [provider]  carboxymethylcellulose (REFRESH PLUS) 0.5 % SOLN Place 1 drop into both eyes 3 (three) times daily as needed (for dryness).    Yes [provider]  Cholecalciferol (VITAMIN D-3) 25 MCG (1000 UT) CAPS Take 1,000 Units by mouth daily.   Yes [provider]  clopidogrel (PLAVIX) 75 MG tablet Take 1 tablet (75 mg total) by mouth daily. 05/10/18  Yes Barton Dubois, MD  escitalopram (LEXAPRO) 20 MG tablet Take 20 mg by mouth daily.  07/10/14  Yes [provider]  ferrous sulfate 325 (65 FE) MG  tablet Take 325 mg by mouth every Monday.   Yes [provider]  losartan (COZAAR) 25 MG tablet Take 1 tablet (25 mg total) by mouth daily. 05/10/18  Yes Barton Dubois, MD  vitamin B-12 (CYANOCOBALAMIN) 1000 MCG tablet Take 1,000 mcg by mouth daily.   Yes [provider]  albuterol (PROVENTIL) (2.5 MG/3ML) 0.083% nebulizer solution Take 2.5 mg by nebulization every 6 (six) hours as needed for  wheezing or shortness of breath.    [provider]  budesonide-formoterol (SYMBICORT) 80-4.5 MCG/ACT inhaler Inhale 2 puffs into the lungs 2 (two) times daily as needed (FOR RESPIRATORY ISSUES.).    [provider]  nicotine (NICODERM CQ - DOSED IN MG/24 HOURS) 21 mg/24hr patch Place 1 patch (21 mg total) onto the skin daily at 8 pm. Patient not taking: Reported on 07/10/2018 05/09/18   Barton Dubois, MD    Current Facility-Administered Medications  Medication Dose Route Frequency Provider Last Rate Last Dose  . 0.9 %  sodium chloride infusion  250 mL Intravenous PRN Merton Border, MD      . acetaminophen (TYLENOL) tablet 650 mg  650 mg Oral Q6H PRN Merton Border, MD      . albuterol (PROVENTIL) (2.5 MG/3ML) 0.083% nebulizer solution 2.5 mg  2.5 mg Nebulization Q6H PRN Merton Border, MD      . atorvastatin (LIPITOR) tablet 10 mg  10 mg Oral Daily Merton Border, MD   10 mg at 07/11/18 0904  . azelastine (ASTELIN) 0.1 % nasal spray 1 spray  1 spray Each Nare BID PRN Merton Border, MD      . cholecalciferol (VITAMIN D3) tablet 2,000 Units  2,000 Units Oral Daily Merton Border, MD   2,000 Units at 07/11/18 0904  . escitalopram (LEXAPRO) tablet 20 mg  20 mg Oral Daily Merton Border, MD   20 mg at 07/11/18 0904  . mometasone-formoterol (DULERA) 100-5 MCG/ACT inhaler 2 puff  2 puff Inhalation BID Merton Border, MD   2 puff at 07/11/18 [phone]  . ondansetron (ZOFRAN) tablet 4 mg  4 mg Oral Q6H PRN Merton Border, MD       Or  . ondansetron (ZOFRAN) injection 4 mg  4 mg Intravenous Q6H PRN Merton Border, MD      . sodium chloride flush (NS) 0.9 % injection 3 mL  3 mL Intravenous Q12H Merton Border, MD   3 mL at 07/11/18 0906  . sodium chloride flush (NS) 0.9 % injection 3 mL  3 mL Intravenous PRN Merton Border, MD        Allergies as of 07/10/2018 - Review Complete 07/10/2018  Allergen Reaction Noted  . Codeine Itching and Other (See Comments) 05/19/2011  . Neosporin [neomycin-bacitracin zn-polymyx] Itching  and Swelling 03/21/2016    Family History  Problem Relation Age of Onset  . Cancer Mother        colon  . Stroke Father   . Heart disease Brother     Social History   Socioeconomic History  . Marital status: Single    Spouse name: Not on file  . Number of children: Not on file  . Years of education: Not on file  . Highest education level: Not on file  Occupational History  . Not on file  Social Needs  . Financial resource strain: Not on file  . Food insecurity    Worry: Not on file    Inability: Not on file  . Transportation needs    Medical: Not on file  Non-medical: Not on file  Tobacco Use  . Smoking status: Former Smoker    Quit date: 05/29/2016    Years since quitting: 2.1  . Smokeless tobacco: Never Used  Substance and Sexual Activity  . Alcohol use: No  . Drug use: No  . Sexual activity: Not on file  Lifestyle  . Physical activity    Days per week: Not on file    Minutes per session: Not on file  . Stress: Not on file  Relationships  . Social Herbalist on phone: Not on file    Gets together: Not on file    Attends religious service: Not on file    Active member of club or organization: Not on file    Attends meetings of clubs or organizations: Not on file    Relationship status: Not on file  . Intimate partner violence    Fear of current or ex partner: Not on file    Emotionally abused: Not on file    Physically abused: Not on file    Forced sexual activity: Not on file  Other Topics Concern  . Not on file  Social History Narrative  . Not on file    Review of Systems: As per HPI all others negative  Physical Exam: Vital signs in last 24 hours: Temp:  [97.8 F (36.6 C)-98.2 F (36.8 C)] 98.1 F (36.7 C) (06/24 0617) Pulse Rate:  [58-78] 70 (06/24 0617) Resp:  [8-25] 16 (06/24 0617) BP: (112-158)/(39-68) 130/62 (06/24 0617) SpO2:  [89 %-100 %] 97 % (06/24 0939) Last BM Date: 07/10/18 General:   Alert,  Well-developed,  well-nourished, pleasant and cooperative in NAD Head:  Normocephalic and atraumatic. Eyes:  Sclera clear, no icterus.   Conjunctiva pink. Ears:  Normal auditory acuity. Nose:  No deformity, discharge,  or lesions. Mouth:  No deformity or lesions.  Oropharynx pink & moist. Neck:  Supple; no masses or thyromegaly. Lungs:  Clear throughout to auscultation.   No wheezes, crackles, or rhonchi. No acute distress. Heart:  Regular rate and rhythm; no murmurs, clicks, rubs,  or gallops. Abdomen:  Soft, nontender and nondistended. No masses, hepatosplenomegaly or hernias noted. Normal bowel sounds, without guarding, and without rebound.     Msk:  Symmetrical without gross deformities. Normal posture. Pulses:  Normal pulses noted. Extremities:  Without clubbing or edema. Neurologic:  Alert and  oriented x4;  Mild expressive aphasia, otherwise grossly normal neurologically. Skin:  Intact without significant lesions or rashes. Psych:  Alert and cooperative. Normal mood and affect.   Lab Results: Recent Labs    07/10/18 1355 07/11/18 0315  WBC 5.9 5.5  HGB 6.1* 8.0*  HCT 20.5* 25.5*  PLT 345 326   BMET Recent Labs    07/10/18 1355  NA 136  K 4.2  CL 104  CO2 24  GLUCOSE 110*  BUN 21  CREATININE 1.00  CALCIUM 8.8*   LFT Recent Labs    07/10/18 1355  PROT 5.9*  ALBUMIN 3.4*  AST 21  ALT 15  ALKPHOS 65  BILITOT 0.2*   PT/INR No results for input(s): LABPROT, INR in the last 72 hours.  Studies/Results: No results found.  Impression:  1.  Anemia. 2.  Hemoccult positive stools. No overt GI bleeding.  Colonoscopy reportedly unrevealing 3 years ago. 3.  Recurrent stroke, on aspirin and plavix.  Last stroke couple months ago.  Plan:  1.  Plavix on hold. 2.  PPI. 3.  Serial CBCs, transfuse  as needed 4.  Soft diet today, NPO after midnight. 5.  EGD tentatively for tomorrow, if coronavirus test is back (discussed potential delay of endoscopy if coronavirus test is not back  by time of her procedure tomorrow).  If endoscopy negative, consider capsule endoscopy (inpatient versus outpatient). 6.  Risks (bleeding, infection, bowel perforation that could require surgery, sedation-related changes in cardiopulmonary systems), benefits (identification and possible treatment of source of symptoms, exclusion of certain causes of symptoms), and alternatives (watchful waiting, radiographic imaging studies, empiric medical treatment) of upper endoscopy (EGD) were explained to patient/family in detail and patient wishes to proceed. 7.  [facility] will follow.   LOS: 1 day   Henya Aguallo M  07/11/2018, 11:05 AM  Cell [phone] If no answer or after 5 PM call [phone]

## 2018-07-11 NOTE — H&P (View-Only) (Signed)
[facility] Gastroenterology Consultation Note  Referring Provider: Dr. Marzetta Board [facility]) Primary Care Physician:  Shon Baton, MD Primary Gastroenterologist:  Dr. Cristina Gong  Reason for Consultation:  Anemia, hemoccult positive stool  HPI: [name] is a 73 y.o. female admitted for anemia and hemoccult positive stools.  Over the past few months, some very mild weakness and lethargy.  Multiple strokes, last 2-3 months ago, on aspirin and clopidigrel.  No abdominal pain, nausea, vomiting, dysphagia, hematemesis, melena, hematochezia, change in bowel habits, unintentional weight loss.  Denies prior endoscopy.  Last colonoscopy reportedly negative 3 years ago by Dr. Cristina Gong.   Past Medical History:  Diagnosis Date  . Asthma   . Cancer ([place])   . Depression   . Hypertension   . Peripheral vascular disease ([place])   . Pneumonia   . Sleep apnea   . Stroke [facility])     Past Surgical History:  Procedure Laterality Date  . DIRECT LARYNGOSCOPY Left 06/29/2016   Procedure: WIDE EXCISION LEFT TONGUE CANCER DIRECT LARYNGOSCOPY;  Surgeon: [name], MD;  Location: [facility] OR;  Service: ENT;  Laterality: Left;  . ESOPHAGOSCOPY N/A 06/29/2016   Procedure: ESOPHAGOSCOPY/BRONCHOSCOPY;  Surgeon: [name], MD;  Location: [place];  Service: ENT;  Laterality: N/A;  . EYE SURGERY Bilateral 2016  . KNEE SURGERY Right 2014  . LOOP RECORDER INSERTION N/A 03/21/2016   Procedure: Loop Recorder Insertion;  Surgeon: Will Meredith Leeds, MD;  Location: [place] CV LAB;  Service: Cardiovascular;  Laterality: N/A;  . PARS PLANA VITRECTOMY Right 02/28/2014   Procedure: PARS PLANA VITRECTOMY 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 WITH TAP AND ANTIBIOTIC INJECTION;  Surgeon: Hurman Horn, MD;  Location: [place];  Service: Ophthalmology;  Laterality: Right;  . TEE WITHOUT CARDIOVERSION N/A 03/21/2016   Procedure: TRANSESOPHAGEAL ECHOCARDIOGRAM (TEE);  Surgeon: Skeet Latch, MD;  Location: [facility] ENDOSCOPY;  Service:  Cardiovascular;  Laterality: N/A;    Prior to Admission medications   Medication Sig Start Date End Date Taking? Authorizing Provider  acetaminophen (TYLENOL) 325 MG tablet Take 2 tablets (650 mg total) by mouth every 6 (six) hours as needed for mild pain or headache (or temp > 37.5 C (99.5 F)). 05/09/18  Yes Barton Dubois, MD  Albuterol Sulfate 108 (90 Base) MCG/ACT AEPB Inhale 2 puffs into the lungs every 6 (six) hours as needed for shortness of breath. 04/07/18  Yes [provider]  amLODipine (NORVASC) 10 MG tablet Take 1 tablet (10 mg total) by mouth daily. 05/09/18  Yes Barton Dubois, MD  aspirin 81 MG tablet Take 1 tablet (81 mg total) by mouth daily. 11/08/16  Yes Ward Givens, NP  atorvastatin (LIPITOR) 10 MG tablet Take 10 mg by mouth daily.  03/16/16  Yes [provider]  azelastine (ASTELIN) 0.1 % nasal spray Place 1 spray into both nostrils 2 (two) times daily as needed for allergies. Use in each nostril as directed   Yes [provider]  carboxymethylcellulose (REFRESH PLUS) 0.5 % SOLN Place 1 drop into both eyes 3 (three) times daily as needed (for dryness).    Yes [provider]  Cholecalciferol (VITAMIN D-3) 25 MCG (1000 UT) CAPS Take 1,000 Units by mouth daily.   Yes [provider]  clopidogrel (PLAVIX) 75 MG tablet Take 1 tablet (75 mg total) by mouth daily. 05/10/18  Yes Barton Dubois, MD  escitalopram (LEXAPRO) 20 MG tablet Take 20 mg by mouth daily.  07/10/14  Yes [provider]  ferrous sulfate 325 (65 FE) MG  tablet Take 325 mg by mouth every Monday.   Yes [provider]  losartan (COZAAR) 25 MG tablet Take 1 tablet (25 mg total) by mouth daily. 05/10/18  Yes Barton Dubois, MD  vitamin B-12 (CYANOCOBALAMIN) 1000 MCG tablet Take 1,000 mcg by mouth daily.   Yes [provider]  albuterol (PROVENTIL) (2.5 MG/3ML) 0.083% nebulizer solution Take 2.5 mg by nebulization every 6 (six) hours as needed for  wheezing or shortness of breath.    [provider]  budesonide-formoterol (SYMBICORT) 80-4.5 MCG/ACT inhaler Inhale 2 puffs into the lungs 2 (two) times daily as needed (FOR RESPIRATORY ISSUES.).    [provider]  nicotine (NICODERM CQ - DOSED IN MG/24 HOURS) 21 mg/24hr patch Place 1 patch (21 mg total) onto the skin daily at 8 pm. Patient not taking: Reported on 07/10/2018 05/09/18   Barton Dubois, MD    Current Facility-Administered Medications  Medication Dose Route Frequency Provider Last Rate Last Dose  . 0.9 %  sodium chloride infusion  250 mL Intravenous PRN Merton Border, MD      . acetaminophen (TYLENOL) tablet 650 mg  650 mg Oral Q6H PRN Merton Border, MD      . albuterol (PROVENTIL) (2.5 MG/3ML) 0.083% nebulizer solution 2.5 mg  2.5 mg Nebulization Q6H PRN Merton Border, MD      . atorvastatin (LIPITOR) tablet 10 mg  10 mg Oral Daily Merton Border, MD   10 mg at 07/11/18 0904  . azelastine (ASTELIN) 0.1 % nasal spray 1 spray  1 spray Each Nare BID PRN Merton Border, MD      . cholecalciferol (VITAMIN D3) tablet 2,000 Units  2,000 Units Oral Daily Merton Border, MD   2,000 Units at 07/11/18 0904  . escitalopram (LEXAPRO) tablet 20 mg  20 mg Oral Daily Merton Border, MD   20 mg at 07/11/18 0904  . mometasone-formoterol (DULERA) 100-5 MCG/ACT inhaler 2 puff  2 puff Inhalation BID Merton Border, MD   2 puff at 07/11/18 [phone]  . ondansetron (ZOFRAN) tablet 4 mg  4 mg Oral Q6H PRN Merton Border, MD       Or  . ondansetron (ZOFRAN) injection 4 mg  4 mg Intravenous Q6H PRN Merton Border, MD      . sodium chloride flush (NS) 0.9 % injection 3 mL  3 mL Intravenous Q12H Merton Border, MD   3 mL at 07/11/18 0906  . sodium chloride flush (NS) 0.9 % injection 3 mL  3 mL Intravenous PRN Merton Border, MD        Allergies as of 07/10/2018 - Review Complete 07/10/2018  Allergen Reaction Noted  . Codeine Itching and Other (See Comments) 05/19/2011  . Neosporin [neomycin-bacitracin zn-polymyx] Itching  and Swelling 03/21/2016    Family History  Problem Relation Age of Onset  . Cancer Mother        colon  . Stroke Father   . Heart disease Brother     Social History   Socioeconomic History  . Marital status: Single    Spouse name: Not on file  . Number of children: Not on file  . Years of education: Not on file  . Highest education level: Not on file  Occupational History  . Not on file  Social Needs  . Financial resource strain: Not on file  . Food insecurity    Worry: Not on file    Inability: Not on file  . Transportation needs    Medical: Not on file  Non-medical: Not on file  Tobacco Use  . Smoking status: Former Smoker    Quit date: 05/29/2016    Years since quitting: 2.1  . Smokeless tobacco: Never Used  Substance and Sexual Activity  . Alcohol use: No  . Drug use: No  . Sexual activity: Not on file  Lifestyle  . Physical activity    Days per week: Not on file    Minutes per session: Not on file  . Stress: Not on file  Relationships  . Social Herbalist on phone: Not on file    Gets together: Not on file    Attends religious service: Not on file    Active member of club or organization: Not on file    Attends meetings of clubs or organizations: Not on file    Relationship status: Not on file  . Intimate partner violence    Fear of current or ex partner: Not on file    Emotionally abused: Not on file    Physically abused: Not on file    Forced sexual activity: Not on file  Other Topics Concern  . Not on file  Social History Narrative  . Not on file    Review of Systems: As per HPI all others negative  Physical Exam: Vital signs in last 24 hours: Temp:  [97.8 F (36.6 C)-98.2 F (36.8 C)] 98.1 F (36.7 C) (06/24 0617) Pulse Rate:  [58-78] 70 (06/24 0617) Resp:  [8-25] 16 (06/24 0617) BP: (112-158)/(39-68) 130/62 (06/24 0617) SpO2:  [89 %-100 %] 97 % (06/24 0939) Last BM Date: 07/10/18 General:   Alert,  Well-developed,  well-nourished, pleasant and cooperative in NAD Head:  Normocephalic and atraumatic. Eyes:  Sclera clear, no icterus.   Conjunctiva pink. Ears:  Normal auditory acuity. Nose:  No deformity, discharge,  or lesions. Mouth:  No deformity or lesions.  Oropharynx pink & moist. Neck:  Supple; no masses or thyromegaly. Lungs:  Clear throughout to auscultation.   No wheezes, crackles, or rhonchi. No acute distress. Heart:  Regular rate and rhythm; no murmurs, clicks, rubs,  or gallops. Abdomen:  Soft, nontender and nondistended. No masses, hepatosplenomegaly or hernias noted. Normal bowel sounds, without guarding, and without rebound.     Msk:  Symmetrical without gross deformities. Normal posture. Pulses:  Normal pulses noted. Extremities:  Without clubbing or edema. Neurologic:  Alert and  oriented x4;  Mild expressive aphasia, otherwise grossly normal neurologically. Skin:  Intact without significant lesions or rashes. Psych:  Alert and cooperative. Normal mood and affect.   Lab Results: Recent Labs    07/10/18 1355 07/11/18 0315  WBC 5.9 5.5  HGB 6.1* 8.0*  HCT 20.5* 25.5*  PLT 345 326   BMET Recent Labs    07/10/18 1355  NA 136  K 4.2  CL 104  CO2 24  GLUCOSE 110*  BUN 21  CREATININE 1.00  CALCIUM 8.8*   LFT Recent Labs    07/10/18 1355  PROT 5.9*  ALBUMIN 3.4*  AST 21  ALT 15  ALKPHOS 65  BILITOT 0.2*   PT/INR No results for input(s): LABPROT, INR in the last 72 hours.  Studies/Results: No results found.  Impression:  1.  Anemia. 2.  Hemoccult positive stools. No overt GI bleeding.  Colonoscopy reportedly unrevealing 3 years ago. 3.  Recurrent stroke, on aspirin and plavix.  Last stroke couple months ago.  Plan:  1.  Plavix on hold. 2.  PPI. 3.  Serial CBCs, transfuse  as needed 4.  Soft diet today, NPO after midnight. 5.  EGD tentatively for tomorrow, if coronavirus test is back (discussed potential delay of endoscopy if coronavirus test is not back  by time of her procedure tomorrow).  If endoscopy negative, consider capsule endoscopy (inpatient versus outpatient). 6.  Risks (bleeding, infection, bowel perforation that could require surgery, sedation-related changes in cardiopulmonary systems), benefits (identification and possible treatment of source of symptoms, exclusion of certain causes of symptoms), and alternatives (watchful waiting, radiographic imaging studies, empiric medical treatment) of upper endoscopy (EGD) were explained to patient/family in detail and patient wishes to proceed. 7.  [facility] will follow.   LOS: 1 day   Garnell Phenix M  07/11/2018, 11:05 AM  Cell [phone] If no answer or after 5 PM call [phone]

## 2018-07-11 NOTE — Progress Notes (Signed)
Patient arrived to the unit via bed from the emergency department.  Patient is alert and oriented.  No complaints of pain. Skin assessment complete.  Bruises noted to the right lower extremity.  Placed the patient on telemetry.  Educated the patient on how to reach the staff on the unit. Lowered the bed, activated the bed alarm and placed the call light within reach.  Will continue to monitor patient and notify as needed.

## 2018-07-12 ENCOUNTER — Encounter (HOSPITAL_COMMUNITY): Payer: Self-pay | Admitting: Certified Registered Nurse Anesthetist

## 2018-07-12 ENCOUNTER — Encounter (HOSPITAL_COMMUNITY)
Admission: EM | Disposition: A | Discharge status: Home / Self Care | Payer: Self-pay | Source: Home / Self Care | Attending: Internal Medicine

## 2018-07-12 ENCOUNTER — Inpatient Hospital Stay (HOSPITAL_COMMUNITY): Payer: Medicare Other | Admitting: Certified Registered Nurse Anesthetist

## 2018-07-12 HISTORY — PX: ESOPHAGOGASTRODUODENOSCOPY (EGD) WITH PROPOFOL: SHX5813

## 2018-07-12 HISTORY — PX: HOT HEMOSTASIS: SHX5433

## 2018-07-12 LAB — BASIC METABOLIC PANEL
Anion gap: 7 (ref 5–15)
BUN: 23 mg/dL (ref 8–23)
CO2: 23 mmol/L (ref 22–32)
Calcium: 8.9 mg/dL (ref 8.9–10.3)
Chloride: 107 mmol/L (ref 98–111)
Creatinine, Ser: 1.14 mg/dL — ABNORMAL HIGH (ref 0.44–1.00)
GFR calc Af Amer: 55 mL/min — ABNORMAL LOW (ref >60)
GFR calc non Af Amer: 47 mL/min — ABNORMAL LOW (ref >60)
Glucose, Bld: 109 mg/dL — ABNORMAL HIGH (ref 70–99)
Potassium: 4.1 mmol/L (ref 3.5–5.1)
Sodium: 137 mmol/L (ref 135–145)

## 2018-07-12 LAB — CBC
HCT: 24.2 % — ABNORMAL LOW (ref 36.0–46.0)
Hemoglobin: 7.6 g/dL — ABNORMAL LOW (ref 12.0–15.0)
MCH: 27.7 pg (ref 26.0–34.0)
MCHC: 31.4 g/dL (ref 30.0–36.0)
MCV: 88.3 fL (ref 80.0–100.0)
Platelets: 324 10*3/uL (ref 150–400)
RBC: 2.74 MIL/uL — ABNORMAL LOW (ref 3.87–5.11)
RDW: 14.9 % (ref 11.5–15.5)
WBC: 6 10*3/uL (ref 4.0–10.5)
nRBC: 0 % (ref 0.0–0.2)

## 2018-07-12 LAB — NOVEL CORONAVIRUS, NAA (HOSP ORDER, SEND-OUT TO REF LAB; TAT 18-24 HRS): SARS-CoV-2, NAA: NOT DETECTED

## 2018-07-12 LAB — HEMOGLOBIN AND HEMATOCRIT, BLOOD
HCT: 25 % — ABNORMAL LOW (ref 36.0–46.0)
Hemoglobin: 7.6 g/dL — ABNORMAL LOW (ref 12.0–15.0)

## 2018-07-12 LAB — PREPARE RBC (CROSSMATCH)

## 2018-07-12 SURGERY — ESOPHAGOGASTRODUODENOSCOPY (EGD) WITH PROPOFOL
Anesthesia: Monitor Anesthesia Care

## 2018-07-12 MED ORDER — LIDOCAINE HCL (CARDIAC) PF 100 MG/5ML IV SOSY
PREFILLED_SYRINGE | INTRAVENOUS | Status: DC | PRN
  Administered 2018-07-12: 60 mg via INTRAVENOUS

## 2018-07-12 MED ORDER — LACTATED RINGERS IV SOLN
INTRAVENOUS | Status: DC
  Administered 2018-07-12: 09:00:00 via INTRAVENOUS

## 2018-07-12 MED ORDER — SODIUM CHLORIDE 0.9% IV SOLUTION: Freq: Once | INTRAVENOUS | Status: DC

## 2018-07-12 MED ORDER — PROPOFOL 500 MG/50ML IV EMUL
INTRAVENOUS | Status: DC | PRN
  Administered 2018-07-12: 75 ug/kg/min via INTRAVENOUS

## 2018-07-12 MED ORDER — PROPOFOL 10 MG/ML IV BOLUS
INTRAVENOUS | Status: DC | PRN
  Administered 2018-07-12 (×5): 20 mg via INTRAVENOUS

## 2018-07-12 SURGICAL SUPPLY — 15 items

## 2018-07-12 NOTE — Brief Op Note (Signed)
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 Moderate duodenitis seen. Gastritis. Small amount of oozing of 2nd portion of duodenum (inflammation) from endoscope that spontaneously resolved. Diet per primary team. No further GI endoscopic plans at this time. Follow H/Hs.

## 2018-07-12 NOTE — Anesthesia Postprocedure Evaluation (Signed)
Anesthesia Post Note  Patient: CARYSSA ELZEY  Procedure(s) Performed: ESOPHAGOGASTRODUODENOSCOPY (EGD) WITH PROPOFOL (Left ) HOT HEMOSTASIS (ARGON PLASMA COAGULATION/BICAP) (N/A )     Patient location during evaluation: Endoscopy Anesthesia Type: MAC Level of consciousness: awake and alert Pain management: pain level controlled Vital Signs Assessment: post-procedure vital signs reviewed and stable Respiratory status: spontaneous breathing, nonlabored ventilation and respiratory function stable Cardiovascular status: blood pressure returned to baseline and stable Postop Assessment: no apparent nausea or vomiting Anesthetic complications: no    Last Vitals:  Vitals:   07/12/18 1032 07/12/18 1044  BP: (!) 134/43 131/60  Pulse:  63  Resp:  18  Temp:  (!) 36.3 C  SpO2:  100%    Last Pain:  Vitals:   07/12/18 1044  TempSrc: Oral  PainSc:                  Lidia Collum

## 2018-07-12 NOTE — Transfer of Care (Signed)
Immediate Anesthesia Transfer of Care Note  Patient: SAVANAH BAYLES  Procedure(s) Performed: ESOPHAGOGASTRODUODENOSCOPY (EGD) WITH PROPOFOL (Left ) HOT HEMOSTASIS (ARGON PLASMA COAGULATION/BICAP) (N/A )  Patient Location: Endoscopy Unit  Anesthesia Type:MAC  Level of Consciousness: drowsy  Airway & Oxygen Therapy: Patient Spontanous Breathing and Patient connected to nasal cannula oxygen  Post-op Assessment: Report given to RN, Post -op Vital signs reviewed and stable and Patient moving all extremities  Post vital signs: Reviewed and stable  Last Vitals:  Vitals Value Taken Time  BP 107/35 07/12/18 1012  Temp 36.7 C 07/12/18 1012  Pulse 73 07/12/18 1014  Resp 19 07/12/18 1014  SpO2 99 % 07/12/18 1014  Vitals shown include unvalidated device data.  Last Pain:  Vitals:   07/12/18 1012  TempSrc: Oral  PainSc: 0-No pain         Complications: No apparent anesthesia complications

## 2018-07-12 NOTE — Anesthesia Preprocedure Evaluation (Signed)
Anesthesia Evaluation  Patient identified by MRN, date of birth, ID band Patient awake    Reviewed: Allergy & Precautions, NPO status , Patient's Chart, lab work & pertinent test results  History of Anesthesia Complications Negative for: history of anesthetic complications  Airway Mallampati: III  TM Distance: >3 FB Neck ROM: Full    Dental  (+) Upper Dentures, Lower Dentures   Pulmonary asthma , sleep apnea , COPD,  COPD inhaler, former smoker,    Pulmonary exam normal        Cardiovascular hypertension, Pt. on medications + Peripheral Vascular Disease  Normal cardiovascular exam     Neuro/Psych PSYCHIATRIC DISORDERS Depression CVA (aphasia), Residual Symptoms    GI/Hepatic negative GI ROS, Neg liver ROS,   Endo/Other  negative endocrine ROS  Renal/GU negative Renal ROS  negative genitourinary   Musculoskeletal negative musculoskeletal ROS (+)   Abdominal   Peds  Hematology  (+) anemia ,   Anesthesia Other Findings   Reproductive/Obstetrics                            Anesthesia Physical Anesthesia Plan  ASA: III  Anesthesia Plan: MAC   Post-op Pain Management:    Induction:   PONV Risk Score and Plan: 2 and Propofol infusion and Treatment may vary due to age or medical condition  Airway Management Planned: Natural Airway and Nasal Cannula  Additional Equipment: None  Intra-op Plan:   Post-operative Plan:   Informed Consent: I have reviewed the patients History and Physical, chart, labs and discussed the procedure including the risks, benefits and alternatives for the proposed anesthesia with the patient or authorized representative who has indicated his/her understanding and acceptance.       Plan Discussed with:   Anesthesia Plan Comments:        Anesthesia Quick Evaluation

## 2018-07-12 NOTE — Progress Notes (Signed)
Offered to call patient's family.  She said she only needed me to call her caregiver for a ride home.  I told her to let me know if she changed her mind.

## 2018-07-12 NOTE — Progress Notes (Signed)
This RN offered to call patient's family to provide them with updates regarding her care. Patient declined at this time. Patient informed by this RN to let her know if she changes her mind.

## 2018-07-12 NOTE — Interval H&P Note (Signed)
History and Physical Interval Note:  07/12/2018 9:42 AM  [name]  has presented today for surgery, with the diagnosis of anemia, hemoccult positive stool.  The various methods of treatment have been discussed with the patient and family. After consideration of risks, benefits and other options for treatment, the patient has consented to  Procedure(s): ESOPHAGOGASTRODUODENOSCOPY (EGD) WITH PROPOFOL (Left) as a surgical intervention.  The patient's history has been reviewed, patient examined, no change in status, stable for surgery.  I have reviewed the patient's chart and labs.  Questions were answered to the patient's satisfaction.     Lear Ng

## 2018-07-12 NOTE — Progress Notes (Signed)
PROGRESS NOTE  [name] YOV:[id number] DOB: [birth year] DOA: 07/10/2018 PCP: Shon Baton, MD   LOS: 2 days   Brief Narrative / Interim history: 73 year old female with history of cryptogenic PCA stroke with resultant aphasia couple of months ago, hypertension, squamous cell carcinoma of the tongue, hyperlipidemia, who was recently hospitalized for the stroke and was discharged to a rehab and has been home for the past couple of weeks came to the hospital because on routine blood work she was found to have a hemoglobin in the 6 range.  She was also found to be fecal occult positive in the ED and she was admitted and GI was consulted.  Patient today denies any blood in her stools, does report black stools but that has been the case since she has been on iron.  She is being followed by [facility] and most recent colonoscopy was 3 years ago by Dr. Cristina Gong  Subjective: No complaints, awaiting EGD.  Denies any nausea vomiting, no abdominal pain.  No blood in her stool.  Assessment & Plan: Active Problems:   GI bleed   Principal Problem Acute blood loss anemia -Patient was hospitalized in April 2020 with a cryptogenic CVA, neurology was consulted and recommended at that time dual antiplatelet therapy with aspirin and Plavix for 3 months and subsequently just use of Plavix -These are now on hold, GI consulted and plans for EGD this morning  Active Problems Recent CVA -Hold antiplatelets today, pending EGD may need to discuss with GI and neurology  Tongue cancer  -Status post surgery in the past  Depression -Continue Lexapro  COPD -No wheezing  Hypertension -Hold Cozaar, amlodipine, blood pressure stable today  Hyperlipidemia -Continue statin  Scheduled Meds: . sodium chloride   Intravenous Once  . atorvastatin  10 mg Oral Daily  . cholecalciferol  2,000 Units Oral Daily  . escitalopram  20 mg Oral Daily  . mometasone-formoterol  2 puff Inhalation BID  . pantoprazole  40  mg Oral BID AC  . sodium chloride flush  3 mL Intravenous Q12H   Continuous Infusions: . sodium chloride     PRN Meds:.sodium chloride, acetaminophen, albuterol, azelastine, ondansetron **OR** ondansetron (ZOFRAN) IV, sodium chloride flush  DVT prophylaxis: SCDs Code Status: Full code Family Communication: d/w patient  Disposition Plan: home when ready   Consultants:   GI  Procedures:   None   Antimicrobials:  None    Objective: Vitals:   07/12/18 1030 07/12/18 1031 07/12/18 1032 07/12/18 1044  BP:  (!) 122/41 (!) 134/43 131/60  Pulse: 71 71  63  Resp: 16 16  18   Temp:    (!) 97.4 F (36.3 C)  TempSrc:    Oral  SpO2: 100% 100%  100%    Intake/Output Summary (Last 24 hours) at 07/12/2018 1144 Last data filed at 07/12/2018 1015 Gross per 24 hour  Intake 400 ml  Output -  Net 400 ml   There were no vitals filed for this visit.  Examination:  Constitutional: She is in no distress Eyes: No scleral icterus ENMT: Moist mucous membranes Respiratory: Clear to auscultation bilaterally, no wheezing, no crackles, normal respiratory effort Cardiovascular: Regular rate and rhythm, no murmurs appreciated.  No peripheral edema Abdomen: Soft, nontender, nondistended, bowel sounds positive Musculoskeletal: no clubbing / cyanosis. Skin: No rashes seen Neurologic: Nonfocal, equal strength Psychiatric: Normal judgment and insight.   Data Reviewed: I have independently reviewed following labs and imaging studies   CBC: Recent Labs  Lab 07/10/18 1355  07/11/18 0315 07/12/18 0234  WBC 5.9 5.5 6.0  NEUTROABS 3.4  --   --   HGB 6.1* 8.0* 7.6*  HCT 20.5* 25.5* 24.2*  MCV 93.2 88.9 88.3  PLT 345 326 128   Basic Metabolic Panel: Recent Labs  Lab 07/10/18 1355 07/12/18 0234  NA 136 137  K 4.2 4.1  CL 104 107  CO2 24 23  GLUCOSE 110* 109*  BUN 21 23  CREATININE 1.00 1.14*  CALCIUM 8.8* 8.9   GFR: CrCl cannot be calculated (Unknown ideal weight.). Liver Function  Tests: Recent Labs  Lab 07/10/18 1355  AST 21  ALT 15  ALKPHOS 65  BILITOT 0.2*  PROT 5.9*  ALBUMIN 3.4*   No results for input(s): LIPASE, AMYLASE in the last 168 hours. No results for input(s): AMMONIA in the last 168 hours. Coagulation Profile: No results for input(s): INR, PROTIME in the last 168 hours. Cardiac Enzymes: No results for input(s): CKTOTAL, CKMB, CKMBINDEX, TROPONINI in the last 168 hours. BNP (last 3 results) No results for input(s): PROBNP in the last 8760 hours. HbA1C: No results for input(s): HGBA1C in the last 72 hours. CBG: No results for input(s): GLUCAP in the last 168 hours. Lipid Profile: No results for input(s): CHOL, HDL, LDLCALC, TRIG, CHOLHDL, LDLDIRECT in the last 72 hours. Thyroid Function Tests: No results for input(s): TSH, T4TOTAL, FREET4, T3FREE, THYROIDAB in the last 72 hours. Anemia Panel: No results for input(s): VITAMINB12, FOLATE, FERRITIN, TIBC, IRON, RETICCTPCT in the last 72 hours. Urine analysis:    Component Value Date/Time   COLORURINE STRAW (A) 03/18/2016 1328   APPEARANCEUR CLEAR 03/18/2016 1328   LABSPEC 1.003 (L) 03/18/2016 1328   PHURINE 5.0 03/18/2016 1328   GLUCOSEU NEGATIVE 03/18/2016 1328   HGBUR NEGATIVE 03/18/2016 1328   BILIRUBINUR NEGATIVE 03/18/2016 1328   KETONESUR NEGATIVE 03/18/2016 1328   PROTEINUR NEGATIVE 03/18/2016 1328   NITRITE NEGATIVE 03/18/2016 1328   LEUKOCYTESUR NEGATIVE 03/18/2016 1328   Sepsis Labs: Invalid input(s): PROCALCITONIN, LACTICIDVEN  Recent Results (from the past 240 hour(s))  SARS Coronavirus 2 (CEPHEID - Performed in [place] hospital lab), Hosp Order     Status: None   Collection Time: 07/11/18  6:07 PM   Specimen: Nasopharyngeal Swab  Result Value Ref Range Status   SARS Coronavirus 2 NEGATIVE NEGATIVE Final    Comment: (NOTE) If result is NEGATIVE SARS-CoV-2 target nucleic acids are NOT DETECTED. The SARS-CoV-2 RNA is generally detectable in upper and lower   respiratory specimens during the acute phase of infection. The lowest  concentration of SARS-CoV-2 viral copies this assay can detect is 250  copies / mL. A negative result does not preclude SARS-CoV-2 infection  and should not be used as the sole basis for treatment or other  patient management decisions.  A negative result may occur with  improper specimen collection / handling, submission of specimen other  than nasopharyngeal swab, presence of viral mutation(s) within the  areas targeted by this assay, and inadequate number of viral copies  (<250 copies / mL). A negative result must be combined with clinical  observations, patient history, and epidemiological information. If result is POSITIVE SARS-CoV-2 target nucleic acids are DETECTED. The SARS-CoV-2 RNA is generally detectable in upper and lower  respiratory specimens dur ing the acute phase of infection.  Positive  results are indicative of active infection with SARS-CoV-2.  Clinical  correlation with patient history and other diagnostic information is  necessary to determine patient infection status.  Positive results  do  not rule out bacterial infection or co-infection with other viruses. If result is PRESUMPTIVE POSTIVE SARS-CoV-2 nucleic acids MAY BE PRESENT.   A presumptive positive result was obtained on the submitted specimen  and confirmed on repeat testing.  While 2019 novel coronavirus  (SARS-CoV-2) nucleic acids may be present in the submitted sample  additional confirmatory testing may be necessary for epidemiological  and / or clinical management purposes  to differentiate between  SARS-CoV-2 and other Sarbecovirus currently known to infect humans.  If clinically indicated additional testing with an alternate test  methodology [phone]) is advised. The SARS-CoV-2 RNA is generally  detectable in upper and lower respiratory sp ecimens during the acute  phase of infection. The expected result is Negative. Fact  Sheet for Patients:  StrictlyIdeas.no Fact Sheet for Healthcare Providers: BankingDealers.co.za This test is not yet approved or cleared by the [place] FDA and has been authorized for detection and/or diagnosis of SARS-CoV-2 by FDA under an Emergency Use Authorization (EUA).  This EUA will remain in effect (meaning this test can be used) for the duration of the COVID-19 declaration under Section 564(b)(1) of the Act, 21 U.S.C. section 360bbb-3(b)(1), unless the authorization is terminated or revoked sooner. Performed at [facility], [place] [address] [place], [place] [address]       Radiology Studies: No results found.   Marzetta Board, MD, PhD [facility]  Contact via  [email or web address]  [place] P: [phone]  F: [phone]

## 2018-07-12 NOTE — Op Note (Signed)
[facility] Patient Name: [name] Procedure Date : 07/12/2018 MRN: [id number] Attending MD: Lear Ng , MD Date of Birth: 1945-05-22 CSN: [id number] Age: 73 Admit Type: Inpatient Procedure:                Upper GI endoscopy Indications:              Iron deficiency anemia, Heme positive stool Providers:                Lear Ng, MD, Carlyn Reichert, RN, Cletis Athens, Technician, Raphael Gibney, CRNA Referring MD:             hospital team Medicines:                Propofol per Anesthesia, Monitored Anesthesia Care Complications:            No immediate complications. Estimated Blood Loss:     Estimated blood loss was minimal. Procedure:                Pre-Anesthesia Assessment:                           - Prior to the procedure, a History and Physical                            was performed, and patient medications and                            allergies were reviewed. The patient's tolerance of                            previous anesthesia was also reviewed. The risks                            and benefits of the procedure and the sedation                            options and risks were discussed with the patient.                            All questions were answered, and informed consent                            was obtained. Prior Anticoagulants: The patient has                            taken no previous anticoagulant or antiplatelet                            agents. ASA Grade Assessment: III - A patient with                            severe systemic disease. After reviewing the risks  and benefits, the patient was deemed in                            satisfactory condition to undergo the procedure.                           After obtaining informed consent, the endoscope was                            passed under direct vision. Throughout the   procedure, the patient's blood pressure, pulse, and                            oxygen saturations were monitored continuously. The                            [id number]-H190 ([id number]) Olympus gastroscope was                            introduced through the mouth, and advanced to the                            second part of duodenum. The upper GI endoscopy was                            accomplished without difficulty. The patient                            tolerated the procedure fairly well. Scope In: Scope Out: Findings:      The examined esophagus was normal.      The Z-line was regular and was found 40 cm from the incisors.      Patchy mild inflammation characterized by congestion (edema), erosions       and erythema was found in the prepyloric region of the stomach.      A medium-sized hiatal hernia was present.      Patchy moderate mucosal changes characterized by congestion, erythema,       erosion and nodularity were found in the duodenal bulb.      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 Fulguration to ablate       the lesion by argon plasma was successful. Impression:               - Normal esophagus.                           - Z-line regular, 40 cm from the incisors.                           - Acute gastritis.                           - Medium-sized hiatal hernia.                           - Mucosal changes in  the duodenum.                           - Mucosal changes in the duodenum. Treated with                            argon plasma coagulation (APC).                           - No specimens collected. Recommendation:           - NPO.                           - Observe patient's clinical course. Procedure Code(s):        --- Professional ---                           [id number], Esophagogastroduodenoscopy, flexible,                            transoral; with ablation of tumor(s), polyp(s),  or                            other lesion(s) (includes pre- and post-dilation                            and guide wire passage, when performed) Diagnosis Code(s):        --- Professional ---                           D50.9, Iron deficiency anemia, unspecified                           R19.5, Other fecal abnormalities                           K29.00, Acute gastritis without bleeding                           K44.9, Diaphragmatic hernia without obstruction or                            gangrene                           K31.89, Other diseases of stomach and duodenum CPT copyright 2019 American Medical Association. All rights reserved. The codes documented in this report are preliminary and upon coder review may  be revised to meet current compliance requirements. Lear Ng, MD 07/12/2018 10:23:18 AM This report has been signed electronically. Number of Addenda: 0

## 2018-07-13 DIAGNOSIS — Z8673 Personal history of transient ischemic attack (TIA), and cerebral infarction without residual deficits: Secondary | ICD-10-CM

## 2018-07-13 DIAGNOSIS — K922 Gastrointestinal hemorrhage, unspecified: Secondary | ICD-10-CM

## 2018-07-13 LAB — BASIC METABOLIC PANEL
Anion gap: 8 (ref 5–15)
BUN: 16 mg/dL (ref 8–23)
CO2: 23 mmol/L (ref 22–32)
Calcium: 8.9 mg/dL (ref 8.9–10.3)
Chloride: 107 mmol/L (ref 98–111)
Creatinine, Ser: 0.9 mg/dL (ref 0.44–1.00)
GFR calc Af Amer: >60 mL/min (ref >60)
GFR calc non Af Amer: >60 mL/min (ref >60)
Glucose, Bld: 91 mg/dL (ref 70–99)
Potassium: 3.7 mmol/L (ref 3.5–5.1)
Sodium: 138 mmol/L (ref 135–145)

## 2018-07-13 LAB — CBC
HCT: 24.6 % — ABNORMAL LOW (ref 36.0–46.0)
Hemoglobin: 7.7 g/dL — ABNORMAL LOW (ref 12.0–15.0)
MCH: 27.5 pg (ref 26.0–34.0)
MCHC: 31.3 g/dL (ref 30.0–36.0)
MCV: 87.9 fL (ref 80.0–100.0)
Platelets: 312 10*3/uL (ref 150–400)
RBC: 2.8 MIL/uL — ABNORMAL LOW (ref 3.87–5.11)
RDW: 14.6 % (ref 11.5–15.5)
WBC: 6.1 10*3/uL (ref 4.0–10.5)
nRBC: 0 % (ref 0.0–0.2)

## 2018-07-13 MED ORDER — CLOPIDOGREL BISULFATE 75 MG PO TABS: 75.0000 mg | ORAL_TABLET | Freq: Every day | ORAL | 1 refills | Status: DC

## 2018-07-13 MED ORDER — PANTOPRAZOLE SODIUM 40 MG PO TBEC: 40.0000 mg | DELAYED_RELEASE_TABLET | Freq: Every day | ORAL | 1 refills | Status: DC

## 2018-07-13 MED ORDER — FERROUS SULFATE 325 (65 FE) MG PO TABS: 325.0000 mg | ORAL_TABLET | Freq: Every day | ORAL | 1 refills | Status: DC

## 2018-07-13 MED ORDER — ASPIRIN 81 MG PO TABS: 81.0000 mg | ORAL_TABLET | Freq: Every day | ORAL | 0 refills | Status: AC

## 2018-07-13 NOTE — Discharge Instructions (Signed)
Follow with Shon Baton, MD in 5-7 days  Follow up with Dr. Cristina Gong in 1-2 weeks, office will call for follow up appointment  Take Aspirin 81 mg daily for 3 days (Sat, Sun, Mon) then stop. Do not take (Clopidogrel) Plavix these days.   On Tue 6/30 you can resume (Clopidogrel) Plavix and stop the aspirin.  If you have blood in your stools please stop Aspirin / Plavix and seek care right away.  Please get a complete blood count and chemistry panel checked by your Primary MD at your next visit, and again as instructed by your Primary MD. Please get your medications reviewed and adjusted by your Primary MD.  Please request your Primary MD to go over all Hospital Tests and Procedure/Radiological results at the follow up, please get all Hospital records sent to your Prim MD by signing hospital release before you go home.  In some cases, there will be blood work, cultures and biopsy results pending at the time of your discharge. Please request that your primary care M.D. goes through all the records of your hospital data and follows up on these results.  If you had Pneumonia of Lung problems at the Hospital: Please get a 2 view Chest X ray done in 6-8 weeks after hospital discharge or sooner if instructed by your Primary MD.  If you have Congestive Heart Failure: Please call your Cardiologist or Primary MD anytime you have any of the following symptoms:  1) 3 pound weight gain in 24 hours or 5 pounds in 1 week  2) shortness of breath, with or without a dry hacking cough  3) swelling in the hands, feet or stomach  4) if you have to sleep on extra pillows at night in order to breathe  Follow cardiac low salt diet and 1.5 lit/day fluid restriction.  If you have diabetes Accuchecks 4 times/day, Once in AM empty stomach and then before each meal. Log in all results and show them to your primary doctor at your next visit. If any glucose reading is under 80 or above 300 call your primary MD  immediately.  If you have Seizure/Convulsions/Epilepsy: Please do not drive, operate heavy machinery, participate in activities at heights or participate in high speed sports until you have seen by Primary MD or a Neurologist and advised to do so again. Per [facility] statutes, patients with seizures are not allowed to drive until they have been seizure-free for six months.  Use caution when using heavy equipment or power tools. Avoid working on ladders or at heights. Take showers instead of baths. Ensure the water temperature is not too high on the home water heater. Do not go swimming alone. Do not lock yourself in a room alone (i.e. bathroom). When caring for infants or small children, sit down when holding, feeding, or changing them to minimize risk of injury to the child in the event you have a seizure. Maintain good sleep hygiene. Avoid alcohol.   If you had Gastrointestinal Bleeding: Please ask your Primary MD to check a complete blood count within one week of discharge or at your next visit. Your endoscopic/colonoscopic biopsies that are pending at the time of discharge, will also need to followed by your Primary MD.  Get Medicines reviewed and adjusted. Please take all your medications with you for your next visit with your Primary MD  Please request your Primary MD to go over all hospital tests and procedure/radiological results at the follow up, please ask your Primary  MD to get all Hospital records sent to his/her office.  If you experience worsening of your admission symptoms, develop shortness of breath, life threatening emergency, suicidal or homicidal thoughts you must seek medical attention immediately by calling 911 or calling your MD immediately  if symptoms less severe.  You must read complete instructions/literature along with all the possible adverse reactions/side effects for all the Medicines you take and that have been prescribed to you. Take any new Medicines after  you have completely understood and accpet all the possible adverse reactions/side effects.   Do not drive or operate heavy machinery when taking Pain medications.   Do not take more than prescribed Pain, Sleep and Anxiety Medications  Special Instructions: If you have smoked or chewed Tobacco  in the last 2 yrs please stop smoking, stop any regular Alcohol  and or any Recreational drug use.  Wear Seat belts while driving.  Please note You were cared for by a hospitalist during your hospital stay. If you have any questions about your discharge medications or the care you received while you were in the hospital after you are discharged, you can call the unit and asked to speak with the hospitalist on call if the hospitalist that took care of you is not available. Once you are discharged, your primary care physician will handle any further medical issues. Please note that NO REFILLS for any discharge medications will be authorized once you are discharged, as it is imperative that you return to your primary care physician (or establish a relationship with a primary care physician if you do not have one) for your aftercare needs so that they can reassess your need for medications and monitor your lab values.  You can reach the hospitalist office at phone [phone] or fax [phone]   If you do not have a primary care physician, you can call [phone] for a physician referral.  Activity: As tolerated with Full fall precautions use walker/cane & assistance as needed    Diet: regular  Disposition Home

## 2018-07-13 NOTE — Discharge Planning (Signed)
Nsg Discharge Note  Admit Date:  07/10/2018 Discharge date: 07/13/2018   [name] to be D/C'd Home per MD order.  AVS completed.    Discharge Medication: Allergies as of 07/13/2018      Reactions   Codeine Itching, Other (See Comments)   "Broke out my mouth"   Neosporin [neomycin-bacitracin Zn-polymyx] Itching, Swelling   Swelling at application site      Medication List    TAKE these medications   acetaminophen 325 MG tablet Commonly known as: TYLENOL Take 2 tablets (650 mg total) by mouth every 6 (six) hours as needed for mild pain or headache (or temp > 37.5 C (99.5 F)).   albuterol (2.5 MG/3ML) 0.083% nebulizer solution Commonly known as: PROVENTIL Take 2.5 mg by nebulization every 6 (six) hours as needed for wheezing or shortness of breath.   Albuterol Sulfate 108 (90 Base) MCG/ACT Aepb Inhale 2 puffs into the lungs every 6 (six) hours as needed for shortness of breath.   amLODipine 10 MG tablet Commonly known as: NORVASC Take 1 tablet (10 mg total) by mouth daily.   aspirin 81 MG tablet Take 1 tablet (81 mg total) by mouth daily for 3 days.   atorvastatin 10 MG tablet Commonly known as: LIPITOR Take 10 mg by mouth daily.   azelastine 0.1 % nasal spray Commonly known as: ASTELIN Place 1 spray into both nostrils 2 (two) times daily as needed for allergies. Use in each nostril as directed   budesonide-formoterol 80-4.5 MCG/ACT inhaler Commonly known as: SYMBICORT Inhale 2 puffs into the lungs 2 (two) times daily as needed (FOR RESPIRATORY ISSUES.).   carboxymethylcellulose 0.5 % Soln Commonly known as: REFRESH PLUS Place 1 drop into both eyes 3 (three) times daily as needed (for dryness).   clopidogrel 75 MG tablet Commonly known as: PLAVIX Take 1 tablet (75 mg total) by mouth daily. Start taking on: July 17, 2018 What changed: These instructions start on July 17, 2018. If you are unsure what to do until then, ask your doctor or other care  provider.   escitalopram 20 MG tablet Commonly known as: LEXAPRO Take 20 mg by mouth daily.   ferrous sulfate 325 (65 FE) MG tablet Take 1 tablet (325 mg total) by mouth daily with breakfast. What changed: when to take this   losartan 25 MG tablet Commonly known as: COZAAR Take 1 tablet (25 mg total) by mouth daily.   nicotine 21 mg/24hr patch Commonly known as: NICODERM CQ - dosed in mg/24 hours Place 1 patch (21 mg total) onto the skin daily at 8 pm.   pantoprazole 40 MG tablet Commonly known as: PROTONIX Take 1 tablet (40 mg total) by mouth daily.   vitamin B-12 1000 MCG tablet Commonly known as: CYANOCOBALAMIN Take 1,000 mcg by mouth daily.   Vitamin D-3 25 MCG (1000 UT) Caps Take 1,000 Units by mouth daily.       Discharge Assessment: Vitals:   07/13/18 0702 07/13/18 0904  BP:  123/66  Pulse:  64  Resp:    Temp:    SpO2: 97% 100%   Skin clean, dry and intact without evidence of skin break down, no evidence of skin tears noted. IV catheter discontinued intact. Site without signs and symptoms of complications - no redness or edema noted at insertion site, patient denies c/o pain - only slight tenderness at site.  Dressing with slight pressure applied.  D/c Instructions-Education: Discharge instructions given to patient/family with verbalized understanding. D/c education completed  with patient/family including follow up instructions, medication list, d/c activities limitations if indicated, with other d/c instructions as indicated by MD - patient able to verbalize understanding, all questions fully answered. Patient instructed to return to ED, call 911, or call MD for any changes in condition.  Belongings returned to patient. Patient escorted via [place], and D/C home [facility].  Hiram Comber, RN 07/13/2018 3:15 PM

## 2018-07-13 NOTE — Progress Notes (Signed)
Subjective: No abdominal pain. No blood in stool  Objective: Vital signs in last 24 hours: Temp:  [98.3 F (36.8 C)] 98.3 F (36.8 C) (06/26 0609) Pulse Rate:  [64-73] 64 (06/26 0904) BP: (120-125)/(51-66) 123/66 (06/26 0904) SpO2:  [97 %-100 %] 100 % (06/26 0904) Weight change:  Last BM Date: 07/11/18  PE: GEN:  NAD  Lab Results: CBC    Component Value Date/Time   WBC 6.1 07/13/2018 0204   RBC 2.80 (L) 07/13/2018 0204   HGB 7.7 (L) 07/13/2018 0204   HCT 24.6 (L) 07/13/2018 0204   PLT 312 07/13/2018 0204   MCV 87.9 07/13/2018 0204   MCH 27.5 07/13/2018 0204   MCHC 31.3 07/13/2018 0204   RDW 14.6 07/13/2018 0204   LYMPHSABS 1.7 07/10/2018 1355   MONOABS 0.6 07/10/2018 1355   EOSABS 0.1 07/10/2018 1355   BASOSABS 0.1 07/10/2018 1355   CMP     Component Value Date/Time   NA 138 07/13/2018 0204   K 3.7 07/13/2018 0204   CL 107 07/13/2018 0204   CO2 23 07/13/2018 0204   GLUCOSE 91 07/13/2018 0204   BUN 16 07/13/2018 0204   CREATININE 0.90 07/13/2018 0204   CALCIUM 8.9 07/13/2018 0204   PROT 5.9 (L) 07/10/2018 1355   ALBUMIN 3.4 (L) 07/10/2018 1355   AST 21 07/10/2018 1355   ALT 15 07/10/2018 1355   ALKPHOS 65 07/10/2018 1355   BILITOT 0.2 (L) 07/10/2018 1355   GFRNONAA >60 07/13/2018 0204   GFRAA >60 07/13/2018 0204    Assessment:  1.  Anemia with hemoccult positive stools. 2.  Duodenal AVM s/p APC, unclear if contributing to anemia. 3.  History recurrent strokes, on aspirin and clopidigrel.  Plan:  1.  Pantoprazole 40 mg po qd indefinitely. 2.  Hold clopidigrel for another 3 days, then resume; ok to be on ASA 81/day now. 3.  Nu-Iron 150 mg po qd (or equivalent) upon discharge. 4.  Will make arrangements for outpatient labs through Dr. Osborn Coho staff; if hgb continues to drop, patient might need outpatient capsule endoscopy. 5.  [facility] will sign-off; please call with questions; case reviewed with Dr. Cruzita Lederer of the hospitalist  team.   Landry Dyke 07/13/2018, 11:10 AM   Cell [phone] If no answer or after 5 PM call [phone]

## 2018-07-13 NOTE — Discharge Summary (Signed)
Physician Discharge Summary  [name] [id number] DOB: Oct 24, 1945 DOA: 07/10/2018  PCP: Shon Baton, MD  Admit date: 07/10/2018 Discharge date: 07/13/2018  Admitted From: home Disposition:  Home   Recommendations for Outpatient Follow-up:  1. Follow up with PCP in 1-2 weeks 2. Please obtain BMP/CBC in one week 3. Follow up with Dr. Cristina Gong in 1-2 weeks  Home Health: none Equipment/Devices: none  Discharge Condition: stable CODE STATUS: Full code Diet recommendation: regular   HPI: Per admitting MD, [name]  is a 73 y.o. female, past medical history significant for cryptogenic PCA stroke, with resultant aphasia hypertension squamous cell carcinoma of the tongue, and hyperlipidemia who was sent to the emergency room today for evaluation after her hemoglobin was noticed to be low on outpatient testing.  Patient denies any chest pains shortness of breath nausea vomiting diarrhea denies any headache.  Denies any blood in her stools or urine Patient had colonoscopy 3 years ago which was negative by Dr. Cristina Gong from [facility] Course: Acute blood loss anemia -Patient was hospitalized in April 2020 with a cryptogenic CVA, neurology was consulted and recommended at that time dual antiplatelet therapy with aspirin and Plavix for 3 weeks and subsequently just use of Plavix, however patient was still taking dual antiplatelet therapy during this admission.  Gastroenterology was consulted due to concern for GI bleed.  Patient underwent an EGD on 07/12/2018 which showed duodenal AVMs status post APC, moderate duodenitis and gastritis.  Following that, patient was returned to the floor, she has remained stable, her diet was advanced, her hemoglobin did not drop and she will be discharged home in stable condition.  She was placed on PPI.  Case was discussed with neurology as well as gastroenterology, she does not need dual antiplatelet therapy anymore, she will be placed on  aspirin for the next 3 days and then resume Plavix as per initial plan after 3 days as she needs to be on Plavix long-term.  She was advised to follow-up with Dr. Cristina Gong with gastroenterology for repeat CBC.  Active Problems Recent CVA -see discussion above, okay with aspirin for now and hold Plavix for 3 days, resume Plavix onward and she will come off of aspirin  Tongue cancer  -Status post surgery in the past  Depression -Continue Lexapro  COPD -No wheezing  Hypertension -continue home medications  Hyperlipidemia -Continue statin   Discharge Diagnoses:  Active Problems:   GI bleed     Discharge Instructions   Allergies as of 07/13/2018      Reactions   Codeine Itching, Other (See Comments)   "Broke out my mouth"   Neosporin [neomycin-bacitracin Zn-polymyx] Itching, Swelling   Swelling at application site      Medication List    TAKE these medications   acetaminophen 325 MG tablet Commonly known as: TYLENOL Take 2 tablets (650 mg total) by mouth every 6 (six) hours as needed for mild pain or headache (or temp > 37.5 C (99.5 F)).   albuterol (2.5 MG/3ML) 0.083% nebulizer solution Commonly known as: PROVENTIL Take 2.5 mg by nebulization every 6 (six) hours as needed for wheezing or shortness of breath.   Albuterol Sulfate 108 (90 Base) MCG/ACT Aepb Inhale 2 puffs into the lungs every 6 (six) hours as needed for shortness of breath.   amLODipine 10 MG tablet Commonly known as: NORVASC Take 1 tablet (10 mg total) by mouth daily.   aspirin 81 MG tablet Take 1 tablet (81 mg total) by mouth  daily for 3 days.   atorvastatin 10 MG tablet Commonly known as: LIPITOR Take 10 mg by mouth daily.   azelastine 0.1 % nasal spray Commonly known as: ASTELIN Place 1 spray into both nostrils 2 (two) times daily as needed for allergies. Use in each nostril as directed   budesonide-formoterol 80-4.5 MCG/ACT inhaler Commonly known as: SYMBICORT Inhale 2 puffs into the  lungs 2 (two) times daily as needed (FOR RESPIRATORY ISSUES.).   carboxymethylcellulose 0.5 % Soln Commonly known as: REFRESH PLUS Place 1 drop into both eyes 3 (three) times daily as needed (for dryness).   clopidogrel 75 MG tablet Commonly known as: PLAVIX Take 1 tablet (75 mg total) by mouth daily. Start taking on: July 17, 2018 What changed: These instructions start on July 17, 2018. If you are unsure what to do until then, ask your doctor or other care provider.   escitalopram 20 MG tablet Commonly known as: LEXAPRO Take 20 mg by mouth daily.   ferrous sulfate 325 (65 FE) MG tablet Take 1 tablet (325 mg total) by mouth daily with breakfast. What changed: when to take this   losartan 25 MG tablet Commonly known as: COZAAR Take 1 tablet (25 mg total) by mouth daily.   nicotine 21 mg/24hr patch Commonly known as: NICODERM CQ - dosed in mg/24 hours Place 1 patch (21 mg total) onto the skin daily at 8 pm.   pantoprazole 40 MG tablet Commonly known as: PROTONIX Take 1 tablet (40 mg total) by mouth daily.   vitamin B-12 1000 MCG tablet Commonly known as: CYANOCOBALAMIN Take 1,000 mcg by mouth daily.   Vitamin D-3 25 MCG (1000 UT) Caps Take 1,000 Units by mouth daily.      Follow-up Information    Shon Baton, MD .   Specialty: Internal Medicine Contact information: [place] [phone]           Consultations:  Gastroenterology  Procedures/Studies:  EGD 6/25 Impression:               - Normal esophagus.                           - Z-line regular, 40 cm from the incisors.                           - Acute gastritis.                           - Medium-sized hiatal hernia.                           - Mucosal changes in the duodenum.                           - Mucosal changes in the duodenum. Treated with                            argon plasma coagulation (APC).                           - No specimens collected. No  results found.   Subjective: - no chest pain, shortness of breath, no abdominal pain, nausea or vomiting.   Discharge  Exam: BP 123/66    Pulse 64    Temp 98.3 F (36.8 C) (Oral)    Resp 18    SpO2 100%   General: Pt is alert, awake, not in acute distress Cardiovascular: RRR, S1/S2 +, no rubs, no gallops Respiratory: CTA bilaterally, no wheezing, no rhonchi Abdominal: Soft, NT, ND, bowel sounds + Extremities: no edema, no cyanosis    The results of significant diagnostics from this hospitalization (including imaging, microbiology, ancillary and laboratory) are listed below for reference.     Microbiology: Recent Results (from the past 240 hour(s))  Novel Coronavirus,NAA,(SEND-OUT TO REF LAB - TAT 24-48 hrs); Hosp Order     Status: None   Collection Time: 07/10/18  6:15 PM   Specimen: Nasopharyngeal Swab; Respiratory  Result Value Ref Range Status   SARS-CoV-2, NAA NOT DETECTED NOT DETECTED Final    Comment: (NOTE) This test was developed and its performance characteristics determined by [employer]. This test has not been FDA cleared or approved. This test has been authorized by FDA under an Emergency Use Authorization (EUA). This test is only authorized for the duration of time the declaration that circumstances exist justifying the authorization of the emergency use of in vitro diagnostic tests for detection of SARS-CoV-2 virus and/or diagnosis of COVID-19 infection under section 564(b)(1) of the Act, 21 U.S.C. 672CNO-7(S)(9), unless the authorization is terminated or revoked sooner. When diagnostic testing is negative, the possibility of a false negative result should be considered in the context of a patient's recent exposures and the presence of clinical signs and symptoms consistent with COVID-19. An individual without symptoms of COVID-19 and who is not shedding SARS-CoV-2 virus would expect to have a negative (not detected) result in this assay. Performed    At: [facility] [address] [place], [place] [id number] Rush Farmer MD [id number]    [place]  Final    Comment: Performed at [facility], [place] [address] [place], [place] [address]  SARS Coronavirus 2 (CEPHEID - Performed in [place] hospital lab), Hosp Order     Status: None   Collection Time: 07/11/18  6:07 PM   Specimen: Nasopharyngeal Swab  Result Value Ref Range Status   SARS Coronavirus 2 NEGATIVE NEGATIVE Final    Comment: (NOTE) If result is NEGATIVE SARS-CoV-2 target nucleic acids are NOT DETECTED. The SARS-CoV-2 RNA is generally detectable in upper and lower  respiratory specimens during the acute phase of infection. The lowest  concentration of SARS-CoV-2 viral copies this assay can detect is 250  copies / mL. A negative result does not preclude SARS-CoV-2 infection  and should not be used as the sole basis for treatment or other  patient management decisions.  A negative result may occur with  improper specimen collection / handling, submission of specimen other  than nasopharyngeal swab, presence of viral mutation(s) within the  areas targeted by this assay, and inadequate number of viral copies  (<250 copies / mL). A negative result must be combined with clinical  observations, patient history, and epidemiological information. If result is POSITIVE SARS-CoV-2 target nucleic acids are DETECTED. The SARS-CoV-2 RNA is generally detectable in upper and lower  respiratory specimens dur ing the acute phase of infection.  Positive  results are indicative of active infection with SARS-CoV-2.  Clinical  correlation with patient history and other diagnostic information is  necessary to determine patient infection status.  Positive results do  not rule out bacterial infection or co-infection with other  viruses. If result is PRESUMPTIVE POSTIVE SARS-CoV-2 nucleic acids MAY BE PRESENT.   A presumptive positive result was  obtained on the submitted specimen  and confirmed on repeat testing.  While 2019 novel coronavirus  (SARS-CoV-2) nucleic acids may be present in the submitted sample  additional confirmatory testing may be necessary for epidemiological  and / or clinical management purposes  to differentiate between  SARS-CoV-2 and other Sarbecovirus currently known to infect humans.  If clinically indicated additional testing with an alternate test  methodology [phone]) is advised. The SARS-CoV-2 RNA is generally  detectable in upper and lower respiratory sp ecimens during the acute  phase of infection. The expected result is Negative. Fact Sheet for Patients:  StrictlyIdeas.no Fact Sheet for Healthcare Providers: BankingDealers.co.za This test is not yet approved or cleared by the [place] FDA and has been authorized for detection and/or diagnosis of SARS-CoV-2 by FDA under an Emergency Use Authorization (EUA).  This EUA will remain in effect (meaning this test can be used) for the duration of the COVID-19 declaration under Section 564(b)(1) of the Act, 21 U.S.C. section 360bbb-3(b)(1), unless the authorization is terminated or revoked sooner. Performed at [facility], [place] [address], [place], [place] [address]      Labs: BNP (last 3 results) No results for input(s): BNP in the last 8760 hours. Basic Metabolic Panel: Recent Labs  Lab 07/10/18 1355 07/12/18 0234 07/13/18 0204  NA 136 137 138  K 4.2 4.1 3.7  CL 104 107 107  CO2 24 23 23   GLUCOSE 110* 109* 91  BUN 21 23 16   CREATININE 1.00 1.14* 0.90  CALCIUM 8.8* 8.9 8.9   Liver Function Tests: Recent Labs  Lab 07/10/18 1355  AST 21  ALT 15  ALKPHOS 65  BILITOT 0.2*  PROT 5.9*  ALBUMIN 3.4*   No results for input(s): LIPASE, AMYLASE in the last 168 hours. No results for input(s): AMMONIA in the last 168 hours. CBC: Recent Labs  Lab 07/10/18 1355 07/11/18 0315  07/12/18 0234 07/12/18 2245 07/13/18 0204  WBC 5.9 5.5 6.0  --  6.1  NEUTROABS 3.4  --   --   --   --   HGB 6.1* 8.0* 7.6* 7.6* 7.7*  HCT 20.5* 25.5* 24.2* 25.0* 24.6*  MCV 93.2 88.9 88.3  --  87.9  PLT 345 326 324  --  312   Cardiac Enzymes: No results for input(s): CKTOTAL, CKMB, CKMBINDEX, TROPONINI in the last 168 hours. BNP: Invalid input(s): POCBNP CBG: No results for input(s): GLUCAP in the last 168 hours. D-Dimer No results for input(s): DDIMER in the last 72 hours. Hgb A1c No results for input(s): HGBA1C in the last 72 hours. Lipid Profile No results for input(s): CHOL, HDL, LDLCALC, TRIG, CHOLHDL, LDLDIRECT in the last 72 hours. Thyroid function studies No results for input(s): TSH, T4TOTAL, T3FREE, THYROIDAB in the last 72 hours.  Invalid input(s): FREET3 Anemia work up No results for input(s): VITAMINB12, FOLATE, FERRITIN, TIBC, IRON, RETICCTPCT in the last 72 hours. Urinalysis    Component Value Date/Time   COLORURINE STRAW (A) 03/18/2016 1328   APPEARANCEUR CLEAR 03/18/2016 1328   LABSPEC 1.003 (L) 03/18/2016 1328   PHURINE 5.0 03/18/2016 1328   GLUCOSEU NEGATIVE 03/18/2016 1328   HGBUR NEGATIVE 03/18/2016 1328   BILIRUBINUR NEGATIVE 03/18/2016 1328   KETONESUR NEGATIVE 03/18/2016 1328   PROTEINUR NEGATIVE 03/18/2016 1328   NITRITE NEGATIVE 03/18/2016 1328   LEUKOCYTESUR NEGATIVE 03/18/2016 1328   Sepsis Labs Invalid input(s):  PROCALCITONIN,  WBC,  LACTICIDVEN  FURTHER DISCHARGE INSTRUCTIONS:   Get Medicines reviewed and adjusted: Please take all your medications with you for your next visit with your Primary MD   Laboratory/radiological data: Please request your Primary MD to go over all hospital tests and procedure/radiological results at the follow up, please ask your Primary MD to get all Hospital records sent to his/her office.   In some cases, they will be blood work, cultures and biopsy results pending at the time of your discharge. Please  request that your primary care M.D. goes through all the records of your hospital data and follows up on these results.   Also Note the following: If you experience worsening of your admission symptoms, develop shortness of breath, life threatening emergency, suicidal or homicidal thoughts you must seek medical attention immediately by calling 911 or calling your MD immediately  if symptoms less severe.   You must read complete instructions/literature along with all the possible adverse reactions/side effects for all the Medicines you take and that have been prescribed to you. Take any new Medicines after you have completely understood and accpet all the possible adverse reactions/side effects.    Do not drive when taking Pain medications or sleeping medications (Benzodaizepines)   Do not take more than prescribed Pain, Sleep and Anxiety Medications. It is not advisable to combine anxiety,sleep and pain medications without talking with your primary care practitioner   Special Instructions: If you have smoked or chewed Tobacco  in the last 2 yrs please stop smoking, stop any regular Alcohol  and or any Recreational drug use.   Wear Seat belts while driving.   Please note: You were cared for by a hospitalist during your hospital stay. Once you are discharged, your primary care physician will handle any further medical issues. Please note that NO REFILLS for any discharge medications will be authorized once you are discharged, as it is imperative that you return to your primary care physician (or establish a relationship with a primary care physician if you do not have one) for your post hospital discharge needs so that they can reassess your need for medications and monitor your lab values.  Time coordinating discharge: 35 minutes  SIGNED:  Marzetta Board, MD, PhD 07/13/2018, 11:29 AM

## 2018-07-14 LAB — BPAM RBC
Blood Product Expiration Date: 202007212359
Blood Product Expiration Date: 202007212359
ISSUE DATE / TIME: 202006231714
Unit Type and Rh: 5100
Unit Type and Rh: 5100

## 2018-07-14 LAB — TYPE AND SCREEN
ABO/RH(D): O POS
Antibody Screen: NEGATIVE
Unit division: 0
Unit division: 0

## 2018-07-14 NOTE — Progress Notes (Signed)
[facility] Summary Report / Loop Recorder 

## 2018-07-18 ENCOUNTER — Other Ambulatory Visit (HOSPITAL_COMMUNITY): Payer: Self-pay

## 2018-07-19 ENCOUNTER — Other Ambulatory Visit: Payer: Self-pay

## 2018-07-19 ENCOUNTER — Ambulatory Visit (HOSPITAL_COMMUNITY)
Admission: RE | Admit: 2018-07-19 | Discharge: 2018-07-19 | Disposition: A | Discharge status: Home / Self Care | Payer: Medicare Other | Source: Ambulatory Visit | Attending: Internal Medicine | Admitting: Internal Medicine

## 2018-07-19 DIAGNOSIS — D649 Anemia, unspecified: Secondary | ICD-10-CM | POA: Diagnosis present

## 2018-07-19 LAB — PREPARE RBC (CROSSMATCH)

## 2018-07-19 MED ORDER — FUROSEMIDE 10 MG/ML IJ SOLN: 20.0000 mg | Freq: Once | INTRAMUSCULAR | Status: DC

## 2018-07-19 MED ORDER — ACETAMINOPHEN 325 MG PO TABS
650.0000 mg | ORAL_TABLET | Freq: Once | ORAL | Status: AC
  Administered 2018-07-19: 10:00:00 650 mg via ORAL

## 2018-07-19 MED ORDER — FUROSEMIDE 10 MG/ML IJ SOLN
INTRAMUSCULAR | Status: AC
  Administered 2018-07-19: 20 mg
  Filled 2018-07-19: qty 2

## 2018-07-19 MED ORDER — SODIUM CHLORIDE 0.9% IV SOLUTION: Freq: Once | INTRAVENOUS | Status: DC

## 2018-07-19 MED ORDER — ACETAMINOPHEN 325 MG PO TABS
ORAL_TABLET | ORAL | Status: AC
  Administered 2018-07-19: 10:00:00 650 mg via ORAL
  Filled 2018-07-19: qty 2

## 2018-07-20 LAB — TYPE AND SCREEN
ABO/RH(D): O POS
Antibody Screen: NEGATIVE
Unit division: 0
Unit division: 0

## 2018-07-20 LAB — BPAM RBC
Blood Product Expiration Date: 202007252359
Blood Product Expiration Date: 202007252359
ISSUE DATE / TIME: 202007020935
ISSUE DATE / TIME: 202007021219
Unit Type and Rh: 5100
Unit Type and Rh: 5100

## 2018-08-06 ENCOUNTER — Ambulatory Visit (INDEPENDENT_AMBULATORY_CARE_PROVIDER_SITE_OTHER): Payer: Medicare Other | Admitting: *Deleted

## 2018-08-06 DIAGNOSIS — I639 Cerebral infarction, unspecified: Secondary | ICD-10-CM

## 2018-08-06 LAB — CUP PACEART REMOTE DEVICE CHECK
Date Time Interrogation Session: 20200720023547
Implantable Pulse Generator Implant Date: 20180305

## 2018-08-21 NOTE — Progress Notes (Signed)
[facility] Summary Report / Loop Recorder 

## 2018-09-07 ENCOUNTER — Ambulatory Visit (INDEPENDENT_AMBULATORY_CARE_PROVIDER_SITE_OTHER): Payer: Medicare Other | Admitting: *Deleted

## 2018-09-07 DIAGNOSIS — I639 Cerebral infarction, unspecified: Secondary | ICD-10-CM

## 2018-09-09 LAB — CUP PACEART REMOTE DEVICE CHECK
Date Time Interrogation Session: 20200822033900
Implantable Pulse Generator Implant Date: 20180305

## 2018-09-14 NOTE — Progress Notes (Signed)
[facility] Summary Report / Loop Recorder 

## 2018-10-10 ENCOUNTER — Ambulatory Visit (INDEPENDENT_AMBULATORY_CARE_PROVIDER_SITE_OTHER): Payer: Medicare Other | Admitting: *Deleted

## 2018-10-10 DIAGNOSIS — I639 Cerebral infarction, unspecified: Secondary | ICD-10-CM

## 2018-10-11 LAB — CUP PACEART REMOTE DEVICE CHECK
Date Time Interrogation Session: 20200924094733
Implantable Pulse Generator Implant Date: 20180305

## 2018-10-16 NOTE — Progress Notes (Signed)
[facility] Summary Report / Loop Recorder 

## 2018-11-13 ENCOUNTER — Ambulatory Visit (INDEPENDENT_AMBULATORY_CARE_PROVIDER_SITE_OTHER): Payer: Medicare Other | Admitting: *Deleted

## 2018-11-13 DIAGNOSIS — I639 Cerebral infarction, unspecified: Secondary | ICD-10-CM | POA: Diagnosis not present

## 2018-11-14 LAB — CUP PACEART REMOTE DEVICE CHECK
Date Time Interrogation Session: 20201027120934
Implantable Pulse Generator Implant Date: 20180305

## 2018-12-04 NOTE — Progress Notes (Signed)
[facility] Summary Report / Loop Recorder 

## 2018-12-16 LAB — CUP PACEART REMOTE DEVICE CHECK
Date Time Interrogation Session: 20201129102226
Implantable Pulse Generator Implant Date: 20180305

## 2018-12-17 ENCOUNTER — Ambulatory Visit (INDEPENDENT_AMBULATORY_CARE_PROVIDER_SITE_OTHER): Payer: Medicare Other | Admitting: *Deleted

## 2018-12-17 DIAGNOSIS — Z8673 Personal history of transient ischemic attack (TIA), and cerebral infarction without residual deficits: Secondary | ICD-10-CM

## 2019-01-09 NOTE — Progress Notes (Signed)
ILR remote 

## 2019-01-17 ENCOUNTER — Ambulatory Visit (INDEPENDENT_AMBULATORY_CARE_PROVIDER_SITE_OTHER): Payer: Medicare Other | Admitting: *Deleted

## 2019-01-17 DIAGNOSIS — Z8673 Personal history of transient ischemic attack (TIA), and cerebral infarction without residual deficits: Secondary | ICD-10-CM

## 2019-01-17 LAB — CUP PACEART REMOTE DEVICE CHECK
Date Time Interrogation Session: 20201230231907
Implantable Pulse Generator Implant Date: 20180305

## 2019-01-18 NOTE — Progress Notes (Signed)
ILR remote 

## 2019-02-06 ENCOUNTER — Ambulatory Visit: Payer: Medicare PPO | Attending: Internal Medicine

## 2019-02-06 DIAGNOSIS — Z23 Encounter for immunization: Secondary | ICD-10-CM | POA: Insufficient documentation

## 2019-02-18 ENCOUNTER — Ambulatory Visit (INDEPENDENT_AMBULATORY_CARE_PROVIDER_SITE_OTHER): Payer: Medicare PPO | Admitting: *Deleted

## 2019-02-18 DIAGNOSIS — Z8673 Personal history of transient ischemic attack (TIA), and cerebral infarction without residual deficits: Secondary | ICD-10-CM | POA: Diagnosis not present

## 2019-02-18 LAB — CUP PACEART REMOTE DEVICE CHECK
Date Time Interrogation Session: 20210201001550
Implantable Pulse Generator Implant Date: 20180305

## 2019-02-18 NOTE — Progress Notes (Signed)
ILR Remote 

## 2019-02-27 ENCOUNTER — Ambulatory Visit: Payer: Medicare PPO | Attending: Internal Medicine

## 2019-02-27 DIAGNOSIS — Z23 Encounter for immunization: Secondary | ICD-10-CM | POA: Insufficient documentation

## 2019-02-27 NOTE — Progress Notes (Signed)
   Covid-19 Vaccination Clinic  Name:  [name]    MRN: [id number] DOB: Jun 13, 1945  02/27/2019  [name] was observed post Covid-19 immunization for 15 minutes without incidence. She was provided with Vaccine Information Sheet and instruction to access the V-Safe system.   [name] was instructed to call 911 with any severe reactions post vaccine: [name] Difficulty breathing  . Swelling of your face and throat  . A fast heartbeat  . A bad rash all over your body  . Dizziness and weakness    Immunizations Administered    Name Date Dose VIS Date Route   Pfizer COVID-19 Vaccine 02/27/2019  1:03 PM 0.3 mL 12/28/2018 Intramuscular   Manufacturer: [place]   Lot: [id number]   [place]: [id number]

## 2019-03-12 ENCOUNTER — Encounter (HOSPITAL_COMMUNITY): Payer: Medicare PPO

## 2019-03-12 ENCOUNTER — Other Ambulatory Visit (HOSPITAL_COMMUNITY): Payer: Self-pay | Admitting: *Deleted

## 2019-03-13 ENCOUNTER — Other Ambulatory Visit: Payer: Self-pay

## 2019-03-13 ENCOUNTER — Ambulatory Visit (HOSPITAL_COMMUNITY)
Admission: RE | Admit: 2019-03-13 | Discharge: 2019-03-13 | Disposition: A | Discharge status: Home / Self Care | Payer: Medicare PPO | Source: Ambulatory Visit | Attending: Internal Medicine | Admitting: Internal Medicine

## 2019-03-13 DIAGNOSIS — M810 Age-related osteoporosis without current pathological fracture: Secondary | ICD-10-CM | POA: Insufficient documentation

## 2019-03-13 MED ORDER — DENOSUMAB 60 MG/ML ~~LOC~~ SOSY
PREFILLED_SYRINGE | SUBCUTANEOUS | Status: AC
  Filled 2019-03-13: qty 1

## 2019-03-13 MED ORDER — DENOSUMAB 60 MG/ML ~~LOC~~ SOSY
60.0000 mg | PREFILLED_SYRINGE | Freq: Once | SUBCUTANEOUS | Status: AC
  Administered 2019-03-13: 14:00:00 60 mg via SUBCUTANEOUS

## 2019-03-21 ENCOUNTER — Ambulatory Visit (INDEPENDENT_AMBULATORY_CARE_PROVIDER_SITE_OTHER): Payer: Medicare PPO | Admitting: *Deleted

## 2019-03-21 DIAGNOSIS — Z8673 Personal history of transient ischemic attack (TIA), and cerebral infarction without residual deficits: Secondary | ICD-10-CM

## 2019-03-21 LAB — CUP PACEART REMOTE DEVICE CHECK
Date Time Interrogation Session: 20210304025202
Implantable Pulse Generator Implant Date: 20180305

## 2019-03-21 NOTE — Progress Notes (Signed)
ILR Remote 

## 2019-04-22 ENCOUNTER — Ambulatory Visit (INDEPENDENT_AMBULATORY_CARE_PROVIDER_SITE_OTHER): Payer: Medicare PPO | Admitting: *Deleted

## 2019-04-22 DIAGNOSIS — Z8673 Personal history of transient ischemic attack (TIA), and cerebral infarction without residual deficits: Secondary | ICD-10-CM

## 2019-04-22 LAB — CUP PACEART REMOTE DEVICE CHECK
Date Time Interrogation Session: 20210404035409
Implantable Pulse Generator Implant Date: 20180305

## 2019-04-23 NOTE — Progress Notes (Signed)
ILR Remote 

## 2019-07-01 ENCOUNTER — Ambulatory Visit (INDEPENDENT_AMBULATORY_CARE_PROVIDER_SITE_OTHER): Payer: Medicare PPO | Admitting: *Deleted

## 2019-07-01 DIAGNOSIS — I639 Cerebral infarction, unspecified: Secondary | ICD-10-CM | POA: Diagnosis not present

## 2019-07-01 LAB — CUP PACEART REMOTE DEVICE CHECK
Date Time Interrogation Session: 20210613234058
Implantable Pulse Generator Implant Date: 20180305

## 2019-07-03 NOTE — Progress Notes (Signed)
[facility] Summary Report / Loop Recorder 

## 2019-07-11 DIAGNOSIS — R739 Hyperglycemia, unspecified: Secondary | ICD-10-CM | POA: Diagnosis not present

## 2019-07-11 DIAGNOSIS — E7849 Other hyperlipidemia: Secondary | ICD-10-CM | POA: Diagnosis not present

## 2019-07-11 DIAGNOSIS — E538 Deficiency of other specified B group vitamins: Secondary | ICD-10-CM | POA: Diagnosis not present

## 2019-07-11 DIAGNOSIS — E559 Vitamin D deficiency, unspecified: Secondary | ICD-10-CM | POA: Diagnosis not present

## 2019-07-11 DIAGNOSIS — Z0000 Encounter for general adult medical examination without abnormal findings: Secondary | ICD-10-CM | POA: Diagnosis not present

## 2019-07-18 DIAGNOSIS — H539 Unspecified visual disturbance: Secondary | ICD-10-CM | POA: Diagnosis not present

## 2019-07-18 DIAGNOSIS — I6990 Unspecified sequelae of unspecified cerebrovascular disease: Secondary | ICD-10-CM | POA: Diagnosis not present

## 2019-07-18 DIAGNOSIS — R4701 Aphasia: Secondary | ICD-10-CM | POA: Diagnosis not present

## 2019-07-18 DIAGNOSIS — J439 Emphysema, unspecified: Secondary | ICD-10-CM | POA: Diagnosis not present

## 2019-07-18 DIAGNOSIS — D692 Other nonthrombocytopenic purpura: Secondary | ICD-10-CM | POA: Diagnosis not present

## 2019-07-18 DIAGNOSIS — I63431 Cerebral infarction due to embolism of right posterior cerebral artery: Secondary | ICD-10-CM | POA: Diagnosis not present

## 2019-07-18 DIAGNOSIS — Z1331 Encounter for screening for depression: Secondary | ICD-10-CM | POA: Diagnosis not present

## 2019-07-18 DIAGNOSIS — Z0000 Encounter for general adult medical examination without abnormal findings: Secondary | ICD-10-CM | POA: Diagnosis not present

## 2019-07-18 DIAGNOSIS — E785 Hyperlipidemia, unspecified: Secondary | ICD-10-CM | POA: Diagnosis not present

## 2019-07-18 DIAGNOSIS — D649 Anemia, unspecified: Secondary | ICD-10-CM | POA: Diagnosis not present

## 2019-08-05 ENCOUNTER — Ambulatory Visit (INDEPENDENT_AMBULATORY_CARE_PROVIDER_SITE_OTHER): Payer: Medicare PPO | Admitting: *Deleted

## 2019-08-05 DIAGNOSIS — I639 Cerebral infarction, unspecified: Secondary | ICD-10-CM

## 2019-08-05 LAB — CUP PACEART REMOTE DEVICE CHECK
Date Time Interrogation Session: 20210718232241
Implantable Pulse Generator Implant Date: 20180305

## 2019-08-07 NOTE — Progress Notes (Signed)
[facility] Summary Report / Loop Recorder 

## 2019-08-19 ENCOUNTER — Telehealth: Payer: Self-pay

## 2019-08-19 NOTE — Telephone Encounter (Signed)
Received [employer] alert that patient device has reached RRT as of 08/17/19. Called patient to inform. Discussed with patient options to leave device in or take out of she wishes. Patient states she wants to leave device in. Patient advised to unplugged home monitor box, and I will get a return kit sent to her address to mail back. Address verified with patient.   Advised patient if she has any further questions or concerns to please call [facility] at [phone].

## 2020-01-20 DIAGNOSIS — E46 Unspecified protein-calorie malnutrition: Secondary | ICD-10-CM | POA: Diagnosis not present

## 2020-01-20 DIAGNOSIS — I129 Hypertensive chronic kidney disease with stage 1 through stage 4 chronic kidney disease, or unspecified chronic kidney disease: Secondary | ICD-10-CM | POA: Diagnosis not present

## 2020-01-20 DIAGNOSIS — J449 Chronic obstructive pulmonary disease, unspecified: Secondary | ICD-10-CM | POA: Diagnosis not present

## 2020-01-20 DIAGNOSIS — C029 Malignant neoplasm of tongue, unspecified: Secondary | ICD-10-CM | POA: Diagnosis not present

## 2020-01-20 DIAGNOSIS — F17200 Nicotine dependence, unspecified, uncomplicated: Secondary | ICD-10-CM | POA: Diagnosis not present

## 2020-01-20 DIAGNOSIS — J439 Emphysema, unspecified: Secondary | ICD-10-CM | POA: Diagnosis not present

## 2020-01-20 DIAGNOSIS — N1831 Chronic kidney disease, stage 3a: Secondary | ICD-10-CM | POA: Diagnosis not present

## 2020-01-20 DIAGNOSIS — R809 Proteinuria, unspecified: Secondary | ICD-10-CM | POA: Diagnosis not present

## 2020-01-20 DIAGNOSIS — R627 Adult failure to thrive: Secondary | ICD-10-CM | POA: Diagnosis not present

## 2020-02-04 ENCOUNTER — Encounter (HOSPITAL_COMMUNITY): Payer: Medicare PPO

## 2020-03-11 ENCOUNTER — Other Ambulatory Visit (HOSPITAL_COMMUNITY): Payer: Self-pay | Admitting: *Deleted

## 2020-03-12 ENCOUNTER — Other Ambulatory Visit: Payer: Self-pay

## 2020-03-12 ENCOUNTER — Encounter (HOSPITAL_COMMUNITY)
Admission: RE | Admit: 2020-03-12 | Discharge: 2020-03-12 | Disposition: A | Discharge status: Home / Self Care | Payer: Medicare PPO | Source: Ambulatory Visit | Attending: Internal Medicine | Admitting: Internal Medicine

## 2020-03-12 DIAGNOSIS — M810 Age-related osteoporosis without current pathological fracture: Secondary | ICD-10-CM | POA: Insufficient documentation

## 2020-03-12 MED ORDER — DENOSUMAB 60 MG/ML ~~LOC~~ SOSY
60.0000 mg | PREFILLED_SYRINGE | Freq: Once | SUBCUTANEOUS | Status: AC
  Administered 2020-03-12: 60 mg via SUBCUTANEOUS

## 2020-07-14 DIAGNOSIS — R739 Hyperglycemia, unspecified: Secondary | ICD-10-CM | POA: Diagnosis not present

## 2020-07-14 DIAGNOSIS — E538 Deficiency of other specified B group vitamins: Secondary | ICD-10-CM | POA: Diagnosis not present

## 2020-07-14 DIAGNOSIS — D649 Anemia, unspecified: Secondary | ICD-10-CM | POA: Diagnosis not present

## 2020-07-14 DIAGNOSIS — E559 Vitamin D deficiency, unspecified: Secondary | ICD-10-CM | POA: Diagnosis not present

## 2020-07-14 DIAGNOSIS — E785 Hyperlipidemia, unspecified: Secondary | ICD-10-CM | POA: Diagnosis not present

## 2020-07-21 DIAGNOSIS — J439 Emphysema, unspecified: Secondary | ICD-10-CM | POA: Diagnosis not present

## 2020-07-21 DIAGNOSIS — J449 Chronic obstructive pulmonary disease, unspecified: Secondary | ICD-10-CM | POA: Diagnosis not present

## 2020-07-21 DIAGNOSIS — I129 Hypertensive chronic kidney disease with stage 1 through stage 4 chronic kidney disease, or unspecified chronic kidney disease: Secondary | ICD-10-CM | POA: Diagnosis not present

## 2020-07-21 DIAGNOSIS — C029 Malignant neoplasm of tongue, unspecified: Secondary | ICD-10-CM | POA: Diagnosis not present

## 2020-07-21 DIAGNOSIS — N1831 Chronic kidney disease, stage 3a: Secondary | ICD-10-CM | POA: Diagnosis not present

## 2020-07-21 DIAGNOSIS — E46 Unspecified protein-calorie malnutrition: Secondary | ICD-10-CM | POA: Diagnosis not present

## 2020-07-21 DIAGNOSIS — Z0000 Encounter for general adult medical examination without abnormal findings: Secondary | ICD-10-CM | POA: Diagnosis not present

## 2020-07-21 DIAGNOSIS — E785 Hyperlipidemia, unspecified: Secondary | ICD-10-CM | POA: Diagnosis not present

## 2020-07-21 DIAGNOSIS — R82998 Other abnormal findings in urine: Secondary | ICD-10-CM | POA: Diagnosis not present

## 2020-07-21 DIAGNOSIS — I700 Atherosclerosis of aorta: Secondary | ICD-10-CM | POA: Diagnosis not present

## 2020-08-21 DIAGNOSIS — Z1212 Encounter for screening for malignant neoplasm of rectum: Secondary | ICD-10-CM | POA: Diagnosis not present

## 2020-09-14 ENCOUNTER — Encounter (HOSPITAL_COMMUNITY): Payer: Medicare PPO

## 2021-01-26 DIAGNOSIS — R2689 Other abnormalities of gait and mobility: Secondary | ICD-10-CM | POA: Diagnosis not present

## 2021-01-26 DIAGNOSIS — C029 Malignant neoplasm of tongue, unspecified: Secondary | ICD-10-CM | POA: Diagnosis not present

## 2021-01-26 DIAGNOSIS — D692 Other nonthrombocytopenic purpura: Secondary | ICD-10-CM | POA: Diagnosis not present

## 2021-01-26 DIAGNOSIS — M810 Age-related osteoporosis without current pathological fracture: Secondary | ICD-10-CM | POA: Diagnosis not present

## 2021-01-26 DIAGNOSIS — N1831 Chronic kidney disease, stage 3a: Secondary | ICD-10-CM | POA: Diagnosis not present

## 2021-01-26 DIAGNOSIS — D509 Iron deficiency anemia, unspecified: Secondary | ICD-10-CM | POA: Diagnosis not present

## 2021-01-26 DIAGNOSIS — E538 Deficiency of other specified B group vitamins: Secondary | ICD-10-CM | POA: Diagnosis not present

## 2021-01-26 DIAGNOSIS — E785 Hyperlipidemia, unspecified: Secondary | ICD-10-CM | POA: Diagnosis not present

## 2021-01-26 DIAGNOSIS — J449 Chronic obstructive pulmonary disease, unspecified: Secondary | ICD-10-CM | POA: Diagnosis not present

## 2021-01-26 DIAGNOSIS — I129 Hypertensive chronic kidney disease with stage 1 through stage 4 chronic kidney disease, or unspecified chronic kidney disease: Secondary | ICD-10-CM | POA: Diagnosis not present

## 2021-07-22 DIAGNOSIS — I6990 Unspecified sequelae of unspecified cerebrovascular disease: Secondary | ICD-10-CM | POA: Diagnosis not present

## 2021-07-22 DIAGNOSIS — Z1331 Encounter for screening for depression: Secondary | ICD-10-CM | POA: Diagnosis not present

## 2021-07-22 DIAGNOSIS — E46 Unspecified protein-calorie malnutrition: Secondary | ICD-10-CM | POA: Diagnosis not present

## 2021-07-22 DIAGNOSIS — R82998 Other abnormal findings in urine: Secondary | ICD-10-CM | POA: Diagnosis not present

## 2021-07-22 DIAGNOSIS — N1831 Chronic kidney disease, stage 3a: Secondary | ICD-10-CM | POA: Diagnosis not present

## 2021-07-22 DIAGNOSIS — Z1389 Encounter for screening for other disorder: Secondary | ICD-10-CM | POA: Diagnosis not present

## 2021-07-22 DIAGNOSIS — J439 Emphysema, unspecified: Secondary | ICD-10-CM | POA: Diagnosis not present

## 2021-07-22 DIAGNOSIS — E538 Deficiency of other specified B group vitamins: Secondary | ICD-10-CM | POA: Diagnosis not present

## 2021-07-22 DIAGNOSIS — E559 Vitamin D deficiency, unspecified: Secondary | ICD-10-CM | POA: Diagnosis not present

## 2021-07-22 DIAGNOSIS — Z0000 Encounter for general adult medical examination without abnormal findings: Secondary | ICD-10-CM | POA: Diagnosis not present

## 2021-07-22 DIAGNOSIS — I700 Atherosclerosis of aorta: Secondary | ICD-10-CM | POA: Diagnosis not present

## 2021-07-22 DIAGNOSIS — I739 Peripheral vascular disease, unspecified: Secondary | ICD-10-CM | POA: Diagnosis not present

## 2021-07-22 DIAGNOSIS — R739 Hyperglycemia, unspecified: Secondary | ICD-10-CM | POA: Diagnosis not present

## 2021-07-22 DIAGNOSIS — I129 Hypertensive chronic kidney disease with stage 1 through stage 4 chronic kidney disease, or unspecified chronic kidney disease: Secondary | ICD-10-CM | POA: Diagnosis not present

## 2021-07-22 DIAGNOSIS — E785 Hyperlipidemia, unspecified: Secondary | ICD-10-CM | POA: Diagnosis not present

## 2021-07-22 DIAGNOSIS — D509 Iron deficiency anemia, unspecified: Secondary | ICD-10-CM | POA: Diagnosis not present

## 2021-07-23 DIAGNOSIS — E538 Deficiency of other specified B group vitamins: Secondary | ICD-10-CM | POA: Diagnosis not present

## 2021-07-23 DIAGNOSIS — Z0000 Encounter for general adult medical examination without abnormal findings: Secondary | ICD-10-CM | POA: Diagnosis not present

## 2021-07-26 ENCOUNTER — Other Ambulatory Visit: Payer: Self-pay | Admitting: Physician Assistant

## 2021-07-26 DIAGNOSIS — R195 Other fecal abnormalities: Secondary | ICD-10-CM | POA: Diagnosis not present

## 2021-07-26 DIAGNOSIS — D509 Iron deficiency anemia, unspecified: Secondary | ICD-10-CM | POA: Diagnosis not present

## 2021-07-26 DIAGNOSIS — R634 Abnormal weight loss: Secondary | ICD-10-CM | POA: Diagnosis not present

## 2021-07-26 DIAGNOSIS — R1032 Left lower quadrant pain: Secondary | ICD-10-CM | POA: Diagnosis not present

## 2021-07-26 DIAGNOSIS — Z85810 Personal history of malignant neoplasm of tongue: Secondary | ICD-10-CM | POA: Diagnosis not present

## 2021-07-26 DIAGNOSIS — Z8673 Personal history of transient ischemic attack (TIA), and cerebral infarction without residual deficits: Secondary | ICD-10-CM | POA: Diagnosis not present

## 2021-07-26 DIAGNOSIS — Z8719 Personal history of other diseases of the digestive system: Secondary | ICD-10-CM | POA: Diagnosis not present

## 2021-07-26 DIAGNOSIS — Z86010 Personal history of colonic polyps: Secondary | ICD-10-CM | POA: Diagnosis not present

## 2021-07-26 DIAGNOSIS — Z800 Family history of malignant neoplasm of digestive organs: Secondary | ICD-10-CM | POA: Diagnosis not present

## 2021-08-06 ENCOUNTER — Inpatient Hospital Stay: Admission: RE | Admit: 2021-08-06 | Payer: Medicare PPO | Source: Ambulatory Visit

## 2021-08-06 ENCOUNTER — Ambulatory Visit
Admission: RE | Admit: 2021-08-06 | Discharge: 2021-08-06 | Disposition: A | Discharge status: Home / Self Care | Payer: Medicare PPO | Source: Ambulatory Visit | Attending: Physician Assistant | Admitting: Physician Assistant

## 2021-08-06 DIAGNOSIS — I700 Atherosclerosis of aorta: Secondary | ICD-10-CM | POA: Diagnosis not present

## 2021-08-06 DIAGNOSIS — R1032 Left lower quadrant pain: Secondary | ICD-10-CM

## 2021-08-06 DIAGNOSIS — D7389 Other diseases of spleen: Secondary | ICD-10-CM | POA: Diagnosis not present

## 2021-08-06 DIAGNOSIS — K8689 Other specified diseases of pancreas: Secondary | ICD-10-CM | POA: Diagnosis not present

## 2021-08-06 DIAGNOSIS — N2889 Other specified disorders of kidney and ureter: Secondary | ICD-10-CM | POA: Diagnosis not present

## 2021-08-06 MED ORDER — IOPAMIDOL (ISOVUE-300) INJECTION 61%
100.0000 mL | Freq: Once | INTRAVENOUS | Status: AC | PRN
  Administered 2021-08-06: 100 mL via INTRAVENOUS

## 2021-08-13 ENCOUNTER — Other Ambulatory Visit: Payer: Self-pay

## 2021-08-13 ENCOUNTER — Inpatient Hospital Stay (HOSPITAL_COMMUNITY)
Admission: EM | Admit: 2021-08-13 | Discharge: 2021-08-18 | DRG: 378 | Disposition: A | Discharge status: Home / Self Care | Payer: Medicare PPO | Attending: Internal Medicine | Admitting: Internal Medicine

## 2021-08-13 ENCOUNTER — Encounter (HOSPITAL_COMMUNITY): Payer: Self-pay | Admitting: Emergency Medicine

## 2021-08-13 DIAGNOSIS — Q2733 Arteriovenous malformation of digestive system vessel: Secondary | ICD-10-CM | POA: Diagnosis not present

## 2021-08-13 DIAGNOSIS — R58 Hemorrhage, not elsewhere classified: Secondary | ICD-10-CM | POA: Diagnosis not present

## 2021-08-13 DIAGNOSIS — D649 Anemia, unspecified: Secondary | ICD-10-CM | POA: Diagnosis not present

## 2021-08-13 DIAGNOSIS — K5521 Angiodysplasia of colon with hemorrhage: Principal | ICD-10-CM | POA: Diagnosis present

## 2021-08-13 DIAGNOSIS — Z7951 Long term (current) use of inhaled steroids: Secondary | ICD-10-CM | POA: Diagnosis not present

## 2021-08-13 DIAGNOSIS — F419 Anxiety disorder, unspecified: Secondary | ICD-10-CM | POA: Diagnosis present

## 2021-08-13 DIAGNOSIS — K5730 Diverticulosis of large intestine without perforation or abscess without bleeding: Secondary | ICD-10-CM | POA: Diagnosis not present

## 2021-08-13 DIAGNOSIS — Z79899 Other long term (current) drug therapy: Secondary | ICD-10-CM

## 2021-08-13 DIAGNOSIS — R252 Cramp and spasm: Secondary | ICD-10-CM | POA: Diagnosis present

## 2021-08-13 DIAGNOSIS — N179 Acute kidney failure, unspecified: Secondary | ICD-10-CM | POA: Diagnosis not present

## 2021-08-13 DIAGNOSIS — Z8249 Family history of ischemic heart disease and other diseases of the circulatory system: Secondary | ICD-10-CM

## 2021-08-13 DIAGNOSIS — Z823 Family history of stroke: Secondary | ICD-10-CM

## 2021-08-13 DIAGNOSIS — Z87891 Personal history of nicotine dependence: Secondary | ICD-10-CM

## 2021-08-13 DIAGNOSIS — R42 Dizziness and giddiness: Secondary | ICD-10-CM | POA: Diagnosis not present

## 2021-08-13 DIAGNOSIS — K922 Gastrointestinal hemorrhage, unspecified: Secondary | ICD-10-CM

## 2021-08-13 DIAGNOSIS — Z8719 Personal history of other diseases of the digestive system: Secondary | ICD-10-CM

## 2021-08-13 DIAGNOSIS — E861 Hypovolemia: Secondary | ICD-10-CM | POA: Diagnosis present

## 2021-08-13 DIAGNOSIS — I10 Essential (primary) hypertension: Secondary | ICD-10-CM | POA: Diagnosis present

## 2021-08-13 DIAGNOSIS — K621 Rectal polyp: Secondary | ICD-10-CM | POA: Diagnosis not present

## 2021-08-13 DIAGNOSIS — K31819 Angiodysplasia of stomach and duodenum without bleeding: Secondary | ICD-10-CM | POA: Diagnosis not present

## 2021-08-13 DIAGNOSIS — K635 Polyp of colon: Secondary | ICD-10-CM | POA: Diagnosis present

## 2021-08-13 DIAGNOSIS — I739 Peripheral vascular disease, unspecified: Secondary | ICD-10-CM | POA: Diagnosis not present

## 2021-08-13 DIAGNOSIS — K648 Other hemorrhoids: Secondary | ICD-10-CM | POA: Diagnosis not present

## 2021-08-13 DIAGNOSIS — K219 Gastro-esophageal reflux disease without esophagitis: Secondary | ICD-10-CM | POA: Diagnosis present

## 2021-08-13 DIAGNOSIS — R231 Pallor: Secondary | ICD-10-CM | POA: Diagnosis not present

## 2021-08-13 DIAGNOSIS — Z7902 Long term (current) use of antithrombotics/antiplatelets: Secondary | ICD-10-CM | POA: Diagnosis not present

## 2021-08-13 DIAGNOSIS — R195 Other fecal abnormalities: Secondary | ICD-10-CM | POA: Diagnosis not present

## 2021-08-13 DIAGNOSIS — K625 Hemorrhage of anus and rectum: Secondary | ICD-10-CM | POA: Diagnosis not present

## 2021-08-13 DIAGNOSIS — E860 Dehydration: Secondary | ICD-10-CM | POA: Diagnosis not present

## 2021-08-13 DIAGNOSIS — Z8673 Personal history of transient ischemic attack (TIA), and cerebral infarction without residual deficits: Secondary | ICD-10-CM

## 2021-08-13 DIAGNOSIS — K31811 Angiodysplasia of stomach and duodenum with bleeding: Secondary | ICD-10-CM | POA: Diagnosis present

## 2021-08-13 DIAGNOSIS — D62 Acute posthemorrhagic anemia: Secondary | ICD-10-CM | POA: Diagnosis not present

## 2021-08-13 DIAGNOSIS — Z881 Allergy status to other antibiotic agents status: Secondary | ICD-10-CM

## 2021-08-13 DIAGNOSIS — D500 Iron deficiency anemia secondary to blood loss (chronic): Secondary | ICD-10-CM | POA: Diagnosis not present

## 2021-08-13 DIAGNOSIS — E872 Acidosis, unspecified: Secondary | ICD-10-CM | POA: Diagnosis present

## 2021-08-13 DIAGNOSIS — J45909 Unspecified asthma, uncomplicated: Secondary | ICD-10-CM | POA: Diagnosis not present

## 2021-08-13 DIAGNOSIS — F32A Depression, unspecified: Secondary | ICD-10-CM | POA: Diagnosis present

## 2021-08-13 DIAGNOSIS — Z885 Allergy status to narcotic agent status: Secondary | ICD-10-CM

## 2021-08-13 DIAGNOSIS — Z7901 Long term (current) use of anticoagulants: Secondary | ICD-10-CM | POA: Diagnosis not present

## 2021-08-13 DIAGNOSIS — K5520 Angiodysplasia of colon without hemorrhage: Secondary | ICD-10-CM | POA: Diagnosis not present

## 2021-08-13 DIAGNOSIS — D123 Benign neoplasm of transverse colon: Secondary | ICD-10-CM | POA: Diagnosis not present

## 2021-08-13 DIAGNOSIS — Z800 Family history of malignant neoplasm of digestive organs: Secondary | ICD-10-CM

## 2021-08-13 DIAGNOSIS — G4730 Sleep apnea, unspecified: Secondary | ICD-10-CM | POA: Diagnosis present

## 2021-08-13 DIAGNOSIS — I959 Hypotension, unspecified: Secondary | ICD-10-CM | POA: Diagnosis not present

## 2021-08-13 DIAGNOSIS — E785 Hyperlipidemia, unspecified: Secondary | ICD-10-CM | POA: Diagnosis not present

## 2021-08-13 DIAGNOSIS — Z85810 Personal history of malignant neoplasm of tongue: Secondary | ICD-10-CM

## 2021-08-13 LAB — CBC WITH DIFFERENTIAL/PLATELET
Abs Immature Granulocytes: 0.09 10*3/uL — ABNORMAL HIGH (ref 0.00–0.07)
Abs Immature Granulocytes: 0.06 10*3/uL (ref 0.00–0.07)
Basophils Absolute: 0.1 10*3/uL (ref 0.0–0.1)
Basophils Absolute: 0 10*3/uL (ref 0.0–0.1)
Basophils Relative: 1 %
Basophils Relative: 1 %
Eosinophils Absolute: 0.2 10*3/uL (ref 0.0–0.5)
Eosinophils Absolute: 0 10*3/uL (ref 0.0–0.5)
Eosinophils Relative: 2 %
Eosinophils Relative: 0 %
HCT: 20.7 % — ABNORMAL LOW (ref 36.0–46.0)
HCT: 21.9 % — ABNORMAL LOW (ref 36.0–46.0)
Hemoglobin: 6.2 g/dL — CL (ref 12.0–15.0)
Hemoglobin: 7.1 g/dL — ABNORMAL LOW (ref 12.0–15.0)
Immature Granulocytes: 1 %
Immature Granulocytes: 1 %
Lymphocytes Relative: 19 %
Lymphocytes Relative: 19 %
Lymphs Abs: 1.8 10*3/uL (ref 0.7–4.0)
Lymphs Abs: 1.7 10*3/uL (ref 0.7–4.0)
MCH: 29.5 pg (ref 26.0–34.0)
MCH: 29.8 pg (ref 26.0–34.0)
MCHC: 30 g/dL (ref 30.0–36.0)
MCHC: 32.4 g/dL (ref 30.0–36.0)
MCV: 98.6 fL (ref 80.0–100.0)
MCV: 92 fL (ref 80.0–100.0)
Monocytes Absolute: 0.7 10*3/uL (ref 0.1–1.0)
Monocytes Absolute: 0.6 10*3/uL (ref 0.1–1.0)
Monocytes Relative: 7 %
Monocytes Relative: 6 %
Neutro Abs: 7.1 10*3/uL (ref 1.7–7.7)
Neutro Abs: 6.5 10*3/uL (ref 1.7–7.7)
Neutrophils Relative %: 70 %
Neutrophils Relative %: 73 %
Platelets: 285 10*3/uL (ref 150–400)
Platelets: 172 10*3/uL (ref 150–400)
RBC: 2.1 MIL/uL — ABNORMAL LOW (ref 3.87–5.11)
RBC: 2.38 MIL/uL — ABNORMAL LOW (ref 3.87–5.11)
RDW: 13.2 % (ref 11.5–15.5)
RDW: 14.2 % (ref 11.5–15.5)
WBC: 9.9 10*3/uL (ref 4.0–10.5)
WBC: 8.9 10*3/uL (ref 4.0–10.5)
nRBC: 0 % (ref 0.0–0.2)
nRBC: 0 % (ref 0.0–0.2)

## 2021-08-13 LAB — TYPE AND SCREEN: Unit division: 0

## 2021-08-13 LAB — COMPREHENSIVE METABOLIC PANEL
ALT: 12 U/L (ref 0–44)
ALT: 11 U/L (ref 0–44)
AST: 17 U/L (ref 15–41)
AST: 14 U/L — ABNORMAL LOW (ref 15–41)
Albumin: 3 g/dL — ABNORMAL LOW (ref 3.5–5.0)
Albumin: 2.4 g/dL — ABNORMAL LOW (ref 3.5–5.0)
Alkaline Phosphatase: 60 U/L (ref 38–126)
Alkaline Phosphatase: 48 U/L (ref 38–126)
Anion gap: 11 (ref 5–15)
Anion gap: 4 — ABNORMAL LOW (ref 5–15)
BUN: 28 mg/dL — ABNORMAL HIGH (ref 8–23)
BUN: 24 mg/dL — ABNORMAL HIGH (ref 8–23)
CO2: 17 mmol/L — ABNORMAL LOW (ref 22–32)
CO2: 23 mmol/L (ref 22–32)
Calcium: 8.1 mg/dL — ABNORMAL LOW (ref 8.9–10.3)
Calcium: 7.8 mg/dL — ABNORMAL LOW (ref 8.9–10.3)
Chloride: 108 mmol/L (ref 98–111)
Chloride: 112 mmol/L — ABNORMAL HIGH (ref 98–111)
Creatinine, Ser: 1.09 mg/dL — ABNORMAL HIGH (ref 0.44–1.00)
Creatinine, Ser: 1.13 mg/dL — ABNORMAL HIGH (ref 0.44–1.00)
GFR, Estimated: 52 mL/min — ABNORMAL LOW (ref >60)
GFR, Estimated: 50 mL/min — ABNORMAL LOW (ref >60)
Glucose, Bld: 170 mg/dL — ABNORMAL HIGH (ref 70–99)
Glucose, Bld: 125 mg/dL — ABNORMAL HIGH (ref 70–99)
Potassium: 3.8 mmol/L (ref 3.5–5.1)
Potassium: 4.6 mmol/L (ref 3.5–5.1)
Sodium: 136 mmol/L (ref 135–145)
Sodium: 139 mmol/L (ref 135–145)
Total Bilirubin: 0.3 mg/dL (ref 0.3–1.2)
Total Bilirubin: 0.4 mg/dL (ref 0.3–1.2)
Total Protein: 5 g/dL — ABNORMAL LOW (ref 6.5–8.1)
Total Protein: 3.8 g/dL — ABNORMAL LOW (ref 6.5–8.1)

## 2021-08-13 LAB — HEMOGLOBIN AND HEMATOCRIT, BLOOD
HCT: 17.5 % — ABNORMAL LOW (ref 36.0–46.0)
Hemoglobin: 5.6 g/dL — CL (ref 12.0–15.0)

## 2021-08-13 LAB — PREPARE RBC (CROSSMATCH)

## 2021-08-13 LAB — PROTIME-INR
INR: 1.2 (ref 0.8–1.2)
Prothrombin Time: 14.9 seconds (ref 11.4–15.2)

## 2021-08-13 LAB — MAGNESIUM: Magnesium: 1.6 mg/dL — ABNORMAL LOW (ref 1.7–2.4)

## 2021-08-13 LAB — PHOSPHORUS: Phosphorus: 4.1 mg/dL (ref 2.5–4.6)

## 2021-08-13 MED ORDER — GABAPENTIN 100 MG PO CAPS
100.0000 mg | ORAL_CAPSULE | ORAL | Status: AC
  Administered 2021-08-13: 100 mg via ORAL
  Filled 2021-08-13: qty 1

## 2021-08-13 MED ORDER — ATORVASTATIN CALCIUM 10 MG PO TABS
10.0000 mg | ORAL_TABLET | Freq: Every day | ORAL | Status: DC
  Administered 2021-08-13 – 2021-08-18 (×6): 10 mg via ORAL
  Filled 2021-08-13 (×6): qty 1

## 2021-08-13 MED ORDER — GABAPENTIN 100 MG PO CAPS: 200.0000 mg | ORAL_CAPSULE | ORAL | Status: DC

## 2021-08-13 MED ORDER — PROCHLORPERAZINE EDISYLATE 10 MG/2ML IJ SOLN: 10.0000 mg | Freq: Four times a day (QID) | INTRAMUSCULAR | Status: DC | PRN

## 2021-08-13 MED ORDER — PANTOPRAZOLE 80MG IVPB - SIMPLE MED
80.0000 mg | Freq: Once | INTRAVENOUS | Status: AC
  Administered 2021-08-13: 80 mg via INTRAVENOUS
  Filled 2021-08-13: qty 80

## 2021-08-13 MED ORDER — SODIUM CHLORIDE 0.9% IV SOLUTION
Freq: Once | INTRAVENOUS | Status: AC
Start: 2021-08-13 — End: 2021-08-13

## 2021-08-13 MED ORDER — VITAMIN B-12 1000 MCG PO TABS
1000.0000 ug | ORAL_TABLET | Freq: Every day | ORAL | Status: DC
  Administered 2021-08-13 – 2021-08-18 (×6): 1000 ug via ORAL
  Filled 2021-08-13 (×6): qty 1

## 2021-08-13 MED ORDER — LACTATED RINGERS IV BOLUS
1000.0000 mL | Freq: Once | INTRAVENOUS | Status: AC
  Administered 2021-08-13: 1000 mL via INTRAVENOUS

## 2021-08-13 MED ORDER — LACTATED RINGERS IV SOLN: INTRAVENOUS | Status: AC

## 2021-08-13 MED ORDER — ACETAMINOPHEN 325 MG PO TABS
650.0000 mg | ORAL_TABLET | Freq: Four times a day (QID) | ORAL | Status: DC | PRN
  Administered 2021-08-13: 650 mg via ORAL
  Filled 2021-08-13: qty 2

## 2021-08-13 MED ORDER — MOMETASONE FURO-FORMOTEROL FUM 100-5 MCG/ACT IN AERO
2.0000 | INHALATION_SPRAY | Freq: Two times a day (BID) | RESPIRATORY_TRACT | Status: DC
  Administered 2021-08-13 – 2021-08-18 (×8): 2 via RESPIRATORY_TRACT
  Filled 2021-08-13: qty 8.8

## 2021-08-13 MED ORDER — GABAPENTIN 100 MG PO CAPS
100.0000 mg | ORAL_CAPSULE | ORAL | Status: AC
Start: 2021-08-13 — End: 2021-08-13
  Administered 2021-08-13: 100 mg via ORAL
  Filled 2021-08-13: qty 1

## 2021-08-13 MED ORDER — ESCITALOPRAM OXALATE 20 MG PO TABS
20.0000 mg | ORAL_TABLET | Freq: Every day | ORAL | Status: DC
  Administered 2021-08-13 – 2021-08-18 (×6): 20 mg via ORAL
  Filled 2021-08-13: qty 1
  Filled 2021-08-13: qty 2
  Filled 2021-08-13 (×4): qty 1

## 2021-08-13 MED ORDER — PANTOPRAZOLE INFUSION (NEW) - SIMPLE MED
8.0000 mg/h | INTRAVENOUS | Status: AC
  Administered 2021-08-13 – 2021-08-15 (×7): 8 mg/h via INTRAVENOUS
  Filled 2021-08-13 (×7): qty 80

## 2021-08-13 MED ORDER — HYDROXYZINE HCL 10 MG PO TABS
10.0000 mg | ORAL_TABLET | Freq: Once | ORAL | Status: AC
  Administered 2021-08-13: 10 mg via ORAL
  Filled 2021-08-13: qty 1

## 2021-08-13 MED ORDER — SODIUM CHLORIDE 0.9 % IV SOLN: 10.0000 mL/h | Freq: Once | INTRAVENOUS | Status: DC

## 2021-08-13 MED ORDER — LACTATED RINGERS IV BOLUS
250.0000 mL | Freq: Once | INTRAVENOUS | Status: AC
Start: 2021-08-13 — End: 2021-08-13
  Administered 2021-08-13: 250 mL via INTRAVENOUS

## 2021-08-13 NOTE — Plan of Care (Signed)

## 2021-08-13 NOTE — Hospital Course (Signed)
76 y.o. female with medical history significant for acute gastritis treated with argon plasma coagulation in 2020, prior CVA on Plavix, post loop recorder placement, asthma, chronic anxiety/depression, who presented to [facility] ED with complaints of sudden onset painless rectal bleeding associated with 1 episode of emesis on the day of presentation.  No abdominal pain.  No subjective fevers.  Last dose of home Plavix was taken on 08/12/21 AM.   Upon presentation to the ED, lab studies revealed significant anemia with hemoglobin of 6.2 from 7.6 at baseline.  2 units PRBCs ordered to be transfused by EDP.  Home Plavix held, EDP consulted [facility]. Watt Climes.  TRH, hospitalist service, was asked to admit

## 2021-08-13 NOTE — Progress Notes (Signed)
Pt stated she had only urinated once today; bladder scan showed 566 ml retained. Notified MD.

## 2021-08-13 NOTE — Consult Note (Signed)
Referring Provider: Dr. Wyline Copas Primary Care Physician:  Shon Baton, MD Primary Gastroenterologist:  Dr. Cristina Gong  Reason for Consultation:  GI bleed  HPI: [name] is a 76 y.o. female  with acute onset of nonbloody emesis following drinking ginger ale last night and then while sleeping having the urge to defecate and having 2 episodes of red blood mixed with partially formed stools early this morning. Denies any associated abdominal pain. Felt dizzy and lightheaded. Had a moderate amount of dark red stool in the bedpan per nursing this morning. Denies NSAIDs. On Plavix for hx of CVA. 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 Colonoscopy by Dr. Cristina Gong in 05/2014 that showed sigmoid diverticulosis, 2 cecal AVMs and adenomatous polyps. Hgb 6.2.  Past Medical History:  Diagnosis Date   Asthma    Cancer ([place])    Depression    Hypertension    Peripheral vascular disease ([place])    Pneumonia    Sleep apnea    Stroke [facility])     Past Surgical History:  Procedure Laterality Date   DIRECT LARYNGOSCOPY Left 06/29/2016   Procedure: WIDE EXCISION LEFT TONGUE CANCER DIRECT LARYNGOSCOPY;  Surgeon: [name], MD;  Location: [facility] OR;  Service: ENT;  Laterality: Left;   ESOPHAGOGASTRODUODENOSCOPY (EGD) WITH PROPOFOL Left 07/12/2018   Procedure: ESOPHAGOGASTRODUODENOSCOPY (EGD) WITH PROPOFOL;  Surgeon: Wilford Corner, MD;  Location: [place];  Service: Endoscopy;  Laterality: Left;   ESOPHAGOSCOPY N/A 06/29/2016   Procedure: ESOPHAGOSCOPY/BRONCHOSCOPY;  Surgeon: [name], MD;  Location: [place];  Service: ENT;  Laterality: N/A;   EYE SURGERY Bilateral 2016   HOT HEMOSTASIS N/A 07/12/2018   Procedure: HOT HEMOSTASIS (ARGON PLASMA COAGULATION/BICAP);  Surgeon: Wilford Corner, MD;  Location: [place];  Service: Endoscopy;  Laterality: N/A;   KNEE SURGERY Right 2014   LOOP RECORDER INSERTION N/A 03/21/2016   Procedure: Loop Recorder Insertion;  Surgeon: Will Meredith Leeds, MD;  Location: [place] [facility];  Service: Cardiovascular;  Laterality: N/A;   PARS PLANA VITRECTOMY Right 02/28/2014   Procedure: PARS PLANA VITRECTOMY 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 WITH TAP AND ANTIBIOTIC INJECTION;  Surgeon: Hurman Horn, MD;  Location: [place];  Service: Ophthalmology;  Laterality: Right;   TEE WITHOUT CARDIOVERSION N/A 03/21/2016   Procedure: TRANSESOPHAGEAL ECHOCARDIOGRAM (TEE);  Surgeon: Skeet Latch, MD;  Location: [facility] ENDOSCOPY;  Service: Cardiovascular;  Laterality: N/A;    Prior to Admission medications   Medication Sig Start Date End Date Taking? Authorizing Provider  Albuterol Sulfate 108 (90 Base) MCG/ACT AEPB Inhale 2 puffs into the lungs every 6 (six) hours as needed for shortness of breath. 04/07/18  Yes [provider]  amLODipine (NORVASC) 10 MG tablet Take 1 tablet (10 mg total) by mouth daily. 05/09/18  Yes Barton Dubois, MD  atorvastatin (LIPITOR) 10 MG tablet Take 10 mg by mouth daily.  03/16/16  Yes [provider]  Cholecalciferol (VITAMIN D-3) 25 MCG (1000 UT) CAPS Take 1,000 Units by mouth daily.   Yes [provider]  clopidogrel (PLAVIX) 75 MG tablet Take 1 tablet (75 mg total) by mouth daily. 07/17/18  Yes Gherghe, Vella Redhead, MD  escitalopram (LEXAPRO) 20 MG tablet Take 20 mg by mouth daily.  07/10/14  Yes [provider]  ferrous sulfate 325 (65 FE) MG tablet Take 1 tablet (325 mg total) by mouth daily with breakfast. 07/13/18  Yes Gherghe, Vella Redhead, MD  losartan (COZAAR) 25 MG tablet Take 1 tablet (25 mg total) by mouth daily.  05/10/18  Yes Barton Dubois, MD  pantoprazole (PROTONIX) 40 MG tablet Take 1 tablet (40 mg total) by mouth daily. 07/13/18  Yes Gherghe, Vella Redhead, MD  vitamin B-12 (CYANOCOBALAMIN) 1000 MCG tablet Take 1,000 mcg by mouth daily.   Yes [provider]    Scheduled Meds:  atorvastatin  10 mg Oral Daily   cyanocobalamin  1,000 mcg Oral Daily   escitalopram  20 mg Oral Daily    mometasone-formoterol  2 puff Inhalation BID   Continuous Infusions:  sodium chloride Stopped (08/13/21 0732)   lactated ringers 50 mL/hr at 08/13/21 0315   pantoprazole 8 mg/hr (08/13/21 0319)   PRN Meds:.acetaminophen, prochlorperazine  Allergies as of 08/13/2021 - Review Complete 08/13/2021  Allergen Reaction Noted   Codeine Itching and Other (See Comments) 05/19/2011   Neosporin [neomycin-bacitracin zn-polymyx] Itching and Swelling 03/21/2016   Percocet [oxycodone-acetaminophen] Other (See Comments) 04/02/2009   Tussionex pennkinetic er [hydrocod poli-chlorphe poli er] Itching 04/02/2009    Family History  Problem Relation Age of Onset   Cancer Mother        colon   Stroke Father    Heart disease Brother     Social History   Socioeconomic History   Marital status: Single    Spouse name: Not on file   Number of children: Not on file   Years of education: Not on file   Highest education level: Not on file  Occupational History   Not on file  Tobacco Use   Smoking status: Former    Types: Cigarettes    Quit date: 05/29/2016    Years since quitting: 5.2   Smokeless tobacco: Never  Vaping Use   Vaping Use: Former  Substance and Sexual Activity   Alcohol use: No   Drug use: No   Sexual activity: Not on file  Other Topics Concern   Not on file  Social History Narrative   Not on file   Social Determinants of Health   Financial Resource Strain: Not on file  Food Insecurity: Not on file  Transportation Needs: Not on file  Physical Activity: Not on file  Stress: Not on file  Social Connections: Not on file  Intimate Partner Violence: Not on file    Review of Systems: All negative except as stated above in HPI.  Physical Exam: Vital signs: Vitals:   08/13/21 0700 08/13/21 0934  BP: (!) 120/52 112/76  Pulse: 76 85  Resp: 13 16  Temp:  (!) 97.5 F (36.4 C)  SpO2: 97%      General: Lethargic, elderly, thin, no acute distress, pleasant Head:  normocephalic, atraumatic Eyes: anicteric sclera ENT: oropharynx clear Neck: supple, nontender Lungs:  Clear throughout to auscultation.   No wheezes, crackles, or rhonchi. No acute distress. Heart:  Regular rate and rhythm; no murmurs, clicks, rubs,  or gallops. Abdomen: soft, nontender, nondistended, +BS  Rectal:  Deferred Ext: no edema  GI:  Lab Results: Recent Labs    08/13/21 0117  WBC 9.9  HGB 6.2*  HCT 20.7*  PLT 285   BMET Recent Labs    08/13/21 0117  NA 136  K 3.8  CL 108  CO2 17*  GLUCOSE 170*  BUN 28*  CREATININE 1.09*  CALCIUM 8.1*   LFT Recent Labs    08/13/21 0117  PROT 5.0*  ALBUMIN 3.0*  AST 17  ALT 12  ALKPHOS 60  BILITOT 0.3   PT/INR Recent Labs    08/13/21 0117  LABPROT 14.9  INR  1.2     Studies/Results: No results found.  Impression/Plan: GI bleed likely colonic source. Suspect diverticular vs cecal AVMs. Vomiting X 3 was nonbloody and doubt related to rectal bleeding. Agree with transfusion. Sips of water and ice chips ok. Supportive care. If rectal bleeding worsens, then may need a CT angiogram. If bleeding persists with stable hemodynamics then may need an updated colonoscopy this weekend with possible APC but defer to Dr. Therisa Doyne who will f/u tomorrow.    LOS: 0 days   Lear Ng  08/13/2021, 10:35 AM  Questions please call [phone]

## 2021-08-13 NOTE — ED Triage Notes (Signed)
Pt brought to ED by [facility] with c/o painless rectal bleeding since approximately 5pm. Pt also endorses emesis.   EMS Interventions 20G IV RAC 500CC fluid bolus Zofran  '4mg'$  IVP  EMS Vitals BP 100/60 HR 90 CBG 201

## 2021-08-13 NOTE — Progress Notes (Signed)
Pre vitals for PRBCs taken; 30 min pre.

## 2021-08-13 NOTE — ED Notes (Signed)
Jenny Reichmann, brother, [phone] would like an update when one is available

## 2021-08-13 NOTE — ED Notes (Signed)
ED TO INPATIENT HANDOFF REPORT  ED Nurse Name and Phone #: Baxter Flattery, RN  S Name/Age/Gender [name] 76 y.o. female Room/Bed: 038C/038C  Code Status   Code Status: Full Code  Home/SNF/Other Home Patient oriented to: self, place, time, and situation Is this baseline? Yes   Triage Complete: Triage complete  Chief Complaint GI bleed [K92.2]  Triage Note Pt brought to ED by [facility] with c/o painless rectal bleeding since approximately 5pm. Pt also endorses emesis.   EMS Interventions 20G IV RAC 500CC fluid bolus Zofran  '4mg'$  IVP  EMS Vitals BP 100/60 HR 90 CBG 201   Allergies Allergies  Allergen Reactions   Codeine Itching and Other (See Comments)    Caused mouth to break out in hives   Neosporin [Neomycin-Bacitracin Zn-Polymyx] Itching and Swelling    Swelling at application site   Percocet [Oxycodone-Acetaminophen] Other (See Comments)    B-blockers, triggers depression   Tussionex Pennkinetic Er [Hydrocod Poli-Chlorphe Poli Er] Itching    Level of Care/Admitting Diagnosis ED Disposition     ED Disposition  Admit   Condition  --   [place]: [place] [[id number]]  Level of Care: Telemetry Medical [104]  May admit patient to [facility] or [facility] if equivalent level of care is available:: Yes  Covid Evaluation: Asymptomatic - no recent exposure (last 10 days) testing not required  Diagnosis: GI bleed [026378]  Admitting Physician: Kayleen Memos [[id number]]  Attending Physician: Kayleen Memos [[id number]]  Certification:: I certify this patient will need inpatient services for at least 2 midnights  Estimated Length of Stay: 2          B Medical/Surgery History Past Medical History:  Diagnosis Date   Asthma    Cancer ([place])    Depression    Hypertension    Peripheral vascular disease ([place])    Pneumonia    Sleep apnea    Stroke [facility])    Past Surgical History:  Procedure Laterality Date   DIRECT  LARYNGOSCOPY Left 06/29/2016   Procedure: WIDE EXCISION LEFT TONGUE CANCER DIRECT LARYNGOSCOPY;  Surgeon: [name], MD;  Location: [place];  Service: ENT;  Laterality: Left;   ESOPHAGOGASTRODUODENOSCOPY (EGD) WITH PROPOFOL Left 07/12/2018   Procedure: ESOPHAGOGASTRODUODENOSCOPY (EGD) WITH PROPOFOL;  Surgeon: Wilford Corner, MD;  Location: [place];  Service: Endoscopy;  Laterality: Left;   ESOPHAGOSCOPY N/A 06/29/2016   Procedure: ESOPHAGOSCOPY/BRONCHOSCOPY;  Surgeon: [name], MD;  Location: [place];  Service: ENT;  Laterality: N/A;   EYE SURGERY Bilateral 2016   HOT HEMOSTASIS N/A 07/12/2018   Procedure: HOT HEMOSTASIS (ARGON PLASMA COAGULATION/BICAP);  Surgeon: Wilford Corner, MD;  Location: [place];  Service: Endoscopy;  Laterality: N/A;   KNEE SURGERY Right 2014   LOOP RECORDER INSERTION N/A 03/21/2016   Procedure: Loop Recorder Insertion;  Surgeon: Will Meredith Leeds, MD;  Location: [place] [facility];  Service: Cardiovascular;  Laterality: N/A;   PARS PLANA VITRECTOMY Right 02/28/2014   Procedure: PARS PLANA VITRECTOMY 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 WITH TAP AND ANTIBIOTIC INJECTION;  Surgeon: Hurman Horn, MD;  Location: [place];  Service: Ophthalmology;  Laterality: Right;   TEE WITHOUT CARDIOVERSION N/A 03/21/2016   Procedure: TRANSESOPHAGEAL ECHOCARDIOGRAM (TEE);  Surgeon: Skeet Latch, MD;  Location: [facility] ENDOSCOPY;  Service: Cardiovascular;  Laterality: N/A;     A IV Location/Drains/Wounds Patient Lines/Drains/Airways Status     Active Line/Drains/Airways     Name Placement date Placement time Site Days   Peripheral IV  08/13/21 20 G Right Antecubital 08/13/21  0129  Antecubital  less than 1   Incision (Closed) 02/28/14 Eye Right 02/28/14  1558  -- 2723   Incision (Closed) 06/29/16 Lip 06/29/16  1044  -- 1871   Wound / Incision (Open or Dehisced) 03/18/16 Laceration Leg Right;Left 03/18/16  2123  Leg  1974            Intake/Output Last 24  hours  Intake/Output Summary (Last 24 hours) at 08/13/2021 1058 Last data filed at 08/13/2021 0263 Gross per 24 hour  Intake 315 ml  Output --  Net 315 ml    Labs/Imaging Results for orders placed or performed during the hospital encounter of 08/13/21 (from the past 48 hour(s))  Type and screen [place]     Status: None (Preliminary result)   Collection Time: 08/13/21  1:03 AM  Result Value Ref Range   ABO/RH(D) O POS    Antibody Screen NEG    Sample Expiration 08/16/2021,2359    Unit Number [id number]    Blood Component Type RED CELLS,LR    Unit division 00    Status of Unit ISSUED    Transfusion Status OK TO TRANSFUSE    Crossmatch Result Compatible    Unit Number [id number]    Blood Component Type RED CELLS,LR    Unit division 00    Status of Unit ALLOCATED    Transfusion Status OK TO TRANSFUSE    Crossmatch Result Compatible    Unit Number [id number]    Blood Component Type RED CELLS,LR    Unit division 00    Status of Unit ALLOCATED    Transfusion Status OK TO TRANSFUSE    Crossmatch Result Compatible    Unit Number [id number]    Blood Component Type RED CELLS,LR    Unit division 00    Status of Unit ISSUED    Transfusion Status OK TO TRANSFUSE    Crossmatch Result      Compatible Performed at [facility] [address], [place], [place] [address]   CBC with Differential     Status: Abnormal   Collection Time: 08/13/21  1:17 AM  Result Value Ref Range   WBC 9.9 4.0 - 10.5 K/uL   RBC 2.10 (L) 3.87 - 5.11 MIL/uL   Hemoglobin 6.2 (LL) 12.0 - 15.0 g/dL    Comment: REPEATED TO VERIFY THIS CRITICAL RESULT HAS VERIFIED AND BEEN CALLED TO Osvaldo Angst, RN BY HAYLEE HOWARD ON 07 28 2023 AT 0137, AND HAS BEEN READ BACK.     HCT 20.7 (L) 36.0 - 46.0 %   MCV 98.6 80.0 - 100.0 fL   MCH 29.5 26.0 - 34.0 pg   MCHC 30.0 30.0 - 36.0 g/dL   RDW 13.2 11.5 - 15.5 %   Platelets 285 150 - 400 K/uL    Comment: REPEATED TO VERIFY   nRBC 0.0  0.0 - 0.2 %   Neutrophils Relative % 70 %   Neutro Abs 7.1 1.7 - 7.7 K/uL   Lymphocytes Relative 19 %   Lymphs Abs 1.8 0.7 - 4.0 K/uL   Monocytes Relative 7 %   Monocytes Absolute 0.7 0.1 - 1.0 K/uL   Eosinophils Relative 2 %   Eosinophils Absolute 0.2 0.0 - 0.5 K/uL   Basophils Relative 1 %   Basophils Absolute 0.1 0.0 - 0.1 K/uL   Immature Granulocytes 1 %   Abs Immature Granulocytes 0.09 (H) 0.00 - 0.07 K/uL    Comment:  Performed at [facility], [place] [address], [place], [place] [address]  Comprehensive metabolic panel     Status: Abnormal   Collection Time: 08/13/21  1:17 AM  Result Value Ref Range   Sodium 136 135 - 145 mmol/L   Potassium 3.8 3.5 - 5.1 mmol/L   Chloride 108 98 - 111 mmol/L   CO2 17 (L) 22 - 32 mmol/L   Glucose, Bld 170 (H) 70 - 99 mg/dL    Comment: Glucose reference range applies only to samples taken after fasting for at least 8 hours.   BUN 28 (H) 8 - 23 mg/dL   Creatinine, Ser 1.09 (H) 0.44 - 1.00 mg/dL   Calcium 8.1 (L) 8.9 - 10.3 mg/dL   Total Protein 5.0 (L) 6.5 - 8.1 g/dL   Albumin 3.0 (L) 3.5 - 5.0 g/dL   AST 17 15 - 41 U/L   ALT 12 0 - 44 U/L   Alkaline Phosphatase 60 38 - 126 U/L   Total Bilirubin 0.3 0.3 - 1.2 mg/dL   GFR, Estimated 52 (L) >60 mL/min    Comment: (NOTE) Calculated using the CKD-EPI Creatinine Equation (2021)    Anion gap 11 5 - 15    Comment: Performed at [facility], [place] [address], [place], [place] [address]  Protime-INR     Status: None   Collection Time: 08/13/21  1:17 AM  Result Value Ref Range   Prothrombin Time 14.9 11.4 - 15.2 seconds   INR 1.2 0.8 - 1.2    Comment: (NOTE) INR goal varies based on device and disease states. Performed at [facility], [place] [address] [place], [place] [address]   Prepare RBC (crossmatch)     Status: None   Collection Time: 08/13/21  1:18 AM  Result Value Ref Range   Order Confirmation      ORDER PROCESSED BY BLOOD BANK Performed at [facility],  [place] [address], [place], [place] [address]    No results found.  Pending Labs Unresulted Labs (From admission, onward)     Start     Ordered   08/13/21 1130  Comprehensive metabolic panel  Once-Timed,   TIMED       Comments: Re-timed d/t blood transfusing    08/13/21 0729   08/13/21 1130  CBC with Differential/Platelet  Once-Timed,   TIMED       Comments: Re-timed d/t blood transfusing    08/13/21 0732   08/13/21 1130  Magnesium  Once-Timed,   TIMED       Comments: Re-timed d/t blood transfusing    08/13/21 0732   08/13/21 1130  Phosphorus  Once-Timed,   TIMED       Comments: Re-timed d/t blood transfusing    08/13/21 0732   08/13/21 0223  Hemoglobin and hematocrit, blood  Now then every 6 hours,   R (with TIMED occurrences)      08/13/21 0222            Vitals/Pain Today's Vitals   08/13/21 0634 08/13/21 0645 08/13/21 0700 08/13/21 0934  BP: (!) 99/53 (!) 109/53 (!) 120/52 112/76  Pulse: 76 80 76 85  Resp: '13 15 13 16  '$ Temp: (!) 97.5 F (36.4 C)   (!) 97.5 F (36.4 C)  TempSrc: Oral   Oral  SpO2: 97% 95% 97%   PainSc:        Isolation Precautions No active isolations  Medications Medications  0.9 %  sodium chloride infusion (  0 mL/hr Intravenous Hold 08/13/21 0732)  pantoprozole (PROTONIX) 80 mg /NS 100 mL infusion (8 mg/hr Intravenous New Bag/Given 08/13/21 0319)  acetaminophen (TYLENOL) tablet 650 mg (650 mg Oral Given 08/13/21 0324)  prochlorperazine (COMPAZINE) injection 10 mg (has no administration in time range)  lactated ringers infusion ( Intravenous New Bag/Given 08/13/21 0315)  mometasone-formoterol (DULERA) 100-5 MCG/ACT inhaler 2 puff (2 puffs Inhalation Given 08/13/21 0936)  atorvastatin (LIPITOR) tablet 10 mg (10 mg Oral Given 08/13/21 0932)  escitalopram (LEXAPRO) tablet 20 mg (20 mg Oral Given 08/13/21 0932)  cyanocobalamin (VITAMIN B12) tablet 1,000 mcg (1,000 mcg Oral Given 08/13/21 0932)  lactated ringers bolus 1,000 mL (0 mLs Intravenous Stopped  08/13/21 0302)  pantoprazole (PROTONIX) 80 mg /NS 100 mL IVPB (0 mg Intravenous Stopped 08/13/21 0313)  gabapentin (NEURONTIN) capsule 100 mg (100 mg Oral Given 08/13/21 0324)  hydrOXYzine (ATARAX) tablet 10 mg (10 mg Oral Given 08/13/21 0450)  gabapentin (NEURONTIN) capsule 100 mg (100 mg Oral Given 08/13/21 0449)  lactated ringers bolus 250 mL (250 mLs Intravenous New Bag/Given 08/13/21 1045)    Mobility walks with device Low fall risk   Focused Assessments Cardiac Assessment Handoff:  Cardiac Rhythm: Normal sinus rhythm No results found for: "CKTOTAL", "CKMB", "CKMBINDEX", "TROPONINI" No results found for: "DDIMER" Does the Patient currently have chest pain? No    R Recommendations: See Admitting Provider Note  Report given to:   Additional Notes:

## 2021-08-13 NOTE — ED Notes (Signed)
Pt offered to be cleaned up, pt declines at this time

## 2021-08-13 NOTE — H&P (Signed)
History and Physical  [name] WVP:[id number] DOB: September 15, 1945 DOA: 08/13/2021  Referring physician: Dr. Roxanne Mins, [name]  PCP: Shon Baton, MD  Outpatient Specialists: Cardiology, GI Patient coming from: Home  Chief Complaint: Rectal bleeding and vomiting x1  HPI: [name] is a 76 y.o. female with medical history significant for acute gastritis treated with argon plasma coagulation in 2020, prior CVA on Plavix, post loop recorder placement, asthma, chronic anxiety/depression, who presented to [facility] ED with complaints of sudden onset painless rectal bleeding associated with 1 episode of emesis on the day of presentation.  No abdominal pain.  No subjective fevers.  Last dose of home Plavix was taken on 08/12/21 AM.  Upon presentation to the ED, lab studies revealed significant anemia with hemoglobin of 6.2 from 7.6 at baseline.  2 units PRBCs ordered to be transfused by EDP.  Home Plavix held, EDP consulted GI [facility] Dr. Watt Climes.  TRH, hospitalist service, was asked to admit.  ED Course: Tmax 97.4.  BP 113/44, pulse 83, respiratory rate 20, O2 sats 100% on room air.  Lab studies remarkable for hemoglobin 6.2, MCV 98, platelet count 285, WBC 9.9.  Serum bicarb 17, anion glucose 170, BUN 28, creatinine 1.09.  Review of Systems: Review of systems as noted in the HPI. All other systems reviewed and are negative.   Past Medical History:  Diagnosis Date   Asthma    Cancer ([place])    Depression    Hypertension    Peripheral vascular disease ([place])    Pneumonia    Sleep apnea    Stroke [facility])    Past Surgical History:  Procedure Laterality Date   DIRECT LARYNGOSCOPY Left 06/29/2016   Procedure: WIDE EXCISION LEFT TONGUE CANCER DIRECT LARYNGOSCOPY;  Surgeon: [name], MD;  Location: [facility] OR;  Service: ENT;  Laterality: Left;   ESOPHAGOGASTRODUODENOSCOPY (EGD) WITH PROPOFOL Left 07/12/2018   Procedure: ESOPHAGOGASTRODUODENOSCOPY (EGD) WITH PROPOFOL;  Surgeon: Wilford Corner, MD;   Location: [place];  Service: Endoscopy;  Laterality: Left;   ESOPHAGOSCOPY N/A 06/29/2016   Procedure: ESOPHAGOSCOPY/BRONCHOSCOPY;  Surgeon: [name], MD;  Location: [place];  Service: ENT;  Laterality: N/A;   EYE SURGERY Bilateral 2016   HOT HEMOSTASIS N/A 07/12/2018   Procedure: HOT HEMOSTASIS (ARGON PLASMA COAGULATION/BICAP);  Surgeon: Wilford Corner, MD;  Location: [place];  Service: Endoscopy;  Laterality: N/A;   KNEE SURGERY Right 2014   LOOP RECORDER INSERTION N/A 03/21/2016   Procedure: Loop Recorder Insertion;  Surgeon: Will Meredith Leeds, MD;  Location: [place] CV LAB;  Service: Cardiovascular;  Laterality: N/A;   PARS PLANA VITRECTOMY Right 02/28/2014   Procedure: PARS PLANA VITRECTOMY 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 WITH TAP AND ANTIBIOTIC INJECTION;  Surgeon: Hurman Horn, MD;  Location: [place];  Service: Ophthalmology;  Laterality: Right;   TEE WITHOUT CARDIOVERSION N/A 03/21/2016   Procedure: TRANSESOPHAGEAL ECHOCARDIOGRAM (TEE);  Surgeon: Skeet Latch, MD;  Location: [facility] ENDOSCOPY;  Service: Cardiovascular;  Laterality: N/A;    Social History:  reports that she quit smoking about 5 years ago. She has never used smokeless tobacco. She reports that she does not drink alcohol and does not use drugs.   Allergies  Allergen Reactions   Codeine Itching and Other (See Comments)    "Broke out my mouth"   Neosporin [Neomycin-Bacitracin Zn-Polymyx] Itching and Swelling    Swelling at application site    Family History  Problem Relation Age of Onset   Cancer Mother        colon  Stroke Father    Heart disease Brother       Prior to Admission medications   Medication Sig Start Date End Date Taking? Authorizing Provider  acetaminophen (TYLENOL) 325 MG tablet Take 2 tablets (650 mg total) by mouth every 6 (six) hours as needed for mild pain or headache (or temp > 37.5 C (99.5 F)). 05/09/18   Barton Dubois, MD  albuterol (PROVENTIL) (2.5 MG/3ML) 0.083% nebulizer  solution Take 2.5 mg by nebulization every 6 (six) hours as needed for wheezing or shortness of breath.    [provider]  Albuterol Sulfate 108 (90 Base) MCG/ACT AEPB Inhale 2 puffs into the lungs every 6 (six) hours as needed for shortness of breath. 04/07/18   [provider]  amLODipine (NORVASC) 10 MG tablet Take 1 tablet (10 mg total) by mouth daily. 05/09/18   Barton Dubois, MD  atorvastatin (LIPITOR) 10 MG tablet Take 10 mg by mouth daily.  03/16/16   [provider]  azelastine (ASTELIN) 0.1 % nasal spray Place 1 spray into both nostrils 2 (two) times daily as needed for allergies. Use in each nostril as directed    [provider]  budesonide-formoterol (SYMBICORT) 80-4.5 MCG/ACT inhaler Inhale 2 puffs into the lungs 2 (two) times daily as needed (FOR RESPIRATORY ISSUES.).    [provider]  carboxymethylcellulose (REFRESH PLUS) 0.5 % SOLN Place 1 drop into both eyes 3 (three) times daily as needed (for dryness).     [provider]  Cholecalciferol (VITAMIN D-3) 25 MCG (1000 UT) CAPS Take 1,000 Units by mouth daily.    [provider]  clopidogrel (PLAVIX) 75 MG tablet Take 1 tablet (75 mg total) by mouth daily. 07/17/18   Caren Griffins, MD  escitalopram (LEXAPRO) 20 MG tablet Take 20 mg by mouth daily.  07/10/14   [provider]  ferrous sulfate 325 (65 FE) MG tablet Take 1 tablet (325 mg total) by mouth daily with breakfast. 07/13/18   Caren Griffins, MD  losartan (COZAAR) 25 MG tablet Take 1 tablet (25 mg total) by mouth daily. 05/10/18   Barton Dubois, MD  nicotine (NICODERM CQ - DOSED IN MG/24 HOURS) 21 mg/24hr patch Place 1 patch (21 mg total) onto the skin daily at 8 pm. Patient not taking: Reported on 07/10/2018 05/09/18   Barton Dubois, MD  pantoprazole (PROTONIX) 40 MG tablet Take 1 tablet (40 mg total) by mouth daily. 07/13/18   Caren Griffins, MD  vitamin B-12 (CYANOCOBALAMIN) 1000 MCG tablet Take  1,000 mcg by mouth daily.    [provider]    Physical Exam: BP 100/61   Pulse 82   Temp (!) 97.4 F (36.3 C) (Oral)   Resp (!) 24   SpO2 100%   General: 76 y.o. year-old female well developed well nourished in no acute distress.  Alert and oriented x3. Cardiovascular: Regular rate and rhythm with no rubs or gallops.  No thyromegaly or JVD noted.  No lower extremity edema. 2/4 pulses in all 4 extremities. Respiratory: Clear to auscultation with no wheezes or rales. Good inspiratory effort. Abdomen: Soft nontender nondistended with normal bowel sounds x4 quadrants. Muskuloskeletal: No cyanosis, clubbing or edema noted bilaterally Neuro: CN II-XII intact, strength, sensation, reflexes Skin: No ulcerative lesions noted or rashes Psychiatry: Judgement and insight appear normal. Mood is appropriate for condition and setting          Labs on Admission:  Basic Metabolic Panel: Recent Labs  Lab 08/13/21 0117  NA 136  K 3.8  CL 108  CO2 17*  GLUCOSE 170*  BUN 28*  CREATININE 1.09*  CALCIUM 8.1*   Liver Function Tests: Recent Labs  Lab 08/13/21 0117  AST 17  ALT 12  ALKPHOS 60  BILITOT 0.3  PROT 5.0*  ALBUMIN 3.0*   No results for input(s): "LIPASE", "AMYLASE" in the last 168 hours. No results for input(s): "AMMONIA" in the last 168 hours. CBC: Recent Labs  Lab 08/13/21 0117  WBC 9.9  NEUTROABS 7.1  HGB 6.2*  HCT 20.7*  MCV 98.6  PLT 285   Cardiac Enzymes: No results for input(s): "CKTOTAL", "CKMB", "CKMBINDEX", "TROPONINI" in the last 168 hours.  BNP (last 3 results) No results for input(s): "BNP" in the last 8760 hours.  ProBNP (last 3 results) No results for input(s): "PROBNP" in the last 8760 hours.  CBG: No results for input(s): "GLUCAP" in the last 168 hours.  Radiological Exams on Admission: No results found.  EKG: I independently viewed the EKG done and my findings are as followed: Normal sinus rhythm rate of 82.  Nonspecific ST-T  changes.  QTc 448  Assessment/Plan Present on Admission:  GI bleed  Principal Problem:   GI bleed  Painless rectal bleeding Acute blood loss anemia Iron deficiency anemia On Plavix prior to admission, hold. Presented with hemoglobin of 6.2 INR 1.2 2 units PRBCs ordered to be transfused Iron supplement on hold, restart if no procedure planned CBC posttransfusion Serial H&H every 6 hours Protonix drip NPO until seen by GI Gentle IV fluid hydration Maintain MAP>65  Non anion gap metabolic acidosis Serum bicarb 17, anion gap 11 Gentle IV fluid hydration Repeat chemistry panel in the morning  Essential hypertension, BPs are soft Hold off home oral antihypertensives in the setting of GI bleed to avoid hypotension. Monitor vital signs. Maintain MAP greater than 65.  Hyperlipidemia The patient is on Lipitor prior to admission.  Chronic anxiety/depression Resume home regimen  GERD On protonix drip Resume home PPI when appropriate  Legs cramps Robaxin could be poorly tolerated in the elderly Gabapentin 100 mg x 1 ordered   DVT prophylaxis: SCDs.  Code Status: Full code  Family Communication: None at bedside admitted to telemetry medical unit  Disposition Plan: Admitted to telemetry medical unit  Consults called: EDP consulted GI  Admission status: Inpatient.   Status is: Inpatient The patient requires at least 2 midnights for further evaluation and treatment of present condition.   Kayleen Memos MD [facility] Pager [phone]  If 7PM-7AM, please contact night-coverage [email or web address] Password [facility]  08/13/2021, 2:23 AM

## 2021-08-13 NOTE — ED Provider Notes (Signed)
[facility] EMERGENCY DEPARTMENT Provider Note   CSN: [id number] Arrival date & time: 08/13/21  0051     History  Chief Complaint  Patient presents with   Rectal Bleeding    [name] is a 76 y.o. female.  The history is provided by the patient.  Rectal Bleeding She has history of hypertension, stroke, peripheral vascular disease and comes in with rectal bleeding.  She states that she passed blood rectally on 2 occasions this evening.  She did vomit once but does not know if there is blood in it.  She states that she has very poor vision and does not know what color of the rectal bleeding was.  She denies abdominal pain and she denies dizziness or lightheadedness.  She is not on any anticoagulants, but she does take clopidogrel.   Home Medications Prior to Admission medications   Medication Sig Start Date End Date Taking? Authorizing Provider  acetaminophen (TYLENOL) 325 MG tablet Take 2 tablets (650 mg total) by mouth every 6 (six) hours as needed for mild pain or headache (or temp > 37.5 C (99.5 F)). 05/09/18   Barton Dubois, MD  albuterol (PROVENTIL) (2.5 MG/3ML) 0.083% nebulizer solution Take 2.5 mg by nebulization every 6 (six) hours as needed for wheezing or shortness of breath.    [provider]  Albuterol Sulfate 108 (90 Base) MCG/ACT AEPB Inhale 2 puffs into the lungs every 6 (six) hours as needed for shortness of breath. 04/07/18   [provider]  amLODipine (NORVASC) 10 MG tablet Take 1 tablet (10 mg total) by mouth daily. 05/09/18   Barton Dubois, MD  atorvastatin (LIPITOR) 10 MG tablet Take 10 mg by mouth daily.  03/16/16   [provider]  azelastine (ASTELIN) 0.1 % nasal spray Place 1 spray into both nostrils 2 (two) times daily as needed for allergies. Use in each nostril as directed    [provider]  budesonide-formoterol (SYMBICORT) 80-4.5 MCG/ACT inhaler Inhale 2 puffs into the lungs 2 (two) times daily  as needed (FOR RESPIRATORY ISSUES.).    [provider]  carboxymethylcellulose (REFRESH PLUS) 0.5 % SOLN Place 1 drop into both eyes 3 (three) times daily as needed (for dryness).     [provider]  Cholecalciferol (VITAMIN D-3) 25 MCG (1000 UT) CAPS Take 1,000 Units by mouth daily.    [provider]  clopidogrel (PLAVIX) 75 MG tablet Take 1 tablet (75 mg total) by mouth daily. 07/17/18   Caren Griffins, MD  escitalopram (LEXAPRO) 20 MG tablet Take 20 mg by mouth daily.  07/10/14   [provider]  ferrous sulfate 325 (65 FE) MG tablet Take 1 tablet (325 mg total) by mouth daily with breakfast. 07/13/18   Caren Griffins, MD  losartan (COZAAR) 25 MG tablet Take 1 tablet (25 mg total) by mouth daily. 05/10/18   Barton Dubois, MD  nicotine (NICODERM CQ - DOSED IN MG/24 HOURS) 21 mg/24hr patch Place 1 patch (21 mg total) onto the skin daily at 8 pm. Patient not taking: Reported on 07/10/2018 05/09/18   Barton Dubois, MD  pantoprazole (PROTONIX) 40 MG tablet Take 1 tablet (40 mg total) by mouth daily. 07/13/18   Caren Griffins, MD  vitamin B-12 (CYANOCOBALAMIN) 1000 MCG tablet Take 1,000 mcg by mouth daily.    [provider]      Allergies    Codeine and Neosporin [neomycin-bacitracin zn-polymyx]    Review of Systems   Review  of Systems  Gastrointestinal:  Positive for hematochezia.  All other systems reviewed and are negative.   Physical Exam Updated Vital Signs BP (!) 108/57   Pulse 87   Temp (!) 97.4 F (36.3 C) (Oral)   Resp 20   SpO2 100%  Physical Exam Vitals and nursing note reviewed. Exam conducted with a chaperone present.   76 year old female, resting comfortably and in no acute distress. Vital signs are significant for borderline low blood pressure. Oxygen saturation is 100%, which is normal. Head is normocephalic and atraumatic. PERRLA, EOMI. Oropharynx is clear. Neck is nontender and supple without adenopathy or  JVD. Back is nontender and there is no CVA tenderness. Lungs are clear without rales, wheezes, or rhonchi. Chest is nontender. Heart has regular rate and rhythm without murmur. Abdomen is soft, flat, nontender. Rectal: Dark red blood present with few clots. Extremities have no cyanosis or edema, full range of motion is present. Skin is warm and dry without rash. Neurologic: Mental status is normal, cranial nerves are intact, moves all extremities equally.  ED Results / Procedures / Treatments   Labs (all labs ordered are listed, but only abnormal results are displayed) Labs Reviewed  CBC WITH DIFFERENTIAL/PLATELET - Abnormal; Notable for the following components:      Result Value   RBC 2.10 (*)    Hemoglobin 6.2 (*)    HCT 20.7 (*)    Abs Immature Granulocytes 0.09 (*)    All other components within normal limits  COMPREHENSIVE METABOLIC PANEL - Abnormal; Notable for the following components:   CO2 17 (*)    Glucose, Bld 170 (*)    BUN 28 (*)    Creatinine, Ser 1.09 (*)    Calcium 8.1 (*)    Total Protein 5.0 (*)    Albumin 3.0 (*)    GFR, Estimated 52 (*)    All other components within normal limits  PROTIME-INR  TYPE AND SCREEN  PREPARE RBC (CROSSMATCH)    EKG EKG Interpretation  Date/Time:  Friday August 13 2021 01:05:09 EDT Ventricular Rate:  82 PR Interval:  153 QRS Duration: 89 QT Interval:  383 QTC Calculation: 448 R Axis:   54 Text Interpretation: Sinus rhythm Low voltage, precordial leads Minimal ST depression, diffuse leads When compared with ECG of 05/04/2018, No significant change was found Confirmed by Delora Fuel ([id number]) on 08/13/2021 1:07:10 AM  Procedures Procedures  Cardiac monitor shows normal sinus rhythm, per my interpretation.  Medications Ordered in ED Medications  lactated ringers bolus 1,000 mL (has no administration in time range)  0.9 %  sodium chloride infusion (has no administration in time range)    ED Course/ Medical Decision  Making/ A&P                           Medical Decision Making Amount and/or Complexity of Data Reviewed Labs: ordered.  Risk Prescription drug management.   Rectal bleeding.  Color is suggestive of colonic source.  Old records are reviewed, and she was hospitalized on 07/10/2018 for an upper GI bleed.  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  She also had a CT of abdomen and pelvis on 08/06/2021 with no mention of diverticulosis.  I reviewed those images, and saw no obvious diverticulosis.  Of note, CT report states that the reason that CT was ordered was bloody/watery stools for 2 weeks prior to that.  I have ordered  screening labs of CBC, comprehensive metabolic panel, INR and I have ordered IV fluids.  Since bleeding seems to be from colon, I have not ordered pantoprazole.  I have ordered 2 units of blood to be typed and crossed but will hold off on transfusion until I am able to assess her hemoglobin level.  I have reviewed and interpreted the CBC.  Hemoglobin is very low at 6.2.  Last hemoglobin on record was 07/13/2018 and was 7.7 following transfusion for upper GI bleed.  Prior to that, hemoglobins ranged from 13-14.  I have ordered transfusion of 2 units of packed red blood cells.  I have reviewed and interpreted a metabolic panel and coagulation studies.  My interpretation is normal INR, metabolic acidosis likely secondary to hypovolemia, mild renal insufficiency not significantly changed from baseline but BUN elevated raising possibility of upper GI bleed.  Therefore, pantoprazole bolus and drip were initiated.  Additional findings on metabolic panel is hypoalbuminemia which is likely nutritional.  Consultation has been placed with gastroenterology, secure just chat sent to Dr. Watt Climes.  Case is discussed with Dr. [place] Crane of [facility], who agrees to admit the patient.  CRITICAL CARE Performed by: Delora Fuel Total critical care  time: 50 minutes Critical care time was exclusive of separately billable procedures and treating other patients. Critical care was necessary to treat or prevent imminent or life-threatening deterioration. Critical care was time spent personally by me on the following activities: development of treatment plan with patient and/or surrogate as well as nursing, discussions with consultants, evaluation of patient's response to treatment, examination of patient, obtaining history from patient or surrogate, ordering and performing treatments and interventions, ordering and review of laboratory studies, ordering and review of radiographic studies, pulse oximetry and re-evaluation of patient's condition.  Final Clinical Impression(s) / ED Diagnoses Final diagnoses:  Acute GI bleeding  Metabolic acidosis    Rx / DC Orders ED Discharge Orders     None         Delora Fuel, MD 42/35/36 [phone]

## 2021-08-13 NOTE — Progress Notes (Signed)
  Progress Note   Patient: [name] OVF:[id number] DOB: 1945/03/10 DOA: 08/13/2021     0 DOS: the patient was seen and examined on 08/13/2021   Brief hospital course: 76 y.o. female with medical history significant for acute gastritis treated with argon plasma coagulation in 2020, prior CVA on Plavix, post loop recorder placement, asthma, chronic anxiety/depression, who presented to [facility] ED with complaints of sudden onset painless rectal bleeding associated with 1 episode of emesis on the day of presentation.  No abdominal pain.  No subjective fevers.  Last dose of home Plavix was taken on 08/12/21 AM.   Upon presentation to the ED, lab studies revealed significant anemia with hemoglobin of 6.2 from 7.6 at baseline.  2 units PRBCs ordered to be transfused by EDP.  Home Plavix held, EDP consulted GI [facility] Dr. Watt Climes.  TRH, hospitalist service, was asked to admit  Assessment and Plan: Painless rectal bleeding Acute blood loss anemia Iron deficiency anemia On Plavix prior to admission, hold. Presented with hemoglobin of 6.2 INR 1.2 Given 2 units PRBCs  On protonix drip Seen by GI, recs to cont to transfuse as needed, may need CT angiogram if bleeding worsens and may need updated colonoscopy Repeat cbc in AM   Non anion gap metabolic acidosis Serum bicarb 17, anion gap 11 Resolved with IV fluid hydration   Essential hypertension, BPs are soft Hold off home oral antihypertensives in the setting of GI bleed to avoid hypotension. Monitor vital signs.   Hyperlipidemia The patient is on Lipitor prior to admission.   Chronic anxiety/depression Resume home regimen   GERD On protonix drip Resume home PPI when appropriate   Legs cramps Robaxin could be poorly tolerated in the elderly Gabapentin 100 mg x 1 ordered at time of admit     Subjective: Without complaints this AM  Physical Exam: Vitals:   08/13/21 0934 08/13/21 1100 08/13/21 1201 08/13/21 1234  BP: 112/76 (!)  104/50 (!) 92/54 (!) 104/57  Pulse: 85 77 71 72  Resp: '16 20 17 12  '$ Temp: (!) 97.5 F (36.4 C)  98 F (36.7 C)   TempSrc: Oral     SpO2:  96% 97% 97%   General exam: Awake, laying in bed, in nad Respiratory system: Normal respiratory effort, no wheezing Cardiovascular system: regular rate, s1, s2 Gastrointestinal system: Soft, nondistended, positive BS Central nervous system: CN2-12 grossly intact, strength intact Extremities: Perfused, no clubbing Skin: Normal skin turgor, no notable skin lesions seen Psychiatry: Mood normal // no visual hallucinations   Data Reviewed:  Labs reviewed: Na 139, Cr 1.13, Hgb 7.1 (from 6.2)  Family Communication: Pt in room, family not at bedside  Disposition: Status is: Inpatient Remains inpatient appropriate because: Severity of illness  Planned Discharge Destination: Home   Author: Marylu Lund, MD 08/13/2021 3:12 PM  For on call review [email or web address].

## 2021-08-13 NOTE — ED Notes (Signed)
Pt receiving blood '@310'$  am(lab)

## 2021-08-13 NOTE — Progress Notes (Addendum)
Hgb down to 5.6 this evening. No active bleeding per RN. Plan to transfuse 2 more units RBC.

## 2021-08-13 NOTE — Progress Notes (Signed)
Hgb of 5.6 just called by lab; critical value. Paged to MD.

## 2021-08-14 DIAGNOSIS — E872 Acidosis, unspecified: Secondary | ICD-10-CM | POA: Diagnosis not present

## 2021-08-14 DIAGNOSIS — K922 Gastrointestinal hemorrhage, unspecified: Secondary | ICD-10-CM | POA: Diagnosis not present

## 2021-08-14 LAB — CBC
HCT: 27.9 % — ABNORMAL LOW (ref 36.0–46.0)
Hemoglobin: 9.4 g/dL — ABNORMAL LOW (ref 12.0–15.0)
MCH: 29.1 pg (ref 26.0–34.0)
MCHC: 33.7 g/dL (ref 30.0–36.0)
MCV: 86.4 fL (ref 80.0–100.0)
Platelets: 142 10*3/uL — ABNORMAL LOW (ref 150–400)
RBC: 3.23 MIL/uL — ABNORMAL LOW (ref 3.87–5.11)
RDW: 16.1 % — ABNORMAL HIGH (ref 11.5–15.5)
WBC: 8.2 10*3/uL (ref 4.0–10.5)
nRBC: 0 % (ref 0.0–0.2)

## 2021-08-14 LAB — COMPREHENSIVE METABOLIC PANEL
ALT: 11 U/L (ref 0–44)
AST: 22 U/L (ref 15–41)
Albumin: 2.4 g/dL — ABNORMAL LOW (ref 3.5–5.0)
Alkaline Phosphatase: 48 U/L (ref 38–126)
Anion gap: 7 (ref 5–15)
BUN: 31 mg/dL — ABNORMAL HIGH (ref 8–23)
CO2: 18 mmol/L — ABNORMAL LOW (ref 22–32)
Calcium: 7.9 mg/dL — ABNORMAL LOW (ref 8.9–10.3)
Chloride: 112 mmol/L — ABNORMAL HIGH (ref 98–111)
Creatinine, Ser: 1.34 mg/dL — ABNORMAL HIGH (ref 0.44–1.00)
GFR, Estimated: 41 mL/min — ABNORMAL LOW (ref >60)
Glucose, Bld: 118 mg/dL — ABNORMAL HIGH (ref 70–99)
Potassium: 3.9 mmol/L (ref 3.5–5.1)
Sodium: 137 mmol/L (ref 135–145)
Total Bilirubin: 1 mg/dL (ref 0.3–1.2)
Total Protein: 4.2 g/dL — ABNORMAL LOW (ref 6.5–8.1)

## 2021-08-14 LAB — TYPE AND SCREEN: Unit division: 0

## 2021-08-14 LAB — HEMOGLOBIN AND HEMATOCRIT, BLOOD
HCT: 27.5 % — ABNORMAL LOW (ref 36.0–46.0)
HCT: 27.7 % — ABNORMAL LOW (ref 36.0–46.0)
Hemoglobin: 9.1 g/dL — ABNORMAL LOW (ref 12.0–15.0)
Hemoglobin: 9.2 g/dL — ABNORMAL LOW (ref 12.0–15.0)

## 2021-08-14 MED ORDER — PEG 3350-KCL-NA BICARB-NACL 420 G PO SOLR
4000.0000 mL | Freq: Once | ORAL | Status: AC
  Administered 2021-08-14: 4000 mL via ORAL
  Filled 2021-08-14: qty 4000

## 2021-08-14 NOTE — Progress Notes (Signed)
2nd unit of PBRC completed at 0544.  Hgb will be drawn at 0800.

## 2021-08-14 NOTE — Progress Notes (Signed)
Subjective: Patient reports rectal bleeding. As per nursing staff she had bright red blood and dropped her hemoglobin from 7.1-5.6 today.  Objective: Vital signs in last 24 hours: Temp:  [97.6 F (36.4 C)-98.9 F (37.2 C)] 98.1 F (36.7 C) (07/29 0544) Pulse Rate:  [71-79] 79 (07/29 0822) Resp:  [11-20] 16 (07/29 0822) BP: (92-121)/(42-60) 121/46 (07/29 0544) SpO2:  [93 %-99 %] 94 % (07/29 0822) Weight change:  Last BM Date : 08/13/21  PE: Elderly, thinly built GENERAL: Obvious pallor  ABDOMEN: Soft, nondistended, nontender abdomen EXTREMITIES: No deformity  Lab Results: Results for orders placed or performed during the hospital encounter of 08/13/21 (from the past 48 hour(s))  Type and screen [place]     Status: None (Preliminary result)   Collection Time: 08/13/21  1:03 AM  Result Value Ref Range   ABO/RH(D) O POS    Antibody Screen NEG    Sample Expiration 08/16/2021,2359    Unit Number [id number]    Blood Component Type RED CELLS,LR    Unit division 00    Status of Unit ISSUED,FINAL    Transfusion Status OK TO TRANSFUSE    Crossmatch Result Compatible    Unit Number [id number]    Blood Component Type RED CELLS,LR    Unit division 00    Status of Unit ISSUED    Transfusion Status OK TO TRANSFUSE    Crossmatch Result Compatible    Unit Number [id number]    Blood Component Type RED CELLS,LR    Unit division 00    Status of Unit ISSUED,FINAL    Transfusion Status OK TO TRANSFUSE    Crossmatch Result Compatible    Unit Number [id number]    Blood Component Type RED CELLS,LR    Unit division 00    Status of Unit ISSUED,FINAL    Transfusion Status OK TO TRANSFUSE    Crossmatch Result      Compatible Performed at [facility], [address], [place], [place] [address]   CBC with Differential     Status: Abnormal   Collection Time: 08/13/21  1:17 AM  Result Value Ref Range   WBC 9.9 4.0 - 10.5 K/uL   RBC 2.10 (L) 3.87 -  5.11 MIL/uL   Hemoglobin 6.2 (LL) 12.0 - 15.0 g/dL    Comment: REPEATED TO VERIFY THIS CRITICAL RESULT HAS VERIFIED AND BEEN CALLED TO Osvaldo Angst, RN BY HAYLEE HOWARD ON 07 28 2023 AT 0137, AND HAS BEEN READ BACK.     HCT 20.7 (L) 36.0 - 46.0 %   MCV 98.6 80.0 - 100.0 fL   MCH 29.5 26.0 - 34.0 pg   MCHC 30.0 30.0 - 36.0 g/dL   RDW 13.2 11.5 - 15.5 %   Platelets 285 150 - 400 K/uL    Comment: REPEATED TO VERIFY   nRBC 0.0 0.0 - 0.2 %   Neutrophils Relative % 70 %   Neutro Abs 7.1 1.7 - 7.7 K/uL   Lymphocytes Relative 19 %   Lymphs Abs 1.8 0.7 - 4.0 K/uL   Monocytes Relative 7 %   Monocytes Absolute 0.7 0.1 - 1.0 K/uL   Eosinophils Relative 2 %   Eosinophils Absolute 0.2 0.0 - 0.5 K/uL   Basophils Relative 1 %   Basophils Absolute 0.1 0.0 - 0.1 K/uL   Immature Granulocytes 1 %   Abs Immature Granulocytes 0.09 (H) 0.00 - 0.07 K/uL    Comment: Performed at [facility], [address] [place]  [address] [place], [place] [address]  Comprehensive metabolic panel     Status: Abnormal   Collection Time: 08/13/21  1:17 AM  Result Value Ref Range   Sodium 136 135 - 145 mmol/L   Potassium 3.8 3.5 - 5.1 mmol/L   Chloride 108 98 - 111 mmol/L   CO2 17 (L) 22 - 32 mmol/L   Glucose, Bld 170 (H) 70 - 99 mg/dL    Comment: Glucose reference range applies only to samples taken after fasting for at least 8 hours.   BUN 28 (H) 8 - 23 mg/dL   Creatinine, Ser 1.09 (H) 0.44 - 1.00 mg/dL   Calcium 8.1 (L) 8.9 - 10.3 mg/dL   Total Protein 5.0 (L) 6.5 - 8.1 g/dL   Albumin 3.0 (L) 3.5 - 5.0 g/dL   AST 17 15 - 41 U/L   ALT 12 0 - 44 U/L   Alkaline Phosphatase 60 38 - 126 U/L   Total Bilirubin 0.3 0.3 - 1.2 mg/dL   GFR, Estimated 52 (L) >60 mL/min    Comment: (NOTE) Calculated using the CKD-EPI Creatinine Equation (2021)    Anion gap 11 5 - 15    Comment: Performed at [facility], [place] [address], [place], [place] [address]  Protime-INR     Status: None   Collection Time: 08/13/21  1:17 AM  Result Value  Ref Range   Prothrombin Time 14.9 11.4 - 15.2 seconds   INR 1.2 0.8 - 1.2    Comment: (NOTE) INR goal varies based on device and disease states. Performed at [facility], [place] [address] [place], [place] [address]   Prepare RBC (crossmatch)     Status: None   Collection Time: 08/13/21  1:18 AM  Result Value Ref Range   Order Confirmation      ORDER PROCESSED BY BLOOD BANK Performed at [facility], [place] [address], [place], [place] [address]   Comprehensive metabolic panel     Status: Abnormal   Collection Time: 08/13/21 11:27 AM  Result Value Ref Range   Sodium 139 135 - 145 mmol/L   Potassium 4.6 3.5 - 5.1 mmol/L    Comment: DELTA CHECK NOTED   Chloride 112 (H) 98 - 111 mmol/L   CO2 23 22 - 32 mmol/L   Glucose, Bld 125 (H) 70 - 99 mg/dL    Comment: Glucose reference range applies only to samples taken after fasting for at least 8 hours.   BUN 24 (H) 8 - 23 mg/dL   Creatinine, Ser 1.13 (H) 0.44 - 1.00 mg/dL   Calcium 7.8 (L) 8.9 - 10.3 mg/dL   Total Protein 3.8 (L) 6.5 - 8.1 g/dL   Albumin 2.4 (L) 3.5 - 5.0 g/dL   AST 14 (L) 15 - 41 U/L   ALT 11 0 - 44 U/L   Alkaline Phosphatase 48 38 - 126 U/L   Total Bilirubin 0.4 0.3 - 1.2 mg/dL   GFR, Estimated 50 (L) >60 mL/min    Comment: (NOTE) Calculated using the CKD-EPI Creatinine Equation (2021)    Anion gap 4 (L) 5 - 15    Comment: Performed at [facility], [place] [address] [place], [place] [address]  CBC with Differential/Platelet     Status: Abnormal   Collection Time: 08/13/21 11:27 AM  Result Value Ref Range   WBC 8.9 4.0 - 10.5 K/uL   RBC 2.38 (L) 3.87 - 5.11 MIL/uL   Hemoglobin 7.1 (L) 12.0 - 15.0  g/dL   HCT 21.9 (L) 36.0 - 46.0 %   MCV 92.0 80.0 - 100.0 fL   MCH 29.8 26.0 - 34.0 pg   MCHC 32.4 30.0 - 36.0 g/dL   RDW 14.2 11.5 - 15.5 %   Platelets 172 150 - 400 K/uL   nRBC 0.0 0.0 - 0.2 %   Neutrophils Relative % 73 %   Neutro Abs 6.5 1.7 - 7.7 K/uL   Lymphocytes Relative 19 %   Lymphs Abs  1.7 0.7 - 4.0 K/uL   Monocytes Relative 6 %   Monocytes Absolute 0.6 0.1 - 1.0 K/uL   Eosinophils Relative 0 %   Eosinophils Absolute 0.0 0.0 - 0.5 K/uL   Basophils Relative 1 %   Basophils Absolute 0.0 0.0 - 0.1 K/uL   Immature Granulocytes 1 %   Abs Immature Granulocytes 0.06 0.00 - 0.07 K/uL    Comment: Performed at [facility] [address] [place], [place] [address]  Magnesium     Status: Abnormal   Collection Time: 08/13/21 11:27 AM  Result Value Ref Range   Magnesium 1.6 (L) 1.7 - 2.4 mg/dL    Comment: Performed at [facility] [address], [place], [place] [address]  Phosphorus     Status: None   Collection Time: 08/13/21 11:27 AM  Result Value Ref Range   Phosphorus 4.1 2.5 - 4.6 mg/dL    Comment: Performed at [facility] [address] [place], [place] [address]  Hemoglobin and hematocrit, blood     Status: Abnormal   Collection Time: 08/13/21  7:54 PM  Result Value Ref Range   Hemoglobin 5.6 (LL) 12.0 - 15.0 g/dL    Comment: REPEATED TO VERIFY THIS CRITICAL RESULT HAS VERIFIED AND BEEN CALLED TO C. IDOL, RN BY DANIELLE LONG ON 07 28 2023 AT 2018, AND HAS BEEN READ BACK.     HCT 17.5 (L) 36.0 - 46.0 %    Comment: Performed at [facility], [place] [address] [place], [place] [address]  Prepare RBC (crossmatch)     Status: None   Collection Time: 08/13/21  8:27 PM  Result Value Ref Range   Order Confirmation      ORDER PROCESSED BY BLOOD BANK Performed at [facility], [place] [address] [place], [place] [address]     Studies/Results: No results found.  Medications: I have reviewed the patient's current medications.  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 Recommend EGD and colonoscopy. Patient reports difficulty with colonic prep. Plan to keep on clear liquid, transfused 2 unit PRBC, start colonic prep which is to be continued today and tomorrow and then an EGD and a colonoscopy with Dr. Alessandra Bevels on Monday.  Ronnette Juniper, MD 08/14/2021, 9:41 AM

## 2021-08-14 NOTE — Progress Notes (Signed)
  Progress Note   Patient: [name] GYJ:[id number] DOB: [birth year] DOA: 08/13/2021     1 DOS: the patient was seen and examined on 08/14/2021   Brief hospital course: 76 y.o. female with medical history significant for acute gastritis treated with argon plasma coagulation in 2020, prior CVA on Plavix, post loop recorder placement, asthma, chronic anxiety/depression, who presented to [facility] ED with complaints of sudden onset painless rectal bleeding associated with 1 episode of emesis on the day of presentation.  No abdominal pain.  No subjective fevers.  Last dose of home Plavix was taken on 08/12/21 AM.   Upon presentation to the ED, lab studies revealed significant anemia with hemoglobin of 6.2 from 7.6 at baseline.  2 units PRBCs ordered to be transfused by EDP.  Home Plavix held, EDP consulted [facility] Dr. Watt Climes.  TRH, hospitalist service, was asked to admit  Assessment and Plan: Painless rectal bleeding Acute blood loss anemia Iron deficiency anemia On Plavix prior to admission, hold. Presented with hemoglobin of 6.2, given 2 units PRBC's On protonix drip Hgb down to 5.6 this AM, now s/p 2 units PRBC's with post-transfusion hgb of 9.4 GI following, plan for bowel prep this weekend with EGD/colon on 7/31 Repeat cbc in AM   Non anion gap metabolic acidosis Serum bicarb 17, anion gap 11 Improved with IVF hydration   Essential hypertension, BPs are soft Hold off home oral antihypertensives in the setting of GI bleed to avoid hypotension. Monitor vital signs.   Hyperlipidemia The patient is on Lipitor prior to admission.   Chronic anxiety/depression Resume home regimen   GERD On protonix drip Resume home PPI when appropriate   Legs cramps Robaxin could be poorly tolerated in the elderly Gabapentin 100 mg x 1 ordered at time of admit     Subjective: Without complaints this AM  Physical Exam: Vitals:   08/14/21 1200 08/14/21 1300 08/14/21 1400 08/14/21 1500  BP:  (!) 138/55 (!) 140/59 (!) 136/56 (!) 128/46  Pulse: 72 65 74 72  Resp: '17 14 18 '$ (!) 22  Temp:      TempSrc:      SpO2: 97% 96% 97% 97%   General exam: Conversant, in no acute distress, pallor  Respiratory system: normal chest rise, clear, no audible wheezing Cardiovascular system: regular rhythm, s1-s2 Gastrointestinal system: Nondistended, nontender, pos BS Central nervous system: No seizures, no tremors Extremities: No cyanosis, no joint deformities Skin: No rashes, no pallor Psychiatry: Affect normal // no auditory hallucinations    Data Reviewed:  Labs reviewed: Na 137, Cr 1.34, Hgb 9.4 post-transfusion)  Family Communication: Pt in room, family not at bedside  Disposition: Status is: Inpatient Remains inpatient appropriate because: Severity of illness  Planned Discharge Destination: Home   Author: Marylu Lund, MD 08/14/2021 4:45 PM  For on call review [email or web address].

## 2021-08-14 NOTE — Progress Notes (Signed)
2nd unit PRBC started at [facility]

## 2021-08-15 DIAGNOSIS — E872 Acidosis, unspecified: Secondary | ICD-10-CM | POA: Diagnosis not present

## 2021-08-15 DIAGNOSIS — K922 Gastrointestinal hemorrhage, unspecified: Secondary | ICD-10-CM | POA: Diagnosis not present

## 2021-08-15 LAB — TYPE AND SCREEN
ABO/RH(D): O POS
Antibody Screen: NEGATIVE
Unit division: 0
Unit division: 0

## 2021-08-15 LAB — COMPREHENSIVE METABOLIC PANEL
ALT: 12 U/L (ref 0–44)
AST: 18 U/L (ref 15–41)
Albumin: 2.4 g/dL — ABNORMAL LOW (ref 3.5–5.0)
Alkaline Phosphatase: 51 U/L (ref 38–126)
Anion gap: 8 (ref 5–15)
BUN: 17 mg/dL (ref 8–23)
CO2: 21 mmol/L — ABNORMAL LOW (ref 22–32)
Calcium: 7.7 mg/dL — ABNORMAL LOW (ref 8.9–10.3)
Chloride: 109 mmol/L (ref 98–111)
Creatinine, Ser: 1.07 mg/dL — ABNORMAL HIGH (ref 0.44–1.00)
GFR, Estimated: 53 mL/min — ABNORMAL LOW (ref >60)
Glucose, Bld: 88 mg/dL (ref 70–99)
Potassium: 3.2 mmol/L — ABNORMAL LOW (ref 3.5–5.1)
Sodium: 138 mmol/L (ref 135–145)
Total Bilirubin: 0.4 mg/dL (ref 0.3–1.2)
Total Protein: 4.1 g/dL — ABNORMAL LOW (ref 6.5–8.1)

## 2021-08-15 LAB — BPAM RBC
Blood Product Expiration Date: 202308232359
Blood Product Expiration Date: 202308292359
Blood Product Expiration Date: 202308292359
Blood Product Expiration Date: 202308292359
ISSUE DATE / TIME: 202307280226
ISSUE DATE / TIME: 202307280609
ISSUE DATE / TIME: 202307282336
ISSUE DATE / TIME: 202307290211
Unit Type and Rh: 5100
Unit Type and Rh: 5100
Unit Type and Rh: 5100
Unit Type and Rh: 5100

## 2021-08-15 LAB — HEMOGLOBIN AND HEMATOCRIT, BLOOD
HCT: 26.5 % — ABNORMAL LOW (ref 36.0–46.0)
HCT: 30.5 % — ABNORMAL LOW (ref 36.0–46.0)
HCT: 30.9 % — ABNORMAL LOW (ref 36.0–46.0)
Hemoglobin: 9 g/dL — ABNORMAL LOW (ref 12.0–15.0)
Hemoglobin: 10 g/dL — ABNORMAL LOW (ref 12.0–15.0)
Hemoglobin: 10 g/dL — ABNORMAL LOW (ref 12.0–15.0)

## 2021-08-15 MED ORDER — POLYVINYL ALCOHOL 1.4 % OP SOLN
1.0000 [drp] | OPHTHALMIC | Status: DC | PRN
  Administered 2021-08-15: 1 [drp] via OPHTHALMIC
  Filled 2021-08-15: qty 15

## 2021-08-15 MED ORDER — POTASSIUM CHLORIDE CRYS ER 20 MEQ PO TBCR
40.0000 meq | EXTENDED_RELEASE_TABLET | ORAL | Status: AC
  Administered 2021-08-15 (×2): 40 meq via ORAL
  Filled 2021-08-15 (×2): qty 2

## 2021-08-15 NOTE — Progress Notes (Signed)
Subjective: She has finished about half of the colonic prep, reports noticing liquid bloody stool.  Objective: Vital signs in last 24 hours: Temp:  [97.8 F (36.6 C)-98.2 F (36.8 C)] 98.2 F (36.8 C) (07/30 0200) Pulse Rate:  [65-79] 79 (07/30 0700) Resp:  [12-22] 19 (07/30 0700) BP: (98-140)/(46-102) 139/67 (07/30 0700) SpO2:  [95 %-97 %] 96 % (07/30 0905) Weight change:  Last BM Date : 08/15/21  PE: Elderly, pale GENERAL: Not in distress  ABDOMEN: Soft, nondistended abdomen EXTREMITIES: No deformity  Lab Results: Results for orders placed or performed during the hospital encounter of 08/13/21 (from the past 48 hour(s))  Hemoglobin and hematocrit, blood     Status: Abnormal   Collection Time: 08/13/21  7:54 PM  Result Value Ref Range   Hemoglobin 5.6 (LL) 12.0 - 15.0 g/dL    Comment: REPEATED TO VERIFY THIS CRITICAL RESULT HAS VERIFIED AND BEEN CALLED TO C. IDOL, RN BY DANIELLE LONG ON 07 28 2023 AT 2018, AND HAS BEEN READ BACK.     HCT 17.5 (L) 36.0 - 46.0 %    Comment: Performed at [facility], [place] [address], [place], [place] [address]  Prepare RBC (crossmatch)     Status: None   Collection Time: 08/13/21  8:27 PM  Result Value Ref Range   Order Confirmation      ORDER PROCESSED BY BLOOD BANK Performed at [facility], [place] [address], [place], [place] [address]   Comprehensive metabolic panel     Status: Abnormal   Collection Time: 08/14/21 10:16 AM  Result Value Ref Range   Sodium 137 135 - 145 mmol/L   Potassium 3.9 3.5 - 5.1 mmol/L   Chloride 112 (H) 98 - 111 mmol/L   CO2 18 (L) 22 - 32 mmol/L   Glucose, Bld 118 (H) 70 - 99 mg/dL    Comment: Glucose reference range applies only to samples taken after fasting for at least 8 hours.   BUN 31 (H) 8 - 23 mg/dL   Creatinine, Ser 1.34 (H) 0.44 - 1.00 mg/dL   Calcium 7.9 (L) 8.9 - 10.3 mg/dL   Total Protein 4.2 (L) 6.5 - 8.1 g/dL   Albumin 2.4 (L) 3.5 - 5.0 g/dL   AST 22 15 - 41 U/L   ALT 11 0 - 44  U/L   Alkaline Phosphatase 48 38 - 126 U/L   Total Bilirubin 1.0 0.3 - 1.2 mg/dL   GFR, Estimated 41 (L) >60 mL/min    Comment: (NOTE) Calculated using the CKD-EPI Creatinine Equation (2021)    Anion gap 7 5 - 15    Comment: Performed at [facility], [place] [address], [place], [place] [address]  CBC     Status: Abnormal   Collection Time: 08/14/21 10:16 AM  Result Value Ref Range   WBC 8.2 4.0 - 10.5 K/uL   RBC 3.23 (L) 3.87 - 5.11 MIL/uL   Hemoglobin 9.4 (L) 12.0 - 15.0 g/dL    Comment: REPEATED TO VERIFY POST TRANSFUSION SPECIMEN    HCT 27.9 (L) 36.0 - 46.0 %   MCV 86.4 80.0 - 100.0 fL   MCH 29.1 26.0 - 34.0 pg   MCHC 33.7 30.0 - 36.0 g/dL   RDW 16.1 (H) 11.5 - 15.5 %   Platelets 142 (L) 150 - 400 K/uL   nRBC 0.0 0.0 - 0.2 %    Comment: Performed at [facility], [place] [address] [place], [place] [address]  Hemoglobin and hematocrit, blood     Status: Abnormal   Collection Time: 08/14/21  4:41 PM  Result Value Ref Range   Hemoglobin 9.1 (L) 12.0 - 15.0 g/dL   HCT 27.5 (L) 36.0 - 46.0 %    Comment: Performed at [facility], [place] [address], [place], [place] [address]  Hemoglobin and hematocrit, blood     Status: Abnormal   Collection Time: 08/14/21  9:18 PM  Result Value Ref Range   Hemoglobin 9.2 (L) 12.0 - 15.0 g/dL   HCT 27.7 (L) 36.0 - 46.0 %    Comment: Performed at [facility], [place] [address], [place], [place] [address]  Hemoglobin and hematocrit, blood     Status: Abnormal   Collection Time: 08/15/21  4:06 AM  Result Value Ref Range   Hemoglobin 9.0 (L) 12.0 - 15.0 g/dL   HCT 26.5 (L) 36.0 - 46.0 %    Comment: Performed at [facility], [place] [address], [place], [place] [address]  Comprehensive metabolic panel     Status: Abnormal   Collection Time: 08/15/21  4:06 AM  Result Value Ref Range   Sodium 138 135 - 145 mmol/L   Potassium 3.2 (L) 3.5 - 5.1 mmol/L   Chloride 109 98 - 111 mmol/L   CO2 21 (L) 22 - 32 mmol/L   Glucose, Bld  88 70 - 99 mg/dL    Comment: Glucose reference range applies only to samples taken after fasting for at least 8 hours.   BUN 17 8 - 23 mg/dL   Creatinine, Ser 1.07 (H) 0.44 - 1.00 mg/dL   Calcium 7.7 (L) 8.9 - 10.3 mg/dL   Total Protein 4.1 (L) 6.5 - 8.1 g/dL   Albumin 2.4 (L) 3.5 - 5.0 g/dL   AST 18 15 - 41 U/L   ALT 12 0 - 44 U/L   Alkaline Phosphatase 51 38 - 126 U/L   Total Bilirubin 0.4 0.3 - 1.2 mg/dL   GFR, Estimated 53 (L) >60 mL/min    Comment: (NOTE) Calculated using the CKD-EPI Creatinine Equation (2021)    Anion gap 8 5 - 15    Comment: Performed at [facility], [place] [address], [place], [place] [address]  Hemoglobin and hematocrit, blood     Status: Abnormal   Collection Time: 08/15/21 10:10 AM  Result Value Ref Range   Hemoglobin 10.0 (L) 12.0 - 15.0 g/dL   HCT 30.5 (L) 36.0 - 46.0 %    Comment: Performed at [facility], [place] [address], [place], [place] [address]    Studies/Results: No results found.  Medications: I have reviewed the patient's current medications.  Assessment: Hematochezia Hemoglobin stable posttransfusion History of duodenal AVM, cecal AVM and diverticulosis Was on Plavix, unclear about last dose,?  Possibly on Friday  Plan: On clear liquid diet and continue colonic prep EGD and colonoscopy scheduled with Dr. Alessandra Bevels for 8 AM tomorrow.  Ronnette Juniper, MD 08/15/2021, 12:00 PM

## 2021-08-15 NOTE — H&P (View-Only) (Signed)
Subjective: She has finished about half of the colonic prep, reports noticing liquid bloody stool.  Objective: Vital signs in last 24 hours: Temp:  [97.8 F (36.6 C)-98.2 F (36.8 C)] 98.2 F (36.8 C) (07/30 0200) Pulse Rate:  [65-79] 79 (07/30 0700) Resp:  [12-22] 19 (07/30 0700) BP: (98-140)/(46-102) 139/67 (07/30 0700) SpO2:  [95 %-97 %] 96 % (07/30 0905) Weight change:  Last BM Date : 08/15/21  PE: Elderly, pale GENERAL: Not in distress  ABDOMEN: Soft, nondistended abdomen EXTREMITIES: No deformity  Lab Results: Results for orders placed or performed during the hospital encounter of 08/13/21 (from the past 48 hour(s))  Hemoglobin and hematocrit, blood     Status: Abnormal   Collection Time: 08/13/21  7:54 PM  Result Value Ref Range   Hemoglobin 5.6 (LL) 12.0 - 15.0 g/dL    Comment: REPEATED TO VERIFY THIS CRITICAL RESULT HAS VERIFIED AND BEEN CALLED TO C. IDOL, RN BY DANIELLE LONG ON 07 28 2023 AT 2018, AND HAS BEEN READ BACK.     HCT 17.5 (L) 36.0 - 46.0 %    Comment: Performed at [facility], [place] [address], [place], [place] [address]  Prepare RBC (crossmatch)     Status: None   Collection Time: 08/13/21  8:27 PM  Result Value Ref Range   Order Confirmation      ORDER PROCESSED BY BLOOD BANK Performed at [facility], [place] [address], [place], [place] [address]   Comprehensive metabolic panel     Status: Abnormal   Collection Time: 08/14/21 10:16 AM  Result Value Ref Range   Sodium 137 135 - 145 mmol/L   Potassium 3.9 3.5 - 5.1 mmol/L   Chloride 112 (H) 98 - 111 mmol/L   CO2 18 (L) 22 - 32 mmol/L   Glucose, Bld 118 (H) 70 - 99 mg/dL    Comment: Glucose reference range applies only to samples taken after fasting for at least 8 hours.   BUN 31 (H) 8 - 23 mg/dL   Creatinine, Ser 1.34 (H) 0.44 - 1.00 mg/dL   Calcium 7.9 (L) 8.9 - 10.3 mg/dL   Total Protein 4.2 (L) 6.5 - 8.1 g/dL   Albumin 2.4 (L) 3.5 - 5.0 g/dL   AST 22 15 - 41 U/L   ALT 11 0 - 44  U/L   Alkaline Phosphatase 48 38 - 126 U/L   Total Bilirubin 1.0 0.3 - 1.2 mg/dL   GFR, Estimated 41 (L) >60 mL/min    Comment: (NOTE) Calculated using the CKD-EPI Creatinine Equation (2021)    Anion gap 7 5 - 15    Comment: Performed at [facility], [place] [address] [place], [place] [address]  CBC     Status: Abnormal   Collection Time: 08/14/21 10:16 AM  Result Value Ref Range   WBC 8.2 4.0 - 10.5 K/uL   RBC 3.23 (L) 3.87 - 5.11 MIL/uL   Hemoglobin 9.4 (L) 12.0 - 15.0 g/dL    Comment: REPEATED TO VERIFY POST TRANSFUSION SPECIMEN    HCT 27.9 (L) 36.0 - 46.0 %   MCV 86.4 80.0 - 100.0 fL   MCH 29.1 26.0 - 34.0 pg   MCHC 33.7 30.0 - 36.0 g/dL   RDW 16.1 (H) 11.5 - 15.5 %   Platelets 142 (L) 150 - 400 K/uL   nRBC 0.0 0.0 - 0.2 %    Comment: Performed at [facility], [place] [address] [place], [place] [address]  Hemoglobin and hematocrit, blood     Status: Abnormal   Collection Time: 08/14/21  4:41 PM  Result Value Ref Range   Hemoglobin 9.1 (L) 12.0 - 15.0 g/dL   HCT 27.5 (L) 36.0 - 46.0 %    Comment: Performed at [facility], [place] [address], [place], [place] [address]  Hemoglobin and hematocrit, blood     Status: Abnormal   Collection Time: 08/14/21  9:18 PM  Result Value Ref Range   Hemoglobin 9.2 (L) 12.0 - 15.0 g/dL   HCT 27.7 (L) 36.0 - 46.0 %    Comment: Performed at [facility], [place] [address] [place], [place] [address]  Hemoglobin and hematocrit, blood     Status: Abnormal   Collection Time: 08/15/21  4:06 AM  Result Value Ref Range   Hemoglobin 9.0 (L) 12.0 - 15.0 g/dL   HCT 26.5 (L) 36.0 - 46.0 %    Comment: Performed at [facility], [place] [address], [place], [place] [address]  Comprehensive metabolic panel     Status: Abnormal   Collection Time: 08/15/21  4:06 AM  Result Value Ref Range   Sodium 138 135 - 145 mmol/L   Potassium 3.2 (L) 3.5 - 5.1 mmol/L   Chloride 109 98 - 111 mmol/L   CO2 21 (L) 22 - 32 mmol/L   Glucose, Bld  88 70 - 99 mg/dL    Comment: Glucose reference range applies only to samples taken after fasting for at least 8 hours.   BUN 17 8 - 23 mg/dL   Creatinine, Ser 1.07 (H) 0.44 - 1.00 mg/dL   Calcium 7.7 (L) 8.9 - 10.3 mg/dL   Total Protein 4.1 (L) 6.5 - 8.1 g/dL   Albumin 2.4 (L) 3.5 - 5.0 g/dL   AST 18 15 - 41 U/L   ALT 12 0 - 44 U/L   Alkaline Phosphatase 51 38 - 126 U/L   Total Bilirubin 0.4 0.3 - 1.2 mg/dL   GFR, Estimated 53 (L) >60 mL/min    Comment: (NOTE) Calculated using the CKD-EPI Creatinine Equation (2021)    Anion gap 8 5 - 15    Comment: Performed at [facility], [place] [address], [place], [place] [address]  Hemoglobin and hematocrit, blood     Status: Abnormal   Collection Time: 08/15/21 10:10 AM  Result Value Ref Range   Hemoglobin 10.0 (L) 12.0 - 15.0 g/dL   HCT 30.5 (L) 36.0 - 46.0 %    Comment: Performed at [facility], [place] [address] [place], [place] [address]    Studies/Results: No results found.  Medications: I have reviewed the patient's current medications.  Assessment: Hematochezia Hemoglobin stable posttransfusion History of duodenal AVM, cecal AVM and diverticulosis Was on Plavix, unclear about last dose,?  Possibly on Friday  Plan: On clear liquid diet and continue colonic prep EGD and colonoscopy scheduled with Dr. Alessandra Bevels for 8 AM tomorrow.  Ronnette Juniper, MD 08/15/2021, 12:00 PM

## 2021-08-15 NOTE — Progress Notes (Signed)
  Progress Note   Patient: [name] MIW:[id number] DOB: [birth year] DOA: 08/13/2021     2 DOS: the patient was seen and examined on 08/15/2021   Brief hospital course: 76 y.o. female with medical history significant for acute gastritis treated with argon plasma coagulation in 2020, prior CVA on Plavix, post loop recorder placement, asthma, chronic anxiety/depression, who presented to [facility] ED with complaints of sudden onset painless rectal bleeding associated with 1 episode of emesis on the day of presentation.  No abdominal pain.  No subjective fevers.  Last dose of home Plavix was taken on 08/12/21 AM.   Upon presentation to the ED, lab studies revealed significant anemia with hemoglobin of 6.2 from 7.6 at baseline.  2 units PRBCs ordered to be transfused by EDP.  Home Plavix held, EDP consulted [facility] Watt Climes.  TRH, hospitalist service, was asked to admit  Assessment and Plan: Painless rectal bleeding Acute blood loss anemia Iron deficiency anemia On Plavix prior to admission, now on hold. On protonix drip Required total 4 units PRBC's this visit with hgb remaining stable at 10 GI following, plan for bowel prep this weekend with EGD/colon on 7/31 Repeat cbc in AM   Non anion gap metabolic acidosis Serum bicarb 17, anion gap 11 Improved with IVF hydration Repeat bmet in AM   Essential hypertension, BPs are soft Hold off home oral antihypertensives in the setting of GI bleed to avoid hypotension. Monitor vital signs.   Hyperlipidemia The patient is on Lipitor prior to admission.   Chronic anxiety/depression Resume home regimen   GERD On protonix drip Resume home PPI when appropriate   Legs cramps Robaxin could be poorly tolerated in the elderly Gabapentin 100 mg x 1 ordered at time of admit     Subjective: Requesting eye drops this AM  Physical Exam: Vitals:   08/15/21 0300 08/15/21 0700 08/15/21 0905 08/15/21 1200  BP:  139/67  (!) 125/59  Pulse:  79     Resp: '20 19  18  '$ Temp:      TempSrc:      SpO2:  95% 96%    General exam: Awake, laying in bed, in nad Respiratory system: Normal respiratory effort, no wheezing Cardiovascular system: regular rate, s1, s2 Gastrointestinal system: Soft, nondistended, positive BS Central nervous system: CN2-12 grossly intact, strength intact Extremities: Perfused, no clubbing Skin: Normal skin turgor, no notable skin lesions seen Psychiatry: Mood normal // no visual hallucinations   Data Reviewed:  Labs reviewed: Na 138, Cr 1.07, Hgb 10)  Family Communication: Pt in room, family not at bedside  Disposition: Status is: Inpatient Remains inpatient appropriate because: Severity of illness  Planned Discharge Destination: Home   Author: Marylu Lund, MD 08/15/2021 5:49 PM  For on call review [email or web address].

## 2021-08-16 ENCOUNTER — Inpatient Hospital Stay (HOSPITAL_COMMUNITY): Payer: Medicare PPO | Admitting: Certified Registered"

## 2021-08-16 ENCOUNTER — Encounter (HOSPITAL_COMMUNITY)
Admission: EM | Disposition: A | Discharge status: Home / Self Care | Payer: Self-pay | Source: Home / Self Care | Attending: Internal Medicine

## 2021-08-16 ENCOUNTER — Encounter (HOSPITAL_COMMUNITY): Payer: Self-pay | Admitting: Internal Medicine

## 2021-08-16 DIAGNOSIS — K635 Polyp of colon: Secondary | ICD-10-CM | POA: Diagnosis not present

## 2021-08-16 DIAGNOSIS — K922 Gastrointestinal hemorrhage, unspecified: Secondary | ICD-10-CM | POA: Diagnosis not present

## 2021-08-16 DIAGNOSIS — K5520 Angiodysplasia of colon without hemorrhage: Secondary | ICD-10-CM

## 2021-08-16 DIAGNOSIS — K31819 Angiodysplasia of stomach and duodenum without bleeding: Secondary | ICD-10-CM | POA: Diagnosis not present

## 2021-08-16 DIAGNOSIS — K5730 Diverticulosis of large intestine without perforation or abscess without bleeding: Secondary | ICD-10-CM

## 2021-08-16 DIAGNOSIS — E872 Acidosis, unspecified: Secondary | ICD-10-CM | POA: Diagnosis not present

## 2021-08-16 HISTORY — PX: HEMOSTASIS CLIP PLACEMENT: SHX6857

## 2021-08-16 HISTORY — PX: SCLEROTHERAPY: SHX6841

## 2021-08-16 HISTORY — PX: COLONOSCOPY WITH PROPOFOL: SHX5780

## 2021-08-16 HISTORY — PX: HOT HEMOSTASIS: SHX5433

## 2021-08-16 HISTORY — PX: BIOPSY: SHX5522

## 2021-08-16 HISTORY — PX: ESOPHAGOGASTRODUODENOSCOPY (EGD) WITH PROPOFOL: SHX5813

## 2021-08-16 HISTORY — PX: POLYPECTOMY: SHX5525

## 2021-08-16 LAB — CBC
HCT: 29.6 % — ABNORMAL LOW (ref 36.0–46.0)
Hemoglobin: 9.7 g/dL — ABNORMAL LOW (ref 12.0–15.0)
MCH: 28.9 pg (ref 26.0–34.0)
MCHC: 32.8 g/dL (ref 30.0–36.0)
MCV: 88.1 fL (ref 80.0–100.0)
Platelets: 186 10*3/uL (ref 150–400)
RBC: 3.36 MIL/uL — ABNORMAL LOW (ref 3.87–5.11)
RDW: 15.6 % — ABNORMAL HIGH (ref 11.5–15.5)
WBC: 10.1 10*3/uL (ref 4.0–10.5)
nRBC: 0 % (ref 0.0–0.2)

## 2021-08-16 LAB — COMPREHENSIVE METABOLIC PANEL
ALT: 14 U/L (ref 0–44)
AST: 19 U/L (ref 15–41)
Albumin: 2.7 g/dL — ABNORMAL LOW (ref 3.5–5.0)
Alkaline Phosphatase: 68 U/L (ref 38–126)
Anion gap: 5 (ref 5–15)
BUN: 9 mg/dL (ref 8–23)
CO2: 25 mmol/L (ref 22–32)
Calcium: 8.3 mg/dL — ABNORMAL LOW (ref 8.9–10.3)
Chloride: 110 mmol/L (ref 98–111)
Creatinine, Ser: 1.13 mg/dL — ABNORMAL HIGH (ref 0.44–1.00)
GFR, Estimated: 50 mL/min — ABNORMAL LOW (ref >60)
Glucose, Bld: 106 mg/dL — ABNORMAL HIGH (ref 70–99)
Potassium: 4 mmol/L (ref 3.5–5.1)
Sodium: 140 mmol/L (ref 135–145)
Total Bilirubin: 0.8 mg/dL (ref 0.3–1.2)
Total Protein: 4.7 g/dL — ABNORMAL LOW (ref 6.5–8.1)

## 2021-08-16 SURGERY — ESOPHAGOGASTRODUODENOSCOPY (EGD) WITH PROPOFOL
Anesthesia: Monitor Anesthesia Care

## 2021-08-16 MED ORDER — EPINEPHRINE 1 MG/10ML IJ SOSY
PREFILLED_SYRINGE | INTRAMUSCULAR | Status: AC
  Filled 2021-08-16: qty 10

## 2021-08-16 MED ORDER — PROPOFOL 500 MG/50ML IV EMUL
INTRAVENOUS | Status: DC | PRN
  Administered 2021-08-16: 75 ug/kg/min via INTRAVENOUS

## 2021-08-16 MED ORDER — EPINEPHRINE 1 MG/10ML IJ SOSY
PREFILLED_SYRINGE | INTRAMUSCULAR | Status: DC | PRN
  Administered 2021-08-16: 0.1 mg via SUBCUTANEOUS

## 2021-08-16 MED ORDER — SODIUM CHLORIDE 0.9 % IV SOLN: INTRAVENOUS | Status: DC

## 2021-08-16 MED ORDER — PANTOPRAZOLE SODIUM 40 MG PO TBEC
40.0000 mg | DELAYED_RELEASE_TABLET | Freq: Every day | ORAL | Status: DC
  Administered 2021-08-16 – 2021-08-18 (×3): 40 mg via ORAL
  Filled 2021-08-16 (×3): qty 1

## 2021-08-16 SURGICAL SUPPLY — 25 items

## 2021-08-16 NOTE — Interval H&P Note (Signed)
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  The various methods of treatment have been discussed with the patient and family. After consideration of risks, benefits and other options for treatment, the patient has consented to  Procedure(s): ESOPHAGOGASTRODUODENOSCOPY (EGD) WITH PROPOFOL (N/A) COLONOSCOPY WITH PROPOFOL (N/A) as a surgical intervention.  The patient's history has been reviewed, patient examined, no change in status, stable for surgery.  I have reviewed the patient's chart and labs.  Questions were answered to the patient's satisfaction.     Dayveon Halley

## 2021-08-16 NOTE — Progress Notes (Signed)
  Progress Note   Patient: [name] KWI:[id number] DOB: [birth year] DOA: 08/13/2021     3 DOS: the patient was seen and examined on 08/16/2021   Brief hospital course: 76 y.o. female with medical history significant for acute gastritis treated with argon plasma coagulation in 2020, prior CVA on Plavix, post loop recorder placement, asthma, chronic anxiety/depression, who presented to [facility] ED with complaints of sudden onset painless rectal bleeding associated with 1 episode of emesis on the day of presentation.  No abdominal pain.  No subjective fevers.  Last dose of home Plavix was taken on 08/12/21 AM.   Upon presentation to the ED, lab studies revealed significant anemia with hemoglobin of 6.2 from 7.6 at baseline.  2 units PRBCs ordered to be transfused by EDP.  Home Plavix held, EDP consulted [facility] W[facility] Climes.  TRH, hospitalist service, was asked to admit  Assessment and Plan: Painless rectal bleeding Acute blood loss anemia Iron deficiency anemia On Plavix prior to admission, now on hold. On protonix drip Required total 4 units PRBC's this visit with hgb remaining stable at 9.7 GI following, pt now s/p EGD and colon with findings of hemorrhoids, angioectasias in the colon and SB, treated with APC, and 3 colonic polyps which were removed Per GI, can resume plavix tomorrow if stable Repeat cbc in AM   Non anion gap metabolic acidosis Serum bicarb 17, anion gap 11 Improved with IVF hydration   Essential hypertension, BPs are soft Hold off home oral antihypertensives in the setting of GI bleed to avoid hypotension. Monitor vital signs.   Hyperlipidemia The patient is on Lipitor prior to admission.   Chronic anxiety/depression Resume home regimen   GERD Was on protonix drip Cont home PPI   Legs cramps Robaxin could be poorly tolerated in the elderly Gabapentin 100 mg x 1 ordered at time of admit     Subjective: Pt seen after endoscopy. Without issues  Physical  Exam: Vitals:   08/16/21 0930 08/16/21 0945 08/16/21 0959 08/16/21 1148  BP: 125/62 (!) 133/56 (!) 126/59 (!) 143/72  Pulse: 69 62 (!) 59 61  Resp: '18 18 16   '$ Temp:   97.8 F (36.6 C) 97.8 F (36.6 C)  TempSrc:    Oral  SpO2: 97% 98% 97% 100%  Weight:      Height:       General exam: Conversant, in no acute distress Respiratory system: normal chest rise, clear, no audible wheezing Cardiovascular system: regular rhythm, s1-s2 Gastrointestinal system: Nondistended, nontender, pos BS Central nervous system: No seizures, no tremors Extremities: No cyanosis, no joint deformities Skin: No rashes, no pallor Psychiatry: Affect normal // no auditory hallucinations   Data Reviewed:  Labs reviewed: Na 140, Cr 1.13, Hgb 9.7)  Family Communication: Pt in room, family not at bedside  Disposition: Status is: Inpatient Remains inpatient appropriate because: Severity of illness  Planned Discharge Destination: Home   Author: Marylu Lund, MD 08/16/2021 2:45 PM  For on call review [email or web address].

## 2021-08-16 NOTE — Anesthesia Procedure Notes (Signed)
Procedure Name: MAC Date/Time: 08/16/2021 6:30 PM  Performed by: Griffin Dakin, CRNAPre-anesthesia Checklist: Patient identified, Emergency Drugs available, Suction available, Patient being monitored and Timeout performed Patient Re-evaluated:Patient Re-evaluated prior to induction Oxygen Delivery Method: Nasal cannula Induction Type: IV induction Placement Confirmation: positive ETCO2 and breath sounds checked- equal and bilateral Dental Injury: Teeth and Oropharynx as per pre-operative assessment

## 2021-08-16 NOTE — Op Note (Signed)
[facility] Patient Name: [name] Procedure Date : 08/16/2021 MRN: [id number] Attending MD: Otis Brace , MD Date of Birth: 02-20-1945 CSN: [id number] Age: 76 Admit Type: Inpatient Procedure:                Upper GI endoscopy Indications:              Arteriovenous malformation in the small intestine,                            Recent gastrointestinal bleeding Providers:                Otis Brace, MD, Janee Morn, Technician,                            Gabriel Earing, RN Referring MD:              Medicines:                Sedation Administered by an Anesthesia Professional Complications:            No immediate complications. Estimated Blood Loss:     Estimated blood loss was minimal. Procedure:                Pre-Anesthesia Assessment:                           - Prior to the procedure, a History and Physical                            was performed, and patient medications and                            allergies were reviewed. The patient's tolerance of                            previous anesthesia was also reviewed. The risks                            and benefits of the procedure and the sedation                            options and risks were discussed with the patient.                            All questions were answered, and informed consent                            was obtained. Prior Anticoagulants: The patient has                            taken Plavix (clopidogrel), last dose was 4 days                            prior to procedure. ASA Grade Assessment: III - A  patient with severe systemic disease. After                            reviewing the risks and benefits, the patient was                            deemed in satisfactory condition to undergo the                            procedure.                           After obtaining informed consent, the endoscope was                             passed under direct vision. Throughout the                            procedure, the patient's blood pressure, pulse, and                            oxygen saturations were monitored continuously. The                            [id number]-[id number] ([id number]) Olympus endoscope was introduced                            through the mouth, and advanced to the second part                            of duodenum. The upper GI endoscopy was                            accomplished without difficulty. The patient                            tolerated the procedure well. Scope In: Scope Out: Findings:      The Z-line was regular and was found 38 cm from the incisors. Inlet       patch seen in the upper esophagus.      No gross lesions were noted in the entire examined stomach.      The cardia and gastric fundus were normal on retroflexion.      Two small angioectasias without bleeding were found in the duodenal       bulb. Fulguration to ablate the lesion by argon plasma was successful.      The first portion of the duodenum and second portion of the duodenum       were normal. Impression:               - Z-line regular, 38 cm from the incisors.                           - No gross lesions in the stomach.                           -  Two non-bleeding angioectasias in the duodenum.                            Treated with argon plasma coagulation (APC).                           - Normal first portion of the duodenum and second                            portion of the duodenum.                           - No specimens collected. Recommendation:           - Perform a colonoscopy today. Procedure Code(s):        --- Professional ---                           [phone], Esophagogastroduodenoscopy, flexible,                            transoral; with ablation of tumor(s), polyp(s), or                            other lesion(s) (includes pre- and post-dilation                            and guide wire passage, when  performed) Diagnosis Code(s):        --- Professional ---                           B63.893, Angiodysplasia of stomach and duodenum                            without bleeding                           K92.2, Gastrointestinal hemorrhage, unspecified CPT copyright 2019 American Medical Association. All rights reserved. The codes documented in this report are preliminary and upon coder review may  be revised to meet current compliance requirements. Otis Brace, MD Otis Brace, MD 08/16/2021 9:25:07 AM Number of Addenda: 0

## 2021-08-16 NOTE — Anesthesia Postprocedure Evaluation (Signed)
Anesthesia Post Note  Patient: [name]  Procedure(s) Performed: ESOPHAGOGASTRODUODENOSCOPY (EGD) WITH PROPOFOL HOT HEMOSTASIS (ARGON PLASMA COAGULATION/BICAP) POLYPECTOMY HEMOSTASIS CLIP PLACEMENT SCLEROTHERAPY BIOPSY COLONOSCOPY WITH PROPOFOL     Patient location during evaluation: PACU Anesthesia Type: MAC Level of consciousness: awake and alert Pain management: pain level controlled Vital Signs Assessment: post-procedure vital signs reviewed and stable Respiratory status: spontaneous breathing, nonlabored ventilation, respiratory function stable and patient connected to nasal cannula oxygen Cardiovascular status: stable and blood pressure returned to baseline Postop Assessment: no apparent nausea or vomiting Anesthetic complications: no   No notable events documented.  Last Vitals:  Vitals:   08/16/21 0945 08/16/21 0959  BP: (!) 133/56 (!) 126/59  Pulse: 62 (!) 59  Resp: 18 16  Temp:  36.6 C  SpO2: 98% 97%    Last Pain:  Vitals:   08/16/21 0959  TempSrc:   PainSc: 0-No pain                 Effie Berkshire

## 2021-08-16 NOTE — Anesthesia Preprocedure Evaluation (Addendum)
Anesthesia Evaluation  Patient identified by MRN, date of birth, ID band Patient awake    Reviewed: Allergy & Precautions, NPO status , Patient's Chart, lab work & pertinent test results  Airway Mallampati: II  TM Distance: >3 FB Neck ROM: Full    Dental  (+) Caps, Edentulous Lower, Dental Advisory Given   Pulmonary asthma , sleep apnea , COPD, former smoker,    breath sounds clear to auscultation       Cardiovascular hypertension, + Peripheral Vascular Disease   Rhythm:Regular Rate:Normal     Neuro/Psych PSYCHIATRIC DISORDERS Depression CVA    GI/Hepatic negative GI ROS, Neg liver ROS,   Endo/Other  negative endocrine ROS  Renal/GU negative Renal ROS     Musculoskeletal negative musculoskeletal ROS (+)   Abdominal Normal abdominal exam  (+)   Peds  Hematology negative hematology ROS (+)   Anesthesia Other Findings   Reproductive/Obstetrics                           Anesthesia Physical Anesthesia Plan  ASA: 3  Anesthesia Plan: MAC   Post-op Pain Management:    Induction: Intravenous  PONV Risk Score and Plan: 0 and Propofol infusion  Airway Management Planned: Natural Airway and Simple Face Mask  Additional Equipment: None  Intra-op Plan:   Post-operative Plan:   Informed Consent:   Plan Discussed with: CRNA  Anesthesia Plan Comments:         Anesthesia Quick Evaluation

## 2021-08-16 NOTE — Transfer of Care (Signed)
Immediate Anesthesia Transfer of Care Note  Patient: [name]  Procedure(s) Performed: ESOPHAGOGASTRODUODENOSCOPY (EGD) WITH PROPOFOL HOT HEMOSTASIS (ARGON PLASMA COAGULATION/BICAP) POLYPECTOMY HEMOSTASIS CLIP PLACEMENT SCLEROTHERAPY BIOPSY COLONOSCOPY WITH PROPOFOL  Patient Location: PACU  Anesthesia Type:MAC  Level of Consciousness: drowsy  Airway & Oxygen Therapy: Patient Spontanous Breathing and Patient connected to nasal cannula oxygen  Post-op Assessment: Report given to RN and Post -op Vital signs reviewed and stable  Post vital signs: Reviewed and stable  Last Vitals:  Vitals Value Taken Time  BP 115/55 08/16/21 0925  Temp    Pulse 64 08/16/21 0927  Resp 20 08/16/21 0927  SpO2 98 % 08/16/21 0927  Vitals shown include unvalidated device data.  Last Pain:  Vitals:   08/16/21 0735  TempSrc: Temporal  PainSc: 4       Patients Stated Pain Goal: 0 (01/65/53 7482)  Complications: No notable events documented.

## 2021-08-16 NOTE — Op Note (Signed)
[facility] Patient Name: [name] Procedure Date : 08/16/2021 MRN: [id number] Attending MD: Otis Brace , MD Date of Birth: 23-Sep-1945 CSN: [id number] Age: 76 Admit Type: Inpatient Procedure:                Colonoscopy Indications:              Rectal bleeding, Arteriovenous malformation in the                            large intestine Providers:                Otis Brace, MD, Janee Morn, Technician,                            Gabriel Earing, RN Referring MD:              Medicines:                Sedation Administered by an Anesthesia Professional Complications:            No immediate complications. Estimated Blood Loss:     Estimated blood loss was minimal. Procedure:                Pre-Anesthesia Assessment:                           - Prior to the procedure, a History and Physical                            was performed, and patient medications and                            allergies were reviewed. The patient's tolerance of                            previous anesthesia was also reviewed. The risks                            and benefits of the procedure and the sedation                            options and risks were discussed with the patient.                            All questions were answered, and informed consent                            was obtained. Prior Anticoagulants: The patient has                            taken Plavix (clopidogrel), last dose was 4 days                            prior to procedure. ASA Grade Assessment: III - A  patient with severe systemic disease. After                            reviewing the risks and benefits, the patient was                            deemed in satisfactory condition to undergo the                            procedure.                           - Prior to the procedure, a History and Physical                            was performed, and  patient medications and                            allergies were reviewed. The patient's tolerance of                            previous anesthesia was also reviewed. The risks                            and benefits of the procedure and the sedation                            options and risks were discussed with the patient.                            All questions were answered, and informed consent                            was obtained. Prior Anticoagulants: The patient has                            taken Plavix (clopidogrel), last dose was 3 days                            prior to procedure. ASA Grade Assessment: III - A                            patient with severe systemic disease. After                            reviewing the risks and benefits, the patient was                            deemed in satisfactory condition to undergo the                            procedure.  After obtaining informed consent, the colonoscope                            was passed under direct vision. Throughout the                            procedure, the patient's blood pressure, pulse, and                            oxygen saturations were monitored continuously. The                            [id number]-[id number] ([id number] Olympus peds colonoscope was                            introduced through the anus and advanced to the the                            terminal ileum, with identification of the                            appendiceal orifice and IC valve. The colonoscopy                            was performed without difficulty. The patient                            tolerated the procedure well. The quality of the                            bowel preparation was good except the sigmoid colon                            was fair. Scope In: 8:51:03 AM Scope Out: 9:17:20 AM Scope Withdrawal Time: 0 hours 16 minutes 56 seconds  Total Procedure Duration: 0 hours 26 minutes  17 seconds  Findings:      Hemorrhoids were found on perianal exam.      The terminal ileum appeared normal.      A single large localized angioectasia with bleeding on contact was found       in the cecum. Fulguration to ablate the lesion by argon plasma was       unsuccessful. For hemostasis, two hemostatic clips were successfully       placed. There was no bleeding during, or at the end, of the procedure.       Area was successfully injected with 1 mL of a 1:10,000 solution of       epinephrine for hemostasis.      Three medium-sized patchy angioectasias without bleeding were found in       the ascending colon and in the cecum. Fulguration to ablate the lesion       by argon plasma was successful.      Three sessile polyps were found in the transverse colon. The polyps were       4 to 7 mm in size. These polyps were removed with a cold snare.  Resection and retrieval were complete.      Many diverticula were found in the sigmoid colon and descending colon.      Many hyperplastic polyps were found in the rectum. The polyps were small       in size. Biopsies were taken with a cold forceps for histology.      Internal hemorrhoids were found during retroflexion. The hemorrhoids       were medium-sized. Impression:               - Hemorrhoids found on perianal exam.                           - The examined portion of the ileum was normal.                           - A single colonic angioectasia. Treatment not                            successful. Treated with argon plasma coagulation                            (APC). Clips were placed. Injected.                           - Three non-bleeding colonic angioectasias. Treated                            with argon plasma coagulation (APC).                           - Three 4 to 7 mm polyps in the transverse colon,                            removed with a cold snare. Resected and retrieved.                           - Diverticulosis in  the sigmoid colon and in the                            descending colon.                           - Many small polyps in the rectum. Biopsied.                           - Internal hemorrhoids. Recommendation:           - Return patient to hospital ward for ongoing care.                           - Soft diet.                           - Continue present medications.                           -  Await pathology results.                           - Resume Plavix (clopidogrel) at prior dose                            tomorrow. Procedure Code(s):        --- Professional ---                           [phone], Colonoscopy, flexible; with ablation of                            tumor(s), polyp(s), or other lesion(s) (includes                            pre- and post-dilation and guide wire passage, when                            performed)                           45382, 59, Colonoscopy, flexible; with control of                            bleeding, any method                           45385, 59, Colonoscopy, flexible; with removal of                            tumor(s), polyp(s), or other lesion(s) by snare                            technique                           45380, 59, Colonoscopy, flexible; with biopsy,                            single or multiple Diagnosis Code(s):        --- Professional ---                           K64.8, Other hemorrhoids                           K55.20, Angiodysplasia of colon without hemorrhage                           K63.5, Polyp of colon                           K62.1, Rectal polyp                           K62.5, Hemorrhage of anus and rectum  K57.30, Diverticulosis of large intestine without                            perforation or abscess without bleeding CPT copyright 2019 American Medical Association. All rights reserved. The codes documented in this report are preliminary and upon coder review may  be revised to meet  current compliance requirements. Otis Brace, MD Otis Brace, MD 08/16/2021 9:48:35 AM Number of Addenda: 0

## 2021-08-17 ENCOUNTER — Encounter (HOSPITAL_COMMUNITY): Payer: Self-pay | Admitting: Gastroenterology

## 2021-08-17 DIAGNOSIS — K922 Gastrointestinal hemorrhage, unspecified: Secondary | ICD-10-CM | POA: Diagnosis not present

## 2021-08-17 LAB — COMPREHENSIVE METABOLIC PANEL
ALT: 12 U/L (ref 0–44)
AST: 14 U/L — ABNORMAL LOW (ref 15–41)
Albumin: 2.5 g/dL — ABNORMAL LOW (ref 3.5–5.0)
Alkaline Phosphatase: 68 U/L (ref 38–126)
Anion gap: 6 (ref 5–15)
BUN: 17 mg/dL (ref 8–23)
CO2: 25 mmol/L (ref 22–32)
Calcium: 8.4 mg/dL — ABNORMAL LOW (ref 8.9–10.3)
Chloride: 107 mmol/L (ref 98–111)
Creatinine, Ser: 1.76 mg/dL — ABNORMAL HIGH (ref 0.44–1.00)
GFR, Estimated: 29 mL/min — ABNORMAL LOW (ref >60)
Glucose, Bld: 128 mg/dL — ABNORMAL HIGH (ref 70–99)
Potassium: 3.9 mmol/L (ref 3.5–5.1)
Sodium: 138 mmol/L (ref 135–145)
Total Bilirubin: 0.7 mg/dL (ref 0.3–1.2)
Total Protein: 4.5 g/dL — ABNORMAL LOW (ref 6.5–8.1)

## 2021-08-17 LAB — CBC
HCT: 28.8 % — ABNORMAL LOW (ref 36.0–46.0)
Hemoglobin: 9.6 g/dL — ABNORMAL LOW (ref 12.0–15.0)
MCH: 29.7 pg (ref 26.0–34.0)
MCHC: 33.3 g/dL (ref 30.0–36.0)
MCV: 89.2 fL (ref 80.0–100.0)
Platelets: 196 10*3/uL (ref 150–400)
RBC: 3.23 MIL/uL — ABNORMAL LOW (ref 3.87–5.11)
RDW: 15.6 % — ABNORMAL HIGH (ref 11.5–15.5)
WBC: 8.7 10*3/uL (ref 4.0–10.5)
nRBC: 0 % (ref 0.0–0.2)

## 2021-08-17 LAB — SURGICAL PATHOLOGY

## 2021-08-17 MED ORDER — CLOPIDOGREL BISULFATE 75 MG PO TABS
75.0000 mg | ORAL_TABLET | Freq: Every day | ORAL | Status: DC
  Administered 2021-08-17 – 2021-08-18 (×2): 75 mg via ORAL
  Filled 2021-08-17 (×2): qty 1

## 2021-08-17 MED ORDER — SODIUM CHLORIDE 0.9 % IV SOLN: INTRAVENOUS | Status: DC

## 2021-08-17 NOTE — Progress Notes (Signed)
  Transition of Care [facility]) Screening Note   Patient Details  Name: [name] Date of Birth: 04-26-1945   Transition of Care [facility] CM/SW Contact:    Cyndi Bender, RN Phone Number: 08/17/2021, 8:44 AM    Transition of Care Department [facility]) has reviewed patient and no TOC needs have been identified at this time. We will continue to monitor patient advancement through interdisciplinary progression rounds. If new patient transition needs arise, please place a TOC consult.

## 2021-08-17 NOTE — Progress Notes (Signed)
  Progress Note   Patient: [name] TKW:[id number] DOB: [birth year] DOA: 08/13/2021     4 DOS: the patient was seen and examined on 08/17/2021   Brief hospital course: 76 y.o. female with medical history significant for acute gastritis treated with argon plasma coagulation in 2020, prior CVA on Plavix, post loop recorder placement, asthma, chronic anxiety/depression, who presented to [facility] ED with complaints of sudden onset painless rectal bleeding associated with 1 episode of emesis on the day of presentation.  No abdominal pain.  No subjective fevers.  Last dose of home Plavix was taken on 08/12/21 AM.   Upon presentation to the ED, lab studies revealed significant anemia with hemoglobin of 6.2 from 7.6 at baseline.  2 units PRBCs ordered to be transfused by EDP.  Home Plavix held, EDP consulted [facility] Watt Climes.  TRH, hospitalist service, was asked to admit  Assessment and Plan: Painless rectal bleeding Acute blood loss anemia Iron deficiency anemia On Plavix prior to admission placed on hold on admit Required total 4 units PRBC's this visit with hgb now stable at 9.6 GI following, pt now s/p EGD and colon with findings of hemorrhoids, angioectasias in the colon and SB, treated with APC, and 3 colonic polyps which were removed Per GI, can resume plavix tomorrow if stable Repeat cbc in AM   Non anion gap metabolic acidosis Serum bicarb 17, anion gap 11 Improved with IVF hydration   Essential hypertension, BPs are soft Held off home oral antihypertensives at presentation in the setting of GI bleed to avoid hypotension. BP remains stable and well controlled at this time   Hyperlipidemia The patient is on Lipitor prior to admission.   Chronic anxiety/depression Resume home regimen   GERD Was on protonix drip Cont home PPI   Legs cramps Robaxin could be poorly tolerated in the elderly Gabapentin 100 mg x 1 ordered at time of admit  ARF -Mucus membranes dry on exam, poor  skin turgor, likely dehydrated -Started 75cc/hr NS -Repeat bmet in AM     Subjective: Without abd pain or sob this AM  Physical Exam: Vitals:   08/17/21 0741 08/17/21 0748 08/17/21 1110 08/17/21 1547  BP:  131/65 (!) 118/54 121/72  Pulse:  66 68 68  Resp: 16     Temp:  97.8 F (36.6 C) 97.9 F (36.6 C) 98 F (36.7 C)  TempSrc:  Oral Oral Oral  SpO2:  96% 98%   Weight:      Height:       General exam: Awake, laying in bed, in nad Respiratory system: Normal respiratory effort, no wheezing Cardiovascular system: regular rate, s1, s2 Gastrointestinal system: Soft, nondistended, positive BS Central nervous system: CN2-12 grossly intact, strength intact Extremities: Perfused, no clubbing Skin: Normal skin turgor, no notable skin lesions seen Psychiatry: Mood normal // no visual hallucinations   Data Reviewed:  Labs reviewed: Na 138, Cr 1.76, Hgb 9.6)  Family Communication: Pt in room, family not at bedside  Disposition: Status is: Inpatient Remains inpatient appropriate because: Severity of illness  Planned Discharge Destination: Home   Author: Marylu Lund, MD 08/17/2021 5:53 PM  For on call review [email or web address].

## 2021-08-17 NOTE — Care Management Important Message (Signed)
Important Message  Patient Details  Name: [name] MRN: [id number] Date of Birth: 1945/06/13   Medicare Important Message Given:  Yes     [name] 08/17/2021, 3:34 PM

## 2021-08-17 NOTE — Progress Notes (Signed)
[facility] Gastroenterology Progress Note  PATRICIA FARGO 76 y.o. 10/30/1945  CC: GI bleed   Subjective: Patient seen and examined at bedside.  Feeling better.  Denies any further bleeding episodes.  ROS : aFebrile, negative for chest pain   Objective: Vital signs in last 24 hours: Vitals:   08/17/21 0748 08/17/21 1110  BP: 131/65 (!) 118/54  Pulse: 66 68  Resp:    Temp: 97.8 F (36.6 C) 97.9 F (36.6 C)  SpO2: 96% 98%    Physical Exam:  General:  Alert, cooperative, no distress, appears stated age  Head:  Normocephalic, without obvious abnormality, atraumatic  Eyes:  , EOM's intact,   Lungs:   No visible respiratory distress  Heart:  Regular rate and rhythm, S1, S2 normal  Abdomen:   Soft, non-tender, nondistended, bowel sounds present, no peritoneal signs          Lab Results: Recent Labs    08/16/21 0227 08/17/21 0206  NA 140 138  K 4.0 3.9  CL 110 107  CO2 25 25  GLUCOSE 106* 128*  BUN 9 17  CREATININE 1.13* 1.76*  CALCIUM 8.3* 8.4*   Recent Labs    08/16/21 0227 08/17/21 0206  AST 19 14*  ALT 14 12  ALKPHOS 68 68  BILITOT 0.8 0.7  PROT 4.7* 4.5*  ALBUMIN 2.7* 2.5*   Recent Labs    08/16/21 0227 08/17/21 0206  WBC 10.1 8.7  HGB 9.7* 9.6*  HCT 29.6* 28.8*  MCV 88.1 89.2  PLT 186 196   No results for input(s): "LABPROT", "INR" in the last 72 hours.    Assessment/Plan: -Hematochezia.  S/p EGD and colonoscopy yesterday.  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  Colonoscopy showed 1 large cecal AVM with contact bleeding.  It was treated with APC, epinephrine injection and clips placement.  Colonoscopy also showed few other AVMs in the right side of the colon which were treated with APC's.  Hemoglobin stable  -History of CVA.  Was on Plavix  Recommendation -------------------------- -Okay to resume Plavix tomorrow from GI standpoint -No further inpatient GI work-up planned.  GI will sign off.  Call us back if  needed   Otis Brace MD, [place] 08/17/2021, 2:18 PM  Contact #  [phone]

## 2021-08-18 DIAGNOSIS — K922 Gastrointestinal hemorrhage, unspecified: Secondary | ICD-10-CM | POA: Diagnosis not present

## 2021-08-18 DIAGNOSIS — I10 Essential (primary) hypertension: Secondary | ICD-10-CM

## 2021-08-18 DIAGNOSIS — E785 Hyperlipidemia, unspecified: Secondary | ICD-10-CM

## 2021-08-18 LAB — COMPREHENSIVE METABOLIC PANEL
ALT: 10 U/L (ref 0–44)
AST: 13 U/L — ABNORMAL LOW (ref 15–41)
Albumin: 2.3 g/dL — ABNORMAL LOW (ref 3.5–5.0)
Alkaline Phosphatase: 62 U/L (ref 38–126)
Anion gap: 5 (ref 5–15)
BUN: 20 mg/dL (ref 8–23)
CO2: 21 mmol/L — ABNORMAL LOW (ref 22–32)
Calcium: 7.7 mg/dL — ABNORMAL LOW (ref 8.9–10.3)
Chloride: 111 mmol/L (ref 98–111)
Creatinine, Ser: 1.26 mg/dL — ABNORMAL HIGH (ref 0.44–1.00)
GFR, Estimated: 44 mL/min — ABNORMAL LOW (ref >60)
Glucose, Bld: 114 mg/dL — ABNORMAL HIGH (ref 70–99)
Potassium: 3.7 mmol/L (ref 3.5–5.1)
Sodium: 137 mmol/L (ref 135–145)
Total Bilirubin: 0.3 mg/dL (ref 0.3–1.2)
Total Protein: 4.3 g/dL — ABNORMAL LOW (ref 6.5–8.1)

## 2021-08-18 LAB — CBC
HCT: 27.7 % — ABNORMAL LOW (ref 36.0–46.0)
Hemoglobin: 8.8 g/dL — ABNORMAL LOW (ref 12.0–15.0)
MCH: 28.8 pg (ref 26.0–34.0)
MCHC: 31.8 g/dL (ref 30.0–36.0)
MCV: 90.5 fL (ref 80.0–100.0)
Platelets: 201 10*3/uL (ref 150–400)
RBC: 3.06 MIL/uL — ABNORMAL LOW (ref 3.87–5.11)
RDW: 15.2 % (ref 11.5–15.5)
WBC: 6.9 10*3/uL (ref 4.0–10.5)
nRBC: 0 % (ref 0.0–0.2)

## 2021-08-18 NOTE — Evaluation (Signed)
Physical Therapy Evaluation Patient Details Name: [name] MRN: [id number] DOB: 02-Sep-1945 Today's Date: 08/18/2021  History of Present Illness  76 yo female presents to ED with rectal bleeding, + emesis. Pt received 2 units PRBCs. colonoscopy 7/31 shows colonic angioectasias (treated), diverticulosis, polyps in rectum and sigmoid colon (biopsied and treated, respectively); upper GI endoscopy Two non-bleeding angioectasias in the duodenum (treated).  PMH includes acute gastritis treated with argon plasma coagulation in 2020, prior CVA on Plavix, post loop recorder placement, asthma, chronic anxiety/depression.  Clinical Impression   Pt presents with LE deconditioning and decreased activity tolerance vs baseline. Pt ambulated around unit without AD and increased time, no overt LOB but pt declines multiple balance screenings. Pt states she feels weaker vs baseline, states she anticipates getting home and feeling better. PT reviewed importance of slow re-integration into daily routine, frequent rests as needed, and up and walking multiple times a day to return to PLOF. Pt plans to d/c home today, will sign off.         Recommendations for follow up therapy are one component of a multi-disciplinary discharge planning process, led by the attending physician.  Recommendations may be updated based on patient status, additional functional criteria and insurance authorization.  Follow Up Recommendations Home health PT (pt declines services)      Assistance Recommended at Discharge PRN  Patient can return home with the following       Equipment Recommendations None recommended by PT  Recommendations for Other Services       Functional Status Assessment Patient has had a recent decline in their functional status and demonstrates the ability to make significant improvements in function in a reasonable and predictable amount of time.     Precautions / Restrictions Precautions Precautions:  Fall Restrictions Weight Bearing Restrictions: No      Mobility  Bed Mobility Overal bed mobility: Needs Assistance             General bed mobility comments: up in chair, ended session EOB    Transfers Overall transfer level: Needs assistance   Transfers: Sit to/from Stand Sit to Stand: Supervision           General transfer comment: safety    Ambulation/Gait Ambulation/Gait assistance: Supervision Gait Distance (Feet): 290 Feet Assistive device: None Gait Pattern/deviations: Step-through pattern, Decreased stride length, Shuffle Gait velocity: decr     General Gait Details: x2 standing rest breaks to recover fatigue, supervision for safety.  Stairs            Wheelchair Mobility    Modified Rankin (Stroke Patients Only)       Balance Overall balance assessment: Mild deficits observed, not formally tested                                           Pertinent Vitals/Pain Pain Assessment Pain Assessment: No/denies pain    Home Living Family/patient expects to be discharged to:: Private residence Living Arrangements: Alone Available Help at Discharge: Personal care attendant Type of Home: House Home Access: Stairs to enter Entrance Stairs-Rails: None Entrance Stairs-Number of Steps: 1   Home Layout: One level Home Equipment: Conservation officer, nature (2 wheels);Shower seat      Prior Function Prior Level of Function : Independent/Modified Independent             Mobility Comments: pt reports not using AD for mobility  ADLs Comments: pt has a caregiver who comes in 3-4 hours/day, 6x/week to assist with meal prep and household tasks, pt states she does not need help with ADLs     Hand Dominance   Dominant Hand: Right    Extremity/Trunk Assessment   Upper Extremity Assessment Upper Extremity Assessment: Defer to OT evaluation    Lower Extremity Assessment Lower Extremity Assessment: Generalized weakness    Cervical /  Trunk Assessment Cervical / Trunk Assessment: Normal  Communication   Communication: No difficulties  Cognition Arousal/Alertness: Awake/alert Behavior During Therapy: WFL for tasks assessed/performed Overall Cognitive Status: Within Functional Limits for tasks assessed                                          General Comments      Exercises     Assessment/Plan    PT Assessment Patient does not need any further PT services  PT Problem List         PT Treatment Interventions      PT Goals (Current goals can be found in the Care Plan section)  Acute Rehab PT Goals Patient Stated Goal: go home today PT Goal Formulation: With patient Time For Goal Achievement: 08/18/21 Potential to Achieve Goals: Good    Frequency       Co-evaluation               AM-PAC PT "6 Clicks" Mobility  Outcome Measure Help needed turning from your back to your side while in a flat bed without using bedrails?: None Help needed moving from lying on your back to sitting on the side of a flat bed without using bedrails?: None Help needed moving to and from a bed to a chair (including a wheelchair)?: None Help needed standing up from a chair using your arms (e.g., wheelchair or bedside chair)?: None Help needed to walk in hospital room?: A Little Help needed climbing 3-5 steps with a railing? : A Little 6 Click Score: 22    End of Session   Activity Tolerance: Patient limited by fatigue Patient left: in bed;with call bell/phone within reach Nurse Communication: Mobility status PT Visit Diagnosis: Unsteadiness on feet (R26.81);Muscle weakness (generalized) (M62.81)    Time: 1006-1020 PT Time Calculation (min) (ACUTE ONLY): 14 min   Charges:   PT Evaluation $PT Eval Low Complexity: 1 Low         Nikol Lemar S, PT DPT Acute Rehabilitation Services Pager [phone]  Office [phone]   [place] R[place] Pyo 08/18/2021, 10:48 AM

## 2021-08-18 NOTE — Discharge Summary (Signed)
PATIENT DETAILS Name: [name] Age: 76 y.o. Sex: female Date of Birth: 02/17/1945 MRN: [id number]. Admitting Physician: Kayleen Memos, DO URK:YHCWC, Jenny Reichmann, MD  Admit Date: 08/13/2021 Discharge date: 08/18/2021  Recommendations for Outpatient Follow-up:  Follow up with PCP in 1-2 weeks Please obtain CMP/CBC in one week  Admitted From:  Home  Disposition: Home   Discharge Condition: good  CODE STATUS:   Code Status: Full Code   Diet recommendation:  Diet Order             Diet - low sodium heart healthy           DIET SOFT Room service appropriate? No; Fluid consistency: Thin  Diet effective now                    Brief Summary: 76 year old female with history of CVA on Plavix-who presented with lower GI bleeding and acute blood loss anemia.  Admitted to [facility] for further evaluation and treatment-see below for further details.  Brief Hospital Course: Lower GI bleeding with acute blood loss anemia: No further GI bleeding-underwent EGD/colonoscopy-multiple AVMs were found-unlikely cause of bleeding.  Hemoglobin now stable-required total of 4 units of PRBC transfusion this hospitalization.  Further recommendations from GI-Per GI okay to resume Plavix starting today.  AKI: Likely hemodynamically mediated-managed with supportive care-has resolved  HTN: BP slowly creeping up-resume amlodipine on discharge.  Follow-up with PCP and resume Cozaar if BP still persistently elevated while on amlodipine.  HLD: Continue statin.  History of CVA: Resuming Plavix-already on statin.  Anxiety/depression: Stable-continue Lexapro  GERD: Continue PPI  BMI: Estimated body mass index is 21.87 kg/m as calculated from the following:   Height as of this encounter: 5' (1.524 m).   Weight as of this encounter: 50.8 kg.      Discharge Diagnoses:  Principal Problem:   GI bleed Acute blood loss anemia HTN HLD History of CVA Depression GERD  Discharge  Instructions:  Activity:  As tolerated   Discharge Instructions     Diet - low sodium heart healthy   Complete by: As directed    Discharge instructions   Complete by: As directed    Follow with Primary MD  Shon Baton, MD in 1-2 weeks  Resume Plavix from 8/2  Holding losartan as your blood pressure was on the lower side-follow-up with your primary care practitioner in a week or so and resume losartan if your blood pressure can tolerate it.  Currently you will only be on amlodipine.  Please get a complete blood count and chemistry panel checked by your Primary MD at your next visit, and again as instructed by your Primary MD.  Get Medicines reviewed and adjusted: Please take all your medications with you for your next visit with your Primary MD  Laboratory/radiological data: Please request your Primary MD to go over all hospital tests and procedure/radiological results at the follow up, please ask your Primary MD to get all Hospital records sent to his/her office.  In some cases, they will be blood work, cultures and biopsy results pending at the time of your discharge. Please request that your primary care M.D. follows up on these results.  Also Note the following: If you experience worsening of your admission symptoms, develop shortness of breath, life threatening emergency, suicidal or homicidal thoughts you must seek medical attention immediately by calling 911 or calling your MD immediately  if symptoms less severe.  You must read complete instructions/literature along with all  the possible adverse reactions/side effects for all the Medicines you take and that have been prescribed to you. Take any new Medicines after you have completely understood and accpet all the possible adverse reactions/side effects.   Do not drive when taking Pain medications or sleeping medications (Benzodaizepines)  Do not take more than prescribed Pain, Sleep and Anxiety Medications. It is not  advisable to combine anxiety,sleep and pain medications without talking with your primary care practitioner  Special Instructions: If you have smoked or chewed Tobacco  in the last 2 yrs please stop smoking, stop any regular Alcohol  and or any Recreational drug use.  Wear Seat belts while driving.  Please note: You were cared for by a hospitalist during your hospital stay. Once you are discharged, your primary care physician will handle any further medical issues. Please note that NO REFILLS for any discharge medications will be authorized once you are discharged, as it is imperative that you return to your primary care physician (or establish a relationship with a primary care physician if you do not have one) for your post hospital discharge needs so that they can reassess your need for medications and monitor your lab values.   Increase activity slowly   Complete by: As directed    No wound care   Complete by: As directed       Allergies as of 08/18/2021       Reactions   Codeine Itching, Other (See Comments)   Caused mouth to break out in hives   Neosporin [neomycin-bacitracin Zn-polymyx] Itching, Swelling   Swelling at application site   Percocet [oxycodone-acetaminophen] Other (See Comments)   B-blockers, triggers depression   Tussionex Pennkinetic Er [hydrocod Poli-chlorphe Poli Er] Itching        Medication List     STOP taking these medications    losartan 25 MG tablet Commonly known as: COZAAR       TAKE these medications    Albuterol Sulfate 108 (90 Base) MCG/ACT Aepb Commonly known as: PROAIR RESPICLICK Inhale 2 puffs into the lungs every 6 (six) hours as needed for shortness of breath.   amLODipine 10 MG tablet Commonly known as: NORVASC Take 1 tablet (10 mg total) by mouth daily.   atorvastatin 10 MG tablet Commonly known as: LIPITOR Take 10 mg by mouth daily.   clopidogrel 75 MG tablet Commonly known as: PLAVIX Take 1 tablet (75 mg total) by mouth  daily.   cyanocobalamin 1000 MCG tablet Commonly known as: VITAMIN B12 Take 1,000 mcg by mouth daily.   escitalopram 20 MG tablet Commonly known as: LEXAPRO Take 20 mg by mouth daily.   ferrous sulfate 325 (65 FE) MG tablet Take 1 tablet (325 mg total) by mouth daily with breakfast.   pantoprazole 40 MG tablet Commonly known as: PROTONIX Take 1 tablet (40 mg total) by mouth daily.   Vitamin D-3 25 MCG (1000 UT) Caps Take 1,000 Units by mouth daily.        Allergies  Allergen Reactions   Codeine Itching and Other (See Comments)    Caused mouth to break out in hives   Neosporin [Neomycin-Bacitracin Zn-Polymyx] Itching and Swelling    Swelling at application site   Percocet [Oxycodone-Acetaminophen] Other (See Comments)    B-blockers, triggers depression   Tussionex Pennkinetic Er [Hydrocod Poli-Chlorphe Poli Er] Itching     Other Procedures/Studies: CT ABDOMEN PELVIS W CONTRAST  Result Date: 08/07/2021 CLINICAL DATA:  Abdominal pain with bloody/watery stools for 2 weeks. History  of tongue carcinoma. EXAM: CT ABDOMEN AND PELVIS WITH CONTRAST TECHNIQUE: Multidetector CT imaging of the abdomen and pelvis was performed using the standard protocol following bolus administration of intravenous contrast. RADIATION DOSE REDUCTION: This exam was performed according to the departmental dose-optimization program which includes automated exposure control, adjustment of the mA and/or kV according to patient size and/or use of iterative reconstruction technique. CONTRAST:  146m ISOVUE-300 IOPAMIDOL (ISOVUE-300) INJECTION 61% COMPARISON:  PET-CT, 05/25/2016. FINDINGS: Lower chest: No acute abnormality. Hepatobiliary: Liver normal in size and overall attenuation. Tiny low-attenuation lesion in segment 7, consistent with a cyst but too small to characterize. No other liver masses or lesions. Prominent common bile duct measuring 7 mm, with minor central intrahepatic bile duct prominence. Normal  gallbladder. Pancreas: No pancreatic mass or inflammation. Dilated duct up to a maximum of 6 mm. Spleen: Normal spleen size. Calcification in the inferior spleen consistent with a benign process. No masses. Adrenals/Urinary Tract: No adrenal masses. Kidneys normal in size, orientation and position. Low-attenuation mass arising from the lateral midpole the right kidney, 2.6 cm. Smaller low-attenuation exophytic mass arising from the anterior lower pole the left kidney, 1.4 cm. Subcentimeter low-attenuation lesion in the midpole the left kidney. These are all consistent with cysts. No intrarenal stones. No hydronephrosis. Normal ureters. Normal bladder. Stomach/Bowel: Normal stomach. Small bowel and colon are normal in caliber. No wall thickening. No inflammation. Mild increase in the colonic stool burden. Colonic stool is liquid creating air-fluid levels along the right colon and transverse colon. Normal appendix visualized. Vascular/Lymphatic: Dense aortic atherosclerotic calcifications extending to its branch vessels. No aneurysm. No enlarged lymph nodes. Reproductive: Uterus and bilateral adnexa are unremarkable. Other: No abdominal wall hernia or abnormality. No abdominopelvic ascites. Musculoskeletal: No fracture or acute finding.  No bone lesion. IMPRESSION: 1. No acute findings within the abdomen or pelvis. 2. Mild generalized increase in the colonic stool burden with liquid stool in the right and transverse colon. This is nonspecific. No bowel inflammation. 3. Dense aortic atherosclerotic calcifications extending to the branch vessels. Electronically Signed   By: DLajean ManesM.D.   On: 08/07/2021 16:46     TODAY-DAY OF DISCHARGE:  Subjective:   KZanobia Griebeltoday has no headache,no chest abdominal pain,no new weakness tingling or numbness, feels much better wants to go home today.   Objective:   Blood pressure (!) 142/61, pulse 70, temperature 97.7 F (36.5 C), temperature source Oral,  resp. rate 16, height 5' (1.524 m), weight 50.8 kg, SpO2 100 %. No intake or output data in the 24 hours ending 08/18/21 0852 Filed Weights   08/16/21 0735  Weight: 50.8 kg    Exam: Awake Alert, Oriented *3, No new F.N deficits, Normal affect [place].AT,PERRAL Supple Neck,No JVD, No cervical lymphadenopathy appriciated.  Symmetrical Chest wall movement, Good air movement bilaterally, CTAB RRR,No Gallops,Rubs or new Murmurs, No Parasternal Heave +ve B.Sounds, Abd Soft, Non tender, No organomegaly appriciated, No rebound -guarding or rigidity. No Cyanosis, Clubbing or edema, No new Rash or bruise   PERTINENT RADIOLOGIC STUDIES: No results found.   PERTINENT LAB RESULTS: CBC: Recent Labs    08/17/21 0206 08/18/21 0451  WBC 8.7 6.9  HGB 9.6* 8.8*  HCT 28.8* 27.7*  PLT 196 201   CMET CMP     Component Value Date/Time   NA 137 08/18/2021 0451   K 3.7 08/18/2021 0451   CL 111 08/18/2021 0451   CO2 21 (L) 08/18/2021 0451   GLUCOSE 114 (H) 08/18/2021 0451  BUN 20 08/18/2021 0451   CREATININE 1.26 (H) 08/18/2021 0451   CALCIUM 7.7 (L) 08/18/2021 0451   PROT 4.3 (L) 08/18/2021 0451   ALBUMIN 2.3 (L) 08/18/2021 0451   AST 13 (L) 08/18/2021 0451   ALT 10 08/18/2021 0451   ALKPHOS 62 08/18/2021 0451   BILITOT 0.3 08/18/2021 0451   GFRNONAA 44 (L) 08/18/2021 0451   GFRAA >60 07/13/2018 0204    GFR Estimated Creatinine Clearance: 26.9 mL/min (A) (by C-G formula based on SCr of 1.26 mg/dL (H)). No results for input(s): "LIPASE", "AMYLASE" in the last 72 hours. No results for input(s): "CKTOTAL", "CKMB", "CKMBINDEX", "TROPONINI" in the last 72 hours. Invalid input(s): "POCBNP" No results for input(s): "DDIMER" in the last 72 hours. No results for input(s): "HGBA1C" in the last 72 hours. No results for input(s): "CHOL", "HDL", "LDLCALC", "TRIG", "CHOLHDL", "LDLDIRECT" in the last 72 hours. No results for input(s): "TSH", "T4TOTAL", "T3FREE", "THYROIDAB" in the last 72  hours.  Invalid input(s): "FREET3" No results for input(s): "VITAMINB12", "FOLATE", "FERRITIN", "TIBC", "IRON", "RETICCTPCT" in the last 72 hours. Coags: No results for input(s): "INR" in the last 72 hours.  Invalid input(s): "PT" Microbiology: No results found for this or any previous visit (from the past 240 hour(s)).  FURTHER DISCHARGE INSTRUCTIONS:  Get Medicines reviewed and adjusted: Please take all your medications with you for your next visit with your Primary MD  Laboratory/radiological data: Please request your Primary MD to go over all hospital tests and procedure/radiological results at the follow up, please ask your Primary MD to get all Hospital records sent to his/her office.  In some cases, they will be blood work, cultures and biopsy results pending at the time of your discharge. Please request that your primary care M.D. goes through all the records of your hospital data and follows up on these results.  Also Note the following: If you experience worsening of your admission symptoms, develop shortness of breath, life threatening emergency, suicidal or homicidal thoughts you must seek medical attention immediately by calling 911 or calling your MD immediately  if symptoms less severe.  You must read complete instructions/literature along with all the possible adverse reactions/side effects for all the Medicines you take and that have been prescribed to you. Take any new Medicines after you have completely understood and accpet all the possible adverse reactions/side effects.   Do not drive when taking Pain medications or sleeping medications (Benzodaizepines)  Do not take more than prescribed Pain, Sleep and Anxiety Medications. It is not advisable to combine anxiety,sleep and pain medications without talking with your primary care practitioner  Special Instructions: If you have smoked or chewed Tobacco  in the last 2 yrs please stop smoking, stop any regular Alcohol   and or any Recreational drug use.  Wear Seat belts while driving.  Please note: You were cared for by a hospitalist during your hospital stay. Once you are discharged, your primary care physician will handle any further medical issues. Please note that NO REFILLS for any discharge medications will be authorized once you are discharged, as it is imperative that you return to your primary care physician (or establish a relationship with a primary care physician if you do not have one) for your post hospital discharge needs so that they can reassess your need for medications and monitor your lab values.  Total Time spent coordinating discharge including counseling, education and face to face time equals greater than 30 minutes.  SignedOren Binet 08/18/2021 8:52 AM

## 2021-08-18 NOTE — Progress Notes (Signed)
Patient discharge teaching given, including activity, diet, follow-up appoints, and medications. Patient verbalized understanding of all discharge instructions. IV access was d/c'd. Vitals are stable. Skin is intact except as charted in most recent assessments. Pt to be escorted out by NT, to be driven home by family. 

## 2021-08-25 DIAGNOSIS — I6990 Unspecified sequelae of unspecified cerebrovascular disease: Secondary | ICD-10-CM | POA: Diagnosis not present

## 2021-08-25 DIAGNOSIS — J449 Chronic obstructive pulmonary disease, unspecified: Secondary | ICD-10-CM | POA: Diagnosis not present

## 2021-08-25 DIAGNOSIS — E785 Hyperlipidemia, unspecified: Secondary | ICD-10-CM | POA: Diagnosis not present

## 2021-08-25 DIAGNOSIS — N1831 Chronic kidney disease, stage 3a: Secondary | ICD-10-CM | POA: Diagnosis not present

## 2021-08-25 DIAGNOSIS — F325 Major depressive disorder, single episode, in full remission: Secondary | ICD-10-CM | POA: Diagnosis not present

## 2021-08-25 DIAGNOSIS — N179 Acute kidney failure, unspecified: Secondary | ICD-10-CM | POA: Diagnosis not present

## 2021-08-25 DIAGNOSIS — K922 Gastrointestinal hemorrhage, unspecified: Secondary | ICD-10-CM | POA: Diagnosis not present

## 2021-08-25 DIAGNOSIS — I129 Hypertensive chronic kidney disease with stage 1 through stage 4 chronic kidney disease, or unspecified chronic kidney disease: Secondary | ICD-10-CM | POA: Diagnosis not present

## 2021-08-25 DIAGNOSIS — D509 Iron deficiency anemia, unspecified: Secondary | ICD-10-CM | POA: Diagnosis not present

## 2022-02-04 DIAGNOSIS — I6990 Unspecified sequelae of unspecified cerebrovascular disease: Secondary | ICD-10-CM | POA: Diagnosis not present

## 2022-02-04 DIAGNOSIS — I771 Stricture of artery: Secondary | ICD-10-CM | POA: Diagnosis not present

## 2022-02-04 DIAGNOSIS — N1831 Chronic kidney disease, stage 3a: Secondary | ICD-10-CM | POA: Diagnosis not present

## 2022-02-04 DIAGNOSIS — I700 Atherosclerosis of aorta: Secondary | ICD-10-CM | POA: Diagnosis not present

## 2022-02-04 DIAGNOSIS — J449 Chronic obstructive pulmonary disease, unspecified: Secondary | ICD-10-CM | POA: Diagnosis not present

## 2022-02-04 DIAGNOSIS — I129 Hypertensive chronic kidney disease with stage 1 through stage 4 chronic kidney disease, or unspecified chronic kidney disease: Secondary | ICD-10-CM | POA: Diagnosis not present

## 2022-02-04 DIAGNOSIS — E785 Hyperlipidemia, unspecified: Secondary | ICD-10-CM | POA: Diagnosis not present

## 2022-02-04 DIAGNOSIS — D692 Other nonthrombocytopenic purpura: Secondary | ICD-10-CM | POA: Diagnosis not present

## 2022-02-04 DIAGNOSIS — F325 Major depressive disorder, single episode, in full remission: Secondary | ICD-10-CM | POA: Diagnosis not present

## 2022-08-01 DIAGNOSIS — I739 Peripheral vascular disease, unspecified: Secondary | ICD-10-CM | POA: Diagnosis not present

## 2022-08-01 DIAGNOSIS — R82998 Other abnormal findings in urine: Secondary | ICD-10-CM | POA: Diagnosis not present

## 2022-08-01 DIAGNOSIS — N1831 Chronic kidney disease, stage 3a: Secondary | ICD-10-CM | POA: Diagnosis not present

## 2022-08-01 DIAGNOSIS — R739 Hyperglycemia, unspecified: Secondary | ICD-10-CM | POA: Diagnosis not present

## 2022-08-01 DIAGNOSIS — E7849 Other hyperlipidemia: Secondary | ICD-10-CM | POA: Diagnosis not present

## 2022-08-01 DIAGNOSIS — E785 Hyperlipidemia, unspecified: Secondary | ICD-10-CM | POA: Diagnosis not present

## 2022-08-01 DIAGNOSIS — Z0000 Encounter for general adult medical examination without abnormal findings: Secondary | ICD-10-CM | POA: Diagnosis not present

## 2022-08-01 DIAGNOSIS — R2689 Other abnormalities of gait and mobility: Secondary | ICD-10-CM | POA: Diagnosis not present

## 2022-08-01 DIAGNOSIS — D699 Hemorrhagic condition, unspecified: Secondary | ICD-10-CM | POA: Diagnosis not present

## 2022-08-01 DIAGNOSIS — D692 Other nonthrombocytopenic purpura: Secondary | ICD-10-CM | POA: Diagnosis not present

## 2022-08-01 DIAGNOSIS — D509 Iron deficiency anemia, unspecified: Secondary | ICD-10-CM | POA: Diagnosis not present

## 2022-08-01 DIAGNOSIS — M810 Age-related osteoporosis without current pathological fracture: Secondary | ICD-10-CM | POA: Diagnosis not present

## 2022-08-01 DIAGNOSIS — Z1212 Encounter for screening for malignant neoplasm of rectum: Secondary | ICD-10-CM | POA: Diagnosis not present

## 2022-08-01 DIAGNOSIS — I129 Hypertensive chronic kidney disease with stage 1 through stage 4 chronic kidney disease, or unspecified chronic kidney disease: Secondary | ICD-10-CM | POA: Diagnosis not present

## 2022-08-01 DIAGNOSIS — I700 Atherosclerosis of aorta: Secondary | ICD-10-CM | POA: Diagnosis not present

## 2022-08-01 DIAGNOSIS — E539 Vitamin B deficiency, unspecified: Secondary | ICD-10-CM | POA: Diagnosis not present

## 2022-08-01 DIAGNOSIS — I6990 Unspecified sequelae of unspecified cerebrovascular disease: Secondary | ICD-10-CM | POA: Diagnosis not present

## 2022-08-01 DIAGNOSIS — Z23 Encounter for immunization: Secondary | ICD-10-CM | POA: Diagnosis not present

## 2023-02-06 DIAGNOSIS — I771 Stricture of artery: Secondary | ICD-10-CM | POA: Diagnosis not present

## 2023-02-06 DIAGNOSIS — M810 Age-related osteoporosis without current pathological fracture: Secondary | ICD-10-CM | POA: Diagnosis not present

## 2023-02-06 DIAGNOSIS — G4700 Insomnia, unspecified: Secondary | ICD-10-CM | POA: Diagnosis not present

## 2023-02-06 DIAGNOSIS — F325 Major depressive disorder, single episode, in full remission: Secondary | ICD-10-CM | POA: Diagnosis not present

## 2023-02-06 DIAGNOSIS — Z8719 Personal history of other diseases of the digestive system: Secondary | ICD-10-CM | POA: Diagnosis not present

## 2023-02-06 DIAGNOSIS — I6990 Unspecified sequelae of unspecified cerebrovascular disease: Secondary | ICD-10-CM | POA: Diagnosis not present

## 2023-02-06 DIAGNOSIS — J309 Allergic rhinitis, unspecified: Secondary | ICD-10-CM | POA: Diagnosis not present

## 2023-02-06 DIAGNOSIS — E538 Deficiency of other specified B group vitamins: Secondary | ICD-10-CM | POA: Diagnosis not present

## 2023-02-06 DIAGNOSIS — E46 Unspecified protein-calorie malnutrition: Secondary | ICD-10-CM | POA: Diagnosis not present

## 2023-02-06 DIAGNOSIS — C029 Malignant neoplasm of tongue, unspecified: Secondary | ICD-10-CM | POA: Diagnosis not present

## 2023-02-06 DIAGNOSIS — J439 Emphysema, unspecified: Secondary | ICD-10-CM | POA: Diagnosis not present

## 2023-02-06 DIAGNOSIS — J449 Chronic obstructive pulmonary disease, unspecified: Secondary | ICD-10-CM | POA: Diagnosis not present

## 2023-02-06 DIAGNOSIS — I739 Peripheral vascular disease, unspecified: Secondary | ICD-10-CM | POA: Diagnosis not present

## 2023-02-06 DIAGNOSIS — F17200 Nicotine dependence, unspecified, uncomplicated: Secondary | ICD-10-CM | POA: Diagnosis not present

## 2023-02-06 DIAGNOSIS — M545 Low back pain, unspecified: Secondary | ICD-10-CM | POA: Diagnosis not present

## 2023-02-06 DIAGNOSIS — E559 Vitamin D deficiency, unspecified: Secondary | ICD-10-CM | POA: Diagnosis not present

## 2023-02-06 DIAGNOSIS — D692 Other nonthrombocytopenic purpura: Secondary | ICD-10-CM | POA: Diagnosis not present

## 2023-08-14 DIAGNOSIS — R82998 Other abnormal findings in urine: Secondary | ICD-10-CM | POA: Diagnosis not present

## 2023-08-14 DIAGNOSIS — E785 Hyperlipidemia, unspecified: Secondary | ICD-10-CM | POA: Diagnosis not present

## 2023-08-14 DIAGNOSIS — E538 Deficiency of other specified B group vitamins: Secondary | ICD-10-CM | POA: Diagnosis not present

## 2023-08-14 DIAGNOSIS — J449 Chronic obstructive pulmonary disease, unspecified: Secondary | ICD-10-CM | POA: Diagnosis not present

## 2023-08-14 DIAGNOSIS — Z0000 Encounter for general adult medical examination without abnormal findings: Secondary | ICD-10-CM | POA: Diagnosis not present

## 2023-08-14 DIAGNOSIS — I129 Hypertensive chronic kidney disease with stage 1 through stage 4 chronic kidney disease, or unspecified chronic kidney disease: Secondary | ICD-10-CM | POA: Diagnosis not present

## 2023-08-14 DIAGNOSIS — Z1212 Encounter for screening for malignant neoplasm of rectum: Secondary | ICD-10-CM | POA: Diagnosis not present

## 2023-08-14 DIAGNOSIS — N1831 Chronic kidney disease, stage 3a: Secondary | ICD-10-CM | POA: Diagnosis not present

## 2023-08-14 DIAGNOSIS — Z1331 Encounter for screening for depression: Secondary | ICD-10-CM | POA: Diagnosis not present

## 2023-08-14 DIAGNOSIS — I771 Stricture of artery: Secondary | ICD-10-CM | POA: Diagnosis not present

## 2023-08-14 DIAGNOSIS — I739 Peripheral vascular disease, unspecified: Secondary | ICD-10-CM | POA: Diagnosis not present

## 2023-08-14 DIAGNOSIS — D509 Iron deficiency anemia, unspecified: Secondary | ICD-10-CM | POA: Diagnosis not present

## 2023-08-14 DIAGNOSIS — Z1389 Encounter for screening for other disorder: Secondary | ICD-10-CM | POA: Diagnosis not present

## 2023-08-14 DIAGNOSIS — I6990 Unspecified sequelae of unspecified cerebrovascular disease: Secondary | ICD-10-CM | POA: Diagnosis not present

## 2023-08-14 DIAGNOSIS — F325 Major depressive disorder, single episode, in full remission: Secondary | ICD-10-CM | POA: Diagnosis not present

## 2023-08-14 DIAGNOSIS — M810 Age-related osteoporosis without current pathological fracture: Secondary | ICD-10-CM | POA: Diagnosis not present

## 2023-09-13 DIAGNOSIS — H04123 Dry eye syndrome of bilateral lacrimal glands: Secondary | ICD-10-CM | POA: Diagnosis not present

## 2023-09-13 DIAGNOSIS — H40013 Open angle with borderline findings, low risk, bilateral: Secondary | ICD-10-CM | POA: Diagnosis not present

## 2023-10-18 ENCOUNTER — Emergency Department (HOSPITAL_COMMUNITY)

## 2023-10-18 ENCOUNTER — Inpatient Hospital Stay (HOSPITAL_COMMUNITY): Admission: EM | Admit: 2023-10-18 | Discharge: 2023-11-18 | DRG: 870 | Disposition: E | Source: Skilled Nursing Facility

## 2023-10-18 ENCOUNTER — Encounter (HOSPITAL_COMMUNITY): Payer: Self-pay

## 2023-10-18 ENCOUNTER — Other Ambulatory Visit: Payer: Self-pay

## 2023-10-18 ENCOUNTER — Inpatient Hospital Stay (HOSPITAL_COMMUNITY)

## 2023-10-18 DIAGNOSIS — R0689 Other abnormalities of breathing: Secondary | ICD-10-CM | POA: Diagnosis not present

## 2023-10-18 DIAGNOSIS — R6520 Severe sepsis without septic shock: Secondary | ICD-10-CM | POA: Diagnosis present

## 2023-10-18 DIAGNOSIS — J90 Pleural effusion, not elsewhere classified: Secondary | ICD-10-CM | POA: Diagnosis not present

## 2023-10-18 DIAGNOSIS — Z8673 Personal history of transient ischemic attack (TIA), and cerebral infarction without residual deficits: Secondary | ICD-10-CM | POA: Diagnosis not present

## 2023-10-18 DIAGNOSIS — R918 Other nonspecific abnormal finding of lung field: Secondary | ICD-10-CM | POA: Diagnosis not present

## 2023-10-18 DIAGNOSIS — N1832 Chronic kidney disease, stage 3b: Secondary | ICD-10-CM | POA: Diagnosis present

## 2023-10-18 DIAGNOSIS — Z8589 Personal history of malignant neoplasm of other organs and systems: Secondary | ICD-10-CM | POA: Diagnosis not present

## 2023-10-18 DIAGNOSIS — R197 Diarrhea, unspecified: Secondary | ICD-10-CM | POA: Diagnosis not present

## 2023-10-18 DIAGNOSIS — D72829 Elevated white blood cell count, unspecified: Secondary | ICD-10-CM | POA: Diagnosis not present

## 2023-10-18 DIAGNOSIS — N170 Acute kidney failure with tubular necrosis: Secondary | ICD-10-CM | POA: Diagnosis not present

## 2023-10-18 DIAGNOSIS — E875 Hyperkalemia: Secondary | ICD-10-CM | POA: Diagnosis not present

## 2023-10-18 DIAGNOSIS — N281 Cyst of kidney, acquired: Secondary | ICD-10-CM | POA: Diagnosis present

## 2023-10-18 DIAGNOSIS — E8729 Other acidosis: Secondary | ICD-10-CM | POA: Diagnosis present

## 2023-10-18 DIAGNOSIS — E785 Hyperlipidemia, unspecified: Secondary | ICD-10-CM | POA: Diagnosis present

## 2023-10-18 DIAGNOSIS — Z85810 Personal history of malignant neoplasm of tongue: Secondary | ICD-10-CM

## 2023-10-18 DIAGNOSIS — I517 Cardiomegaly: Secondary | ICD-10-CM | POA: Diagnosis not present

## 2023-10-18 DIAGNOSIS — I4891 Unspecified atrial fibrillation: Secondary | ICD-10-CM | POA: Diagnosis not present

## 2023-10-18 DIAGNOSIS — E43 Unspecified severe protein-calorie malnutrition: Secondary | ICD-10-CM | POA: Diagnosis not present

## 2023-10-18 DIAGNOSIS — Z515 Encounter for palliative care: Secondary | ICD-10-CM

## 2023-10-18 DIAGNOSIS — Z1152 Encounter for screening for COVID-19: Secondary | ICD-10-CM

## 2023-10-18 DIAGNOSIS — Z823 Family history of stroke: Secondary | ICD-10-CM

## 2023-10-18 DIAGNOSIS — Z885 Allergy status to narcotic agent status: Secondary | ICD-10-CM

## 2023-10-18 DIAGNOSIS — A419 Sepsis, unspecified organism: Secondary | ICD-10-CM | POA: Diagnosis not present

## 2023-10-18 DIAGNOSIS — R34 Anuria and oliguria: Secondary | ICD-10-CM | POA: Diagnosis present

## 2023-10-18 DIAGNOSIS — K219 Gastro-esophageal reflux disease without esophagitis: Secondary | ICD-10-CM | POA: Diagnosis present

## 2023-10-18 DIAGNOSIS — R451 Restlessness and agitation: Secondary | ICD-10-CM | POA: Diagnosis not present

## 2023-10-18 DIAGNOSIS — J189 Pneumonia, unspecified organism: Secondary | ICD-10-CM | POA: Diagnosis not present

## 2023-10-18 DIAGNOSIS — Z87891 Personal history of nicotine dependence: Secondary | ICD-10-CM | POA: Diagnosis not present

## 2023-10-18 DIAGNOSIS — Z79899 Other long term (current) drug therapy: Secondary | ICD-10-CM

## 2023-10-18 DIAGNOSIS — J440 Chronic obstructive pulmonary disease with acute lower respiratory infection: Secondary | ICD-10-CM | POA: Diagnosis present

## 2023-10-18 DIAGNOSIS — N289 Disorder of kidney and ureter, unspecified: Secondary | ICD-10-CM | POA: Diagnosis not present

## 2023-10-18 DIAGNOSIS — K921 Melena: Secondary | ICD-10-CM | POA: Diagnosis not present

## 2023-10-18 DIAGNOSIS — D509 Iron deficiency anemia, unspecified: Secondary | ICD-10-CM | POA: Diagnosis present

## 2023-10-18 DIAGNOSIS — I739 Peripheral vascular disease, unspecified: Secondary | ICD-10-CM | POA: Diagnosis present

## 2023-10-18 DIAGNOSIS — I129 Hypertensive chronic kidney disease with stage 1 through stage 4 chronic kidney disease, or unspecified chronic kidney disease: Secondary | ICD-10-CM | POA: Diagnosis not present

## 2023-10-18 DIAGNOSIS — B9561 Methicillin susceptible Staphylococcus aureus infection as the cause of diseases classified elsewhere: Secondary | ICD-10-CM | POA: Diagnosis present

## 2023-10-18 DIAGNOSIS — N3289 Other specified disorders of bladder: Secondary | ICD-10-CM | POA: Diagnosis not present

## 2023-10-18 DIAGNOSIS — J849 Interstitial pulmonary disease, unspecified: Secondary | ICD-10-CM | POA: Diagnosis not present

## 2023-10-18 DIAGNOSIS — R6510 Systemic inflammatory response syndrome (SIRS) of non-infectious origin without acute organ dysfunction: Secondary | ICD-10-CM | POA: Diagnosis present

## 2023-10-18 DIAGNOSIS — I10 Essential (primary) hypertension: Secondary | ICD-10-CM | POA: Diagnosis not present

## 2023-10-18 DIAGNOSIS — R0602 Shortness of breath: Secondary | ICD-10-CM | POA: Diagnosis not present

## 2023-10-18 DIAGNOSIS — Z66 Do not resuscitate: Secondary | ICD-10-CM | POA: Diagnosis not present

## 2023-10-18 DIAGNOSIS — E876 Hypokalemia: Secondary | ICD-10-CM | POA: Diagnosis not present

## 2023-10-18 DIAGNOSIS — C029 Malignant neoplasm of tongue, unspecified: Secondary | ICD-10-CM | POA: Diagnosis not present

## 2023-10-18 DIAGNOSIS — Z8249 Family history of ischemic heart disease and other diseases of the circulatory system: Secondary | ICD-10-CM

## 2023-10-18 DIAGNOSIS — C3491 Malignant neoplasm of unspecified part of right bronchus or lung: Secondary | ICD-10-CM | POA: Diagnosis present

## 2023-10-18 DIAGNOSIS — J811 Chronic pulmonary edema: Secondary | ICD-10-CM | POA: Diagnosis not present

## 2023-10-18 DIAGNOSIS — Z6828 Body mass index (BMI) 28.0-28.9, adult: Secondary | ICD-10-CM

## 2023-10-18 DIAGNOSIS — R059 Cough, unspecified: Secondary | ICD-10-CM | POA: Diagnosis not present

## 2023-10-18 DIAGNOSIS — J441 Chronic obstructive pulmonary disease with (acute) exacerbation: Secondary | ICD-10-CM | POA: Diagnosis present

## 2023-10-18 DIAGNOSIS — R54 Age-related physical debility: Secondary | ICD-10-CM | POA: Diagnosis present

## 2023-10-18 DIAGNOSIS — F419 Anxiety disorder, unspecified: Secondary | ICD-10-CM | POA: Diagnosis present

## 2023-10-18 DIAGNOSIS — F32A Depression, unspecified: Secondary | ICD-10-CM | POA: Diagnosis present

## 2023-10-18 DIAGNOSIS — N179 Acute kidney failure, unspecified: Secondary | ICD-10-CM | POA: Diagnosis not present

## 2023-10-18 DIAGNOSIS — J9811 Atelectasis: Secondary | ICD-10-CM | POA: Diagnosis not present

## 2023-10-18 DIAGNOSIS — J9601 Acute respiratory failure with hypoxia: Principal | ICD-10-CM | POA: Diagnosis present

## 2023-10-18 DIAGNOSIS — R509 Fever, unspecified: Secondary | ICD-10-CM | POA: Diagnosis not present

## 2023-10-18 DIAGNOSIS — J439 Emphysema, unspecified: Secondary | ICD-10-CM | POA: Diagnosis present

## 2023-10-18 DIAGNOSIS — J851 Abscess of lung with pneumonia: Secondary | ICD-10-CM | POA: Diagnosis not present

## 2023-10-18 DIAGNOSIS — N1831 Chronic kidney disease, stage 3a: Secondary | ICD-10-CM | POA: Diagnosis not present

## 2023-10-18 DIAGNOSIS — Z7902 Long term (current) use of antithrombotics/antiplatelets: Secondary | ICD-10-CM

## 2023-10-18 DIAGNOSIS — K828 Other specified diseases of gallbladder: Secondary | ICD-10-CM | POA: Diagnosis not present

## 2023-10-18 DIAGNOSIS — I700 Atherosclerosis of aorta: Secondary | ICD-10-CM | POA: Diagnosis not present

## 2023-10-18 DIAGNOSIS — Z7189 Other specified counseling: Secondary | ICD-10-CM | POA: Diagnosis not present

## 2023-10-18 DIAGNOSIS — N2889 Other specified disorders of kidney and ureter: Secondary | ICD-10-CM | POA: Diagnosis present

## 2023-10-18 DIAGNOSIS — K5730 Diverticulosis of large intestine without perforation or abscess without bleeding: Secondary | ICD-10-CM | POA: Diagnosis not present

## 2023-10-18 DIAGNOSIS — N183 Chronic kidney disease, stage 3 unspecified: Secondary | ICD-10-CM | POA: Diagnosis present

## 2023-10-18 DIAGNOSIS — R0902 Hypoxemia: Secondary | ICD-10-CM | POA: Diagnosis not present

## 2023-10-18 LAB — I-STAT CG4 LACTIC ACID, ED: Lactic Acid, Venous: 1.3 mmol/L (ref 0.5–1.9)

## 2023-10-18 LAB — RESP PANEL BY RT-PCR (RSV, FLU A&B, COVID)  RVPGX2
Influenza A by PCR: NEGATIVE
Influenza B by PCR: NEGATIVE
Resp Syncytial Virus by PCR: NEGATIVE
SARS Coronavirus 2 by RT PCR: NEGATIVE

## 2023-10-18 LAB — I-STAT VENOUS BLOOD GAS, ED
Acid-base deficit: 5 mmol/L — ABNORMAL HIGH (ref 0.0–2.0)
Bicarbonate: 18.6 mmol/L — ABNORMAL LOW (ref 20.0–28.0)
Calcium, Ion: 1.03 mmol/L — ABNORMAL LOW (ref 1.15–1.40)
HCT: 30 % — ABNORMAL LOW (ref 36.0–46.0)
Hemoglobin: 10.2 g/dL — ABNORMAL LOW (ref 12.0–15.0)
O2 Saturation: 99 %
Potassium: 3 mmol/L — ABNORMAL LOW (ref 3.5–5.1)
Sodium: 137 mmol/L (ref 135–145)
TCO2: 19 mmol/L — ABNORMAL LOW (ref 22–32)
pCO2, Ven: 29 mmHg — ABNORMAL LOW (ref 44–60)
pH, Ven: 7.414 (ref 7.25–7.43)
pO2, Ven: 153 mmHg — ABNORMAL HIGH (ref 32–45)

## 2023-10-18 LAB — CBC WITH DIFFERENTIAL/PLATELET
Abs Immature Granulocytes: 0.25 K/uL — ABNORMAL HIGH (ref 0.00–0.07)
Basophils Absolute: 0.1 K/uL (ref 0.0–0.1)
Basophils Relative: 0 %
Eosinophils Absolute: 0 K/uL (ref 0.0–0.5)
Eosinophils Relative: 0 %
HCT: 30.1 % — ABNORMAL LOW (ref 36.0–46.0)
Hemoglobin: 9.4 g/dL — ABNORMAL LOW (ref 12.0–15.0)
Immature Granulocytes: 1 %
Lymphocytes Relative: 8 %
Lymphs Abs: 1.4 K/uL (ref 0.7–4.0)
MCH: 24.7 pg — ABNORMAL LOW (ref 26.0–34.0)
MCHC: 31.2 g/dL (ref 30.0–36.0)
MCV: 79 fL — ABNORMAL LOW (ref 80.0–100.0)
Monocytes Absolute: 2.4 K/uL — ABNORMAL HIGH (ref 0.1–1.0)
Monocytes Relative: 13 %
Neutro Abs: 14.1 K/uL — ABNORMAL HIGH (ref 1.7–7.7)
Neutrophils Relative %: 78 %
Platelets: 473 K/uL — ABNORMAL HIGH (ref 150–400)
RBC: 3.81 MIL/uL — ABNORMAL LOW (ref 3.87–5.11)
RDW: 15.7 % — ABNORMAL HIGH (ref 11.5–15.5)
WBC: 18.2 K/uL — ABNORMAL HIGH (ref 4.0–10.5)
nRBC: 0 % (ref 0.0–0.2)

## 2023-10-18 LAB — URINALYSIS, W/ REFLEX TO CULTURE (INFECTION SUSPECTED)
Bilirubin Urine: NEGATIVE
Glucose, UA: NEGATIVE mg/dL
Ketones, ur: 20 mg/dL — AB
Leukocytes,Ua: NEGATIVE
Nitrite: NEGATIVE
Protein, ur: 100 mg/dL — AB
Specific Gravity, Urine: 1.016 (ref 1.005–1.030)
pH: 5 (ref 5.0–8.0)

## 2023-10-18 LAB — COMPREHENSIVE METABOLIC PANEL WITH GFR
ALT: 13 U/L (ref 0–44)
AST: 26 U/L (ref 15–41)
Albumin: 2.1 g/dL — ABNORMAL LOW (ref 3.5–5.0)
Alkaline Phosphatase: 107 U/L (ref 38–126)
Anion gap: 19 — ABNORMAL HIGH (ref 5–15)
BUN: 39 mg/dL — ABNORMAL HIGH (ref 8–23)
CO2: 16 mmol/L — ABNORMAL LOW (ref 22–32)
Calcium: 7.8 mg/dL — ABNORMAL LOW (ref 8.9–10.3)
Chloride: 101 mmol/L (ref 98–111)
Creatinine, Ser: 1.48 mg/dL — ABNORMAL HIGH (ref 0.44–1.00)
GFR, Estimated: 36 mL/min — ABNORMAL LOW (ref >60)
Glucose, Bld: 125 mg/dL — ABNORMAL HIGH (ref 70–99)
Potassium: 3.3 mmol/L — ABNORMAL LOW (ref 3.5–5.1)
Sodium: 136 mmol/L (ref 135–145)
Total Bilirubin: 1.3 mg/dL — ABNORMAL HIGH (ref 0.0–1.2)
Total Protein: 6.5 g/dL (ref 6.5–8.1)

## 2023-10-18 LAB — TSH: TSH: 0.272 u[IU]/mL — ABNORMAL LOW (ref 0.350–4.500)

## 2023-10-18 LAB — PROCALCITONIN: Procalcitonin: 2.42 ng/mL

## 2023-10-18 LAB — TROPONIN I (HIGH SENSITIVITY): Troponin I (High Sensitivity): 43 ng/L — ABNORMAL HIGH (ref <18)

## 2023-10-18 LAB — SEDIMENTATION RATE: Sed Rate: 120 mm/h — ABNORMAL HIGH (ref 0–22)

## 2023-10-18 LAB — C-REACTIVE PROTEIN: CRP: 44.3 mg/dL — ABNORMAL HIGH (ref <1.0)

## 2023-10-18 MED ORDER — ASPIRIN 81 MG PO TBEC
81.0000 mg | DELAYED_RELEASE_TABLET | Freq: Every day | ORAL | Status: DC
  Administered 2023-10-19: 81 mg via ORAL
  Filled 2023-10-18: qty 1

## 2023-10-18 MED ORDER — PIPERACILLIN-TAZOBACTAM 3.375 G IVPB 30 MIN
3.3750 g | Freq: Once | INTRAVENOUS | Status: AC
  Administered 2023-10-18: 3.375 g via INTRAVENOUS
  Filled 2023-10-18: qty 50

## 2023-10-18 MED ORDER — AMLODIPINE BESYLATE 10 MG PO TABS: 10.0000 mg | ORAL_TABLET | Freq: Every day | ORAL | Status: DC

## 2023-10-18 MED ORDER — ALBUTEROL SULFATE (2.5 MG/3ML) 0.083% IN NEBU
2.5000 mg | INHALATION_SOLUTION | RESPIRATORY_TRACT | Status: DC | PRN
  Administered 2023-10-18 – 2023-10-19 (×3): 2.5 mg via RESPIRATORY_TRACT
  Filled 2023-10-18 (×3): qty 3

## 2023-10-18 MED ORDER — SODIUM CHLORIDE 3 % IN NEBU
4.0000 mL | INHALATION_SOLUTION | Freq: Once | RESPIRATORY_TRACT | Status: AC
  Administered 2023-10-18: 4 mL via RESPIRATORY_TRACT
  Filled 2023-10-18: qty 4

## 2023-10-18 MED ORDER — ONDANSETRON HCL 4 MG PO TABS: 4.0000 mg | ORAL_TABLET | Freq: Four times a day (QID) | ORAL | Status: DC | PRN

## 2023-10-18 MED ORDER — ACETAMINOPHEN 325 MG PO TABS: 650.0000 mg | ORAL_TABLET | Freq: Four times a day (QID) | ORAL | Status: DC | PRN

## 2023-10-18 MED ORDER — ONDANSETRON HCL 4 MG/2ML IJ SOLN: 4.0000 mg | Freq: Four times a day (QID) | INTRAMUSCULAR | Status: DC | PRN

## 2023-10-18 MED ORDER — METOPROLOL TARTRATE 5 MG/5ML IV SOLN
5.0000 mg | Freq: Once | INTRAVENOUS | Status: AC
  Administered 2023-10-19: 5 mg via INTRAVENOUS
  Filled 2023-10-18: qty 5

## 2023-10-18 MED ORDER — HEPARIN (PORCINE) 25000 UT/250ML-% IV SOLN
950.0000 [IU]/h | INTRAVENOUS | Status: DC
  Administered 2023-10-18 – 2023-10-20 (×2): 700 [IU]/h via INTRAVENOUS
  Administered 2023-10-21 – 2023-10-23 (×2): 850 [IU]/h via INTRAVENOUS
  Administered 2023-10-24: 950 [IU]/h via INTRAVENOUS
  Filled 2023-10-18 (×6): qty 250

## 2023-10-18 MED ORDER — HEPARIN BOLUS VIA INFUSION
2500.0000 [IU] | Freq: Once | INTRAVENOUS | Status: AC
  Administered 2023-10-18: 2500 [IU] via INTRAVENOUS
  Filled 2023-10-18: qty 2500

## 2023-10-18 MED ORDER — METHYLPREDNISOLONE SODIUM SUCC 125 MG IJ SOLR: 125.0000 mg | Freq: Once | INTRAMUSCULAR | Status: DC

## 2023-10-18 MED ORDER — SODIUM CHLORIDE 0.9% FLUSH
3.0000 mL | Freq: Two times a day (BID) | INTRAVENOUS | Status: DC
  Administered 2023-10-18 – 2023-10-26 (×14): 3 mL via INTRAVENOUS

## 2023-10-18 MED ORDER — ENOXAPARIN SODIUM 30 MG/0.3ML IJ SOSY
30.0000 mg | PREFILLED_SYRINGE | INTRAMUSCULAR | Status: DC
  Administered 2023-10-18: 30 mg via SUBCUTANEOUS
  Filled 2023-10-18: qty 0.3

## 2023-10-18 MED ORDER — LACTATED RINGERS IV BOLUS
2000.0000 mL | Freq: Once | INTRAVENOUS | Status: AC
  Administered 2023-10-18: 1000 mL via INTRAVENOUS

## 2023-10-18 MED ORDER — GUAIFENESIN ER 600 MG PO TB12
600.0000 mg | ORAL_TABLET | Freq: Two times a day (BID) | ORAL | Status: DC
  Administered 2023-10-18 – 2023-10-19 (×3): 600 mg via ORAL
  Filled 2023-10-18 (×3): qty 1

## 2023-10-18 MED ORDER — PIPERACILLIN-TAZOBACTAM 3.375 G IVPB
3.3750 g | Freq: Three times a day (TID) | INTRAVENOUS | Status: DC
  Administered 2023-10-18 – 2023-10-21 (×8): 3.375 g via INTRAVENOUS
  Filled 2023-10-18 (×8): qty 50

## 2023-10-18 MED ORDER — IOHEXOL 350 MG/ML SOLN
60.0000 mL | Freq: Once | INTRAVENOUS | Status: AC | PRN
  Administered 2023-10-18: 60 mL via INTRAVENOUS

## 2023-10-18 MED ORDER — PANTOPRAZOLE SODIUM 40 MG PO TBEC
40.0000 mg | DELAYED_RELEASE_TABLET | Freq: Every day | ORAL | Status: DC
Start: 2023-10-19 — End: 2023-10-19
  Administered 2023-10-19: 40 mg via ORAL
  Filled 2023-10-18: qty 1

## 2023-10-18 MED ORDER — VANCOMYCIN HCL IN DEXTROSE 1-5 GM/200ML-% IV SOLN
1000.0000 mg | INTRAVENOUS | Status: DC
  Administered 2023-10-20: 1000 mg via INTRAVENOUS
  Filled 2023-10-18: qty 200

## 2023-10-18 MED ORDER — ARFORMOTEROL TARTRATE 15 MCG/2ML IN NEBU
15.0000 ug | INHALATION_SOLUTION | Freq: Two times a day (BID) | RESPIRATORY_TRACT | Status: DC
  Administered 2023-10-18 – 2023-10-20 (×4): 15 ug via RESPIRATORY_TRACT
  Filled 2023-10-18 (×4): qty 2

## 2023-10-18 MED ORDER — ATORVASTATIN CALCIUM 10 MG PO TABS
10.0000 mg | ORAL_TABLET | Freq: Every day | ORAL | Status: DC
  Administered 2023-10-19: 10 mg via ORAL
  Filled 2023-10-18: qty 1

## 2023-10-18 MED ORDER — IPRATROPIUM-ALBUTEROL 0.5-2.5 (3) MG/3ML IN SOLN
3.0000 mL | Freq: Once | RESPIRATORY_TRACT | Status: AC
  Administered 2023-10-18: 3 mL via RESPIRATORY_TRACT
  Filled 2023-10-18: qty 3

## 2023-10-18 MED ORDER — LOSARTAN POTASSIUM 50 MG PO TABS: 25.0000 mg | ORAL_TABLET | Freq: Every day | ORAL | Status: DC

## 2023-10-18 MED ORDER — ACETAMINOPHEN 650 MG RE SUPP: 650.0000 mg | Freq: Four times a day (QID) | RECTAL | Status: DC | PRN

## 2023-10-18 MED ORDER — ESCITALOPRAM OXALATE 10 MG PO TABS
20.0000 mg | ORAL_TABLET | Freq: Every day | ORAL | Status: DC
  Administered 2023-10-19: 20 mg via ORAL
  Filled 2023-10-18: qty 1

## 2023-10-18 MED ORDER — VANCOMYCIN HCL 1250 MG/250ML IV SOLN
1250.0000 mg | Freq: Once | INTRAVENOUS | Status: AC
  Administered 2023-10-18: 1250 mg via INTRAVENOUS
  Filled 2023-10-18: qty 250

## 2023-10-18 NOTE — Progress Notes (Signed)
 PHARMACY - ANTICOAGULATION CONSULT NOTE  Pharmacy Consult for Heparin infusion Indication: atrial fibrillation  Allergies  Allergen Reactions   Codeine Itching and Other (See Comments)    Caused mouth to break out in hives   Neosporin [Neomycin-Bacitracin Zn-Polymyx] Itching and Swelling    Swelling at application site   Percocet [Oxycodone -Acetaminophen ] Other (See Comments)    B-blockers, triggers depression   Tussionex Pennkinetic Er [Hydrocod Poli-Chlorphe Poli Er] Itching    Patient Measurements: Height: 5' (152.4 cm) Weight: 56.7 kg (125 lb) IBW/kg (Calculated) : 45.5 HEPARIN DW (KG): 56.7  Vital Signs: Temp: 97.2 F (36.2 C) (10/01 1932) Temp Source: Axillary (10/01 1932) BP: 139/72 (10/01 2120) Pulse Rate: 147 (10/01 2120)  Labs: Recent Labs    10/18/23 0937 10/18/23 0959  HGB 9.4* 10.2*  HCT 30.1* 30.0*  PLT 473*  --   CREATININE 1.48*  --     Estimated Creatinine Clearance: 24.3 mL/min (A) (by C-G formula based on SCr of 1.48 mg/dL (H)).   Medical History: Past Medical History:  Diagnosis Date   Asthma    Cancer (HCC)    Depression    Hypertension    Peripheral vascular disease    Pneumonia    Sleep apnea    Stroke (HCC)     Medications:  Medications Prior to Admission  Medication Sig Dispense Refill Last Dose/Taking   amLODipine  (NORVASC ) 10 MG tablet Take 1 tablet (10 mg total) by mouth daily.   10/17/2023   atorvastatin  (LIPITOR) 10 MG tablet Take 10 mg by mouth daily.   3 10/17/2023   clopidogrel  (PLAVIX ) 75 MG tablet Take 1 tablet (75 mg total) by mouth daily. 30 tablet 1 10/17/2023   escitalopram  (LEXAPRO ) 20 MG tablet Take 20 mg by mouth daily.   5 10/17/2023   ferrous sulfate  325 (65 FE) MG tablet Take 1 tablet (325 mg total) by mouth daily with breakfast. (Patient taking differently: Take 325 mg by mouth every other day. Monday, Wednesday, Friday) 90 tablet 1 10/17/2023   losartan  (COZAAR ) 25 MG tablet Take 25 mg by mouth daily.    10/17/2023   pantoprazole  (PROTONIX ) 40 MG tablet Take 1 tablet (40 mg total) by mouth daily. 90 tablet 1 10/17/2023   vitamin E 180 MG (400 UNITS) capsule Take 400 Units by mouth daily.   10/17/2023    Assessment: 78 YOF. New onset Afib. Not on anticoagulation at home. Goal of Therapy:  Heparin level 0.3-0.7 units/ml Monitor platelets by anticoagulation protocol: Yes   Plan:  Give 2500 units bolus x 1 Start heparin infusion at 700 units/hr Check anti-Xa level in 8 hours and daily while on heparin Continue to monitor H&H and platelets  Larraine Brazier, PharmD Clinical Pharmacist 10/18/2023  10:02 PM **Pharmacist phone directory can now be found on [email or web address] ([email or web address]).  Listed under [facility].

## 2023-10-18 NOTE — ED Notes (Signed)
 Pt to CT

## 2023-10-18 NOTE — Progress Notes (Signed)
 RT called by RN for a rapid response.  Patient new to floor from ED.  Patient currently on 10L salter satting 91%.  RR mid 20's.  BS rhonchus.  Per RN CXR has been ordered.  RT will continue to monitor.

## 2023-10-18 NOTE — Consult Note (Signed)
 NAME:  [name], MRN:  [id number], DOB:  Feb 28, 1945, LOS: 0 ADMISSION DATE:  10/18/2023, CONSULTATION DATE:  10/18/23 REFERRING MD:  Dr. 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  Presented from independent living facility after found to have noisy breathing and several days of progressive SOB found to be hypoxic in the 80's.  Pt states has been SOB for 3 days with new dry cough and poor PO intake related to dyspnea.  Denies fever, chills, chest pain, wt loss (although appears to have 19lb wt loss since 01/2023), hemoptysis, dysphagia or choking episodes. Does not use oxygen, nebs/ inhalers at baseline and denies OSA.   Treated in ER with O2, nebs, solumedrol.  Afebrile in ER, requiring 6L.  WBC 18.2, Hgb 9.4, sCr 1.48, ABG 7.414/ 29/ 153/ 18.6.  SARS/ flu/ RSV neg. CXR concerning for RLL caviatry lesion.  Empiric vancomycin  and zosyn started. CTA PE obtained, negative for PE, but extensive abnormality of RLL, RML and basilar aspect of RUE with mass like consolidation in RLL with multiple airway occlusions including endobronchial tumor/ debris and large 8.5 cm cavitary lesion of RLL with communication directly into lower lobe bronchi concerning for extensive pulmonary malignancy, pneumonia remains in differential.  Abnormal bronchovascular distribution within lingula extending into subpleural surface.  Also noted right renal abnormal contour suspicious for solid renal mass. Admitted to [facility] with Pulmonary consulting for further recomendations.   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 rate 17, height 5' (1.524 m), weight 56.7 kg, SpO2 93%.        Intake/Output Summary (Last 24 hours) at 10/18/2023 1351 Last data filed at 10/18/2023 1203 Gross per 24 hour  Intake 2042.91 ml  Output --  Net 2042.91 ml   Filed Weights   10/18/23 0942  Weight: 56.7 kg   Examination: General:  AoC ill appearing elderly female sitting in ER stretcher HEENT: MM pink/dry, slight dysarthric  Neuro: Alert, oriented, MAE CV: rr, NSR  PULM:  tachypneic, some conversational dyspnea, diffuse rales/ rhonchi, no wheeze, audible rhonchi Extremities: warm/dry, no LE edema, poor muscle mass  Skin: no rashes  Resolved problem list   Assessment and Plan   Abnormal lung imaging with large RLL cavitary lesion, former smoker and prior tongue SCC.  ddx concerning for malignant process vs infectious process, ?aspiration.  CT also concerning for right renal mass Hypoxic respiratory failure  GERD Leukocytosis  Hx emphysema  P:  - cont to wean supplemental O2 for sat goal > 92%, consider HHFNC if worsening WOB - cont with empiric broad spectrum abx, zosyn/ vanc.  Check MRSA PCR, if neg can d/c vanc.  Slight leukocytosis but afebrile - cough non productive currently- try hypertonic neb to induce sputum, guaifenesin, flutter send for culture - albuterol , brovana, hold LAMA  for now for sputum production - hold steroids, no wheezing - check PCT, fungitell - SLP consult  - cont PPI/ aspiration precautions - PPI  - ideally would prefer outpt bronch/ EBUS after prolonged abx course.  To be considered for inpt bronch, would need better O2 requirement and 5 day plavix   washout prior to considering bronchoscopy with EBUS.  Will hold plavix  in the event we do proceed with inpt bronch, start daily ASA given hx of CVA - pulmonary will see again 10/2, remainder per [facility]   Labs   CBC: Recent Labs  Lab 10/18/23 0937 10/18/23 0959  WBC 18.2*  --   NEUTROABS 14.1*  --   HGB 9.4* 10.2*  HCT 30.1* 30.0*  MCV 79.0*  --   PLT 473*  --     Basic Metabolic Panel: Recent Labs  Lab 10/18/23 0937 10/18/23 0959  NA 136 137  K 3.3* 3.0*  CL 101  --   CO2 16*  --   GLUCOSE 125*  --   BUN 39*  --   CREATININE 1.48*  --   CALCIUM  7.8*  --    GFR: Estimated Creatinine Clearance: 24.3 mL/min (A) (by C-G formula based on SCr of 1.48 mg/dL (H)). Recent Labs  Lab 10/18/23 0937 10/18/23 0959  WBC 18.2*  --   LATICACIDVEN  --  1.3    Liver Function Tests: Recent Labs  Lab 10/18/23 0937  AST 26  ALT 13  ALKPHOS 107  BILITOT 1.3*  PROT 6.5  ALBUMIN 2.1*   No results for input(s): LIPASE, AMYLASE in the last 168 hours. No results for input(s): AMMONIA in the last 168 hours.  ABG    Component Value Date/Time   HCO3 18.6 (L) 10/18/2023 0959   TCO2 19 (L) 10/18/2023 0959   ACIDBASEDEF 5.0 (H) 10/18/2023 0959   O2SAT 99 10/18/2023 0959     Coagulation Profile: No results for input(s): INR, PROTIME in the last 168 hours.  Cardiac Enzymes: No results for input(s): CKTOTAL, CKMB, CKMBINDEX, TROPONINI in the last 168 hours.  HbA1C: Hgb A1c MFr Bld  Date/Time Value Ref Range Status  05/04/2018 09:26 PM 6.0 (H) 4.8 - 5.6 % Final    Comment:    (NOTE) Pre diabetes:          5.7%-6.4% Diabetes:              >6.4% Glycemic control for   <7.0% adults with diabetes   03/19/2016 03:01 AM 5.4 4.8 - 5.6 % Final    Comment:    (NOTE)         Pre-diabetes: 5.7 - 6.4         Diabetes: >6.4         Glycemic control for adults with diabetes: <7.0     CBG: No results for input(s): GLUCAP in the last 168 hours.  Review of  Systems:   Review of Systems  Constitutional:  Negative for chills and fever.  Respiratory:  Positive for cough and shortness of breath. Negative for hemoptysis, sputum production and wheezing.   Cardiovascular:  Negative for chest pain and leg swelling.  Gastrointestinal:  Negative for nausea and vomiting.   Past Medical History:  She,  has a past medical history of Asthma, Cancer (HCC), Depression, Hypertension, Peripheral vascular disease, Pneumonia, Sleep apnea, and Stroke (HCC).   Surgical History:   Past Surgical History:  Procedure Laterality Date   BIOPSY  08/16/2021   Procedure: BIOPSY;  Surgeon: Elicia Claw,  MD;  Location: [facility] ENDOSCOPY;  Service: Gastroenterology;;   COLONOSCOPY WITH PROPOFOL  N/A 08/16/2021   Procedure: COLONOSCOPY WITH PROPOFOL ;  Surgeon: Elicia Claw, MD;  Location: [facility] ENDOSCOPY;  Service: Gastroenterology;  Laterality: N/A;   DIRECT LARYNGOSCOPY Left 06/29/2016   Procedure: WIDE EXCISION LEFT TONGUE CANCER DIRECT LARYNGOSCOPY;  Surgeon: Arlana Arnt, MD;  Location: [facility] OR;  Service: ENT;  Laterality: Left;   ESOPHAGOGASTRODUODENOSCOPY (EGD) WITH PROPOFOL  Left 07/12/2018   Procedure: ESOPHAGOGASTRODUODENOSCOPY (EGD) WITH PROPOFOL ;  Surgeon: Dianna Specking, MD;  Location: [facility] ENDOSCOPY;  Service: Endoscopy;  Laterality: Left;   ESOPHAGOGASTRODUODENOSCOPY (EGD) WITH PROPOFOL  N/A 08/16/2021   Procedure: ESOPHAGOGASTRODUODENOSCOPY (EGD) WITH PROPOFOL ;  Surgeon: Elicia Claw, MD;  Location: [facility] ENDOSCOPY;  Service: Gastroenterology;  Laterality: N/A;   ESOPHAGOSCOPY N/A 06/29/2016   Procedure: ESOPHAGOSCOPY/BRONCHOSCOPY;  Surgeon: Arlana Arnt, MD;  Location: [facility] OR;  Service: ENT;  Laterality: N/A;   EYE SURGERY Bilateral 2016   HEMOSTASIS CLIP PLACEMENT  08/16/2021   Procedure: HEMOSTASIS CLIP PLACEMENT;  Surgeon: Elicia Claw, MD;  Location: [facility] ENDOSCOPY;  Service: Gastroenterology;;   HOT HEMOSTASIS N/A 07/12/2018   Procedure: HOT HEMOSTASIS  (ARGON PLASMA COAGULATION/BICAP);  Surgeon: Dianna Specking, MD;  Location[facility] ENDOSCOPY;  Service: Endoscopy;  Laterality: N/A;   HOT HEMOSTASIS N/A 08/16/2021   Procedure: HOT HEMOSTASIS (ARGON PLASMA COAGULATION/BICAP);  Surgeon: Elicia Claw, MD;  Location: [facility] ENDOSCOPY;  Service: Gastroenterology;  Laterality: N/A;   KNEE SURGERY Right 2014   LOOP RECORDER INSERTION N/A 03/21/2016   Procedure: Loop Recorder Insertion;  Surgeon: Will Gladis Norton, MD;  Location: [facility] INVASIVE CV LAB;  Service: Cardiovascular;  Laterality: N/A;   PARS PLANA VITRECTOMY Right 02/28/2014   Procedure: PARS PLANA VITRECTOMY 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 WITH TAP AND ANTIBIOTIC INJECTION;  Surgeon: Arley DELENA Ruder, MD;  Location: [facility] OR;  Service: Ophthalmology;  Laterality: Right;   POLYPECTOMY  08/16/2021   Procedure: POLYPECTOMY;  Surgeon: Elicia Claw, MD;  Location: [facility] ENDOSCOPY;  Service: Gastroenterology;;   SCLEROTHERAPY  08/16/2021   Procedure: MATIAS;  Surgeon: Elicia Claw, MD;  Location: [facility] ENDOSCOPY;  Service: Gastroenterology;;   TEE WITHOUT CARDIOVERSION N/A 03/21/2016   Procedure: TRANSESOPHAGEAL ECHOCARDIOGRAM (TEE);  Surgeon: Annabella Scarce, MD;  Location: [facility] ENDOSCOPY;  Service: Cardiovascular;  Laterality: N/A;     Social History:   reports that she quit smoking about 7 years ago. She has never used smokeless tobacco. She reports that she does not drink alcohol  and does not use drugs.   Family History:  Her family history includes Cancer in her mother; Heart disease in her brother; Stroke in her father.   Allergies Allergies  Allergen Reactions   Codeine Itching and Other (See Comments)    Caused mouth to break out in hives   Neosporin [Neomycin-Bacitracin Zn-Polymyx] Itching and Swelling    Swelling at application site   Percocet [Oxycodone -Acetaminophen ] Other (See Comments)    B-blockers, triggers depression   Tussionex Pennkinetic Er [Hydrocod Poli-Chlorphe Poli Er]  Itching     Home Medications  Prior to Admission medications   Medication Sig Start Date End Date Taking? Authorizing Provider  amLODipine  (NORVASC ) 10 MG tablet Take 1 tablet (10 mg total) by mouth daily. 05/09/18  Yes Ricky Fines, MD  atorvastatin  (LIPITOR) 10 MG tablet Take 10 mg by mouth daily.  03/16/16  Yes [provider]  clopidogrel  (PLAVIX ) 75 MG tablet Take 1 tablet (75 mg total) by mouth daily. 07/17/18  Yes Gherghe, Costin M, MD  escitalopram  (LEXAPRO ) 20 MG tablet Take 20  mg by mouth daily.  07/10/14  Yes [provider]  ferrous sulfate  325 (65 FE) MG tablet Take 1 tablet (325 mg total) by mouth daily with breakfast. Patient taking differently: Take 325 mg by mouth every other day. Monday, Wednesday, Friday 07/13/18  Yes Gherghe, Costin M, MD  losartan  (COZAAR ) 25 MG tablet Take 25 mg by mouth daily. 09/29/23  Yes [provider]  pantoprazole  (PROTONIX ) 40 MG tablet Take 1 tablet (40 mg total) by mouth daily. 07/13/18  Yes Gherghe, Costin M, MD  vitamin E 180 MG (400 UNITS) capsule Take 400 Units by mouth daily.   Yes [provider]     Critical care time: n/a     Lyle Pesa, NP [facility] 10/18/2023, 3:21 PM  See Amion for pager If no response to pager , please call [phone] until 7pm After 7:00 pm call Elink  [phone]

## 2023-10-18 NOTE — ED Provider Notes (Signed)
 [place] EMERGENCY DEPARTMENT AT [facility] Provider Note   CSN: [id number] Arrival date & time: 10/18/23  9066     Patient presents with: Shortness of Breath   [name] is a 79 y.o. female.   78 yo F with a chief complaint of difficulty breathing.  Going on for about 3 days.  Cough no fever.  No known sick contacts.  But says everyone where she lives is sick.  Denies chest pain or pressure.  Has abdominal pain.  Not really eating or drinking for a few days as well.  Denies urinary symptoms denies abdominal pain.   Shortness of Breath      Prior to Admission medications   Medication Sig Start Date End Date Taking? Authorizing Provider  amLODipine  (NORVASC ) 10 MG tablet Take 1 tablet (10 mg total) by mouth daily. 05/09/18  Yes Ricky Fines, MD  atorvastatin  (LIPITOR) 10 MG tablet Take 10 mg by mouth daily.  03/16/16  Yes [provider]  clopidogrel  (PLAVIX ) 75 MG tablet Take 1 tablet (75 mg total) by mouth daily. 07/17/18  Yes Gherghe, Costin M, MD  escitalopram  (LEXAPRO ) 20 MG tablet Take 20 mg by mouth daily.  07/10/14  Yes [provider]  ferrous sulfate  325 (65 FE) MG tablet Take 1 tablet (325 mg total) by mouth daily with breakfast. Patient taking differently: Take 325 mg by mouth every other day. Monday, Wednesday, Friday 07/13/18  Yes Gherghe, Costin M, MD  losartan  (COZAAR ) 25 MG tablet Take 25 mg by mouth daily. 09/29/23  Yes [provider]  pantoprazole  (PROTONIX ) 40 MG tablet Take 1 tablet (40 mg total) by mouth daily. 07/13/18  Yes Gherghe, Costin M, MD  vitamin E 180 MG (400 UNITS) capsule Take 400 Units by mouth daily.   Yes [provider]    Allergies: Codeine, Neosporin [neomycin-bacitracin zn-polymyx], Percocet [oxycodone -acetaminophen ], and Tussionex pennkinetic er [hydrocod poli-chlorphe poli er]    Review of Systems  Respiratory:  Positive for shortness of breath.     Updated Vital Signs BP (!)  127/56   Pulse 95   Temp 97.7 F (36.5 C) (Oral)   Resp (!) 21   Ht 5' (1.524 m)   Wt 56.7 kg   SpO2 94%   BMI 24.41 kg/m   Physical Exam Vitals and nursing note reviewed.  Constitutional:      General: She is not in acute distress.    Appearance: She is well-developed. She is not diaphoretic.  HENT:     Head: Normocephalic and atraumatic.  Eyes:     Pupils: Pupils are equal, round, and reactive to light.  Cardiovascular:     Rate and Rhythm: Normal rate and regular rhythm.     Heart sounds: No murmur heard.    No friction rub. No gallop.  Pulmonary:     Effort: Pulmonary effort is normal.     Breath sounds: No wheezing or rales.     Comments: Coarse breath sounds in all fields Abdominal:     General: There is no distension.     Palpations: Abdomen is soft.     Tenderness: There is no abdominal tenderness.  Musculoskeletal:        General: No tenderness.     Cervical back: Normal range of motion and neck supple.  Skin:    General: Skin is warm and dry.  Neurological:     Mental Status: She is alert and oriented to person, place, and time.  Psychiatric:  Behavior: Behavior normal.     (all labs ordered are listed, but only abnormal results are displayed) Labs Reviewed  COMPREHENSIVE METABOLIC PANEL WITH GFR - Abnormal; Notable for the following components:      Result Value   Potassium 3.3 (*)    CO2 16 (*)    Glucose, Bld 125 (*)    BUN 39 (*)    Creatinine, Ser 1.48 (*)    Calcium  7.8 (*)    Albumin 2.1 (*)    Total Bilirubin 1.3 (*)    GFR, Estimated 36 (*)    Anion gap 19 (*)    All other components within normal limits  CBC WITH DIFFERENTIAL/PLATELET - Abnormal; Notable for the following components:   WBC 18.2 (*)    RBC 3.81 (*)    Hemoglobin 9.4 (*)    HCT 30.1 (*)    MCV 79.0 (*)    MCH 24.7 (*)    RDW 15.7 (*)    Platelets 473 (*)    Neutro Abs 14.1 (*)    Monocytes Absolute 2.4 (*)    Abs Immature Granulocytes 0.25 (*)    All other  components within normal limits  URINALYSIS, W/ REFLEX TO CULTURE (INFECTION SUSPECTED) - Abnormal; Notable for the following components:   Color, Urine AMBER (*)    APPearance HAZY (*)    Hgb urine dipstick SMALL (*)    Ketones, ur 20 (*)    Protein, ur 100 (*)    Bacteria, UA RARE (*)    All other components within normal limits  I-STAT VENOUS BLOOD GAS, ED - Abnormal; Notable for the following components:   pCO2, Ven 29.0 (*)    pO2, Ven 153 (*)    Bicarbonate 18.6 (*)    TCO2 19 (*)    Acid-base deficit 5.0 (*)    Potassium 3.0 (*)    Calcium , Ion 1.03 (*)    HCT 30.0 (*)    Hemoglobin 10.2 (*)    All other components within normal limits  RESP PANEL BY RT-PCR (RSV, FLU A&B, COVID)  RVPGX2  CULTURE, BLOOD (ROUTINE X 2)  CULTURE, BLOOD (ROUTINE X 2)  EXPECTORATED SPUTUM ASSESSMENT W GRAM STAIN, RFLX TO RESP C  MRSA NEXT GEN BY PCR, NASAL  PROCALCITONIN  SEDIMENTATION RATE  C-REACTIVE PROTEIN  FUNGITELL BETA-D-GLUCAN  I-STAT CG4 LACTIC ACID, ED    EKG: EKG Interpretation Date/Time:  Wednesday October 18 2023 09:41:52 EDT Ventricular Rate:  103 PR Interval:  129 QRS Duration:  88 QT Interval:  359 QTC Calculation: 470 R Axis:   13  Text Interpretation: Sinus tachycardia Probable left atrial enlargement Low voltage, precordial leads Borderline ST depression, diffuse leads No significant change since last tracing Confirmed by Emil Share [phone]) on 10/18/2023 11:15:45 AM  Radiology: CT Angio Chest PE W and/or Wo Contrast Result Date: 10/18/2023 CLINICAL DATA:  Shortness of breath, hypoxia, cough and tachypnea. History squamous carcinoma of the tongue. Abnormal chest x-ray today demonstrating cavitary mass of the right lower lung. EXAM: CT ANGIOGRAPHY CHEST WITH CONTRAST TECHNIQUE: Multidetector CT imaging of the chest was performed using the standard protocol during bolus administration of intravenous contrast. Multiplanar CT image reconstructions and MIPs were obtained to  evaluate the vascular anatomy. RADIATION DOSE REDUCTION: This exam was performed according to the departmental dose-optimization program which includes automated exposure control, adjustment of the mA and/or kV according to patient size and/or use of iterative reconstruction technique. CONTRAST:  60mL OMNIPAQUE  IOHEXOL  350 MG/ML SOLN COMPARISON:  Chest x-ray today.  Prior CT chest 03/18/2016 FINDINGS: Cardiovascular: The pulmonary arteries are adequately opacified. There is no evidence of pulmonary embolism. Central pulmonary arteries are of normal caliber. The thoracic aorta is normal in caliber. No significant aortic atherosclerosis. The heart size is normal. No pericardial fluid identified. No visualized calcified coronary artery plaque. Mediastinum/Nodes: Confluent soft tissue in the subcarinal space likely representing lymphadenopathy or contiguous tumor from the hilum or lung. Abnormal soft tissue throughout the right hilum also represents either contiguous tumor or lymph node enlargement. Lungs/Pleura: Extensive pulmonary abnormality involving the entire right lower lobe and extending into the right middle lobe and basilar aspect of the right upper lobe across the major fissure consists of mass-like consolidation of the basilar aspect of the right lower lobe with multiple airway occlusions including endobronchial tumor/debris extending into the bronchus intermedius and large cavitary component in the mid to upper aspect of the right lower lobe measuring up to approximately 8.5 cm in diameter and demonstrating thick irregular internal walls and internal cavitation communicating directly with lower lobe bronchi. There also is abnormal soft tissue in a bronchovascular distribution within the lingula extending to the subpleural surface. While pneumonia is in the differential, conglomeration of findings including the abnormal soft tissue in the right hilum and subcarinal space is most concerning for extensive  pulmonary malignancy. Recommend eventual correlation bronchoscopy. No associated pleural fluid or pneumothorax. Upper Abdomen: On the relative arterial phase, the liver is not optimally evaluated for lesions but no discrete liver lesions are identified. There is lobulated contour of the superior and lateral aspect of the right kidney which is not cystic and measures up to approximately 3.8 cm in estimated diameter. This is suspicious for a potential solid right renal mass. Musculoskeletal: No chest wall abnormality. No acute or significant osseous findings. Review of the MIP images confirms the above findings. IMPRESSION: 1. No evidence of pulmonary embolism. 2. Extensive pulmonary abnormality involving the entire right lower lobe and extending into the right middle lobe and basilar aspect of the right upper lobe across the major fissure. This consists of mass-like consolidation of the basilar aspect of the right lower lobe with multiple airway occlusions including endobronchial tumor/debris extending into the bronchus intermedius and large cavitary component in the mid to upper aspect of the right lower lobe measuring up to approximately 8.5 cm in diameter and demonstrating thick irregular internal walls and internal cavitation communicating directly with lower lobe bronchi. There also is abnormal soft tissue in a bronchovascular distribution within the lingula extending to the subpleural surface. While pneumonia is in the differential, conglomeration of findings including the abnormal soft tissue in the right hilum and subcarinal space is most concerning for extensive pulmonary malignancy. Recommend eventual correlation bronchoscopy. 3. Lobulated contour of the superior and lateral aspect of the right kidney which is not cystic and measures up to approximately 3.8 cm in estimated diameter. This is suspicious for a potential solid right renal mass. Recommend further evaluation with either renal mass protocol CT or  MRI of the abdomen with and without contrast when the patient is able to tolerate further examination and/or additional contrast load. Electronically Signed   By: Marcey Moan M.D.   On: 10/18/2023 13:19   DG Chest Port 1 View if patient is in a treatment room. Result Date: 10/18/2023 CLINICAL DATA:  Shortness of breath.  Possible sepsis. EXAM: PORTABLE CHEST 1 VIEW COMPARISON:  March 18, 2016. FINDINGS: The heart size and mediastinal contours are within normal limits.  Large cavitary lesion is seen in right lower lobe concerning for cavitary pneumonia or neoplasm. Right basilar pneumonia or atelectasis is noted with possible effusion. Left lung is unremarkable. The visualized skeletal structures are unremarkable. IMPRESSION: Large cavitary lesion seen in right lower lobe concerning for cavitary pneumonia or neoplasm. CT scan of the chest with intravenous contrast is recommended for further evaluation. Electronically Signed   By: Lynwood Landy Raddle M.D.   On: 10/18/2023 10:20     .Critical Care  Performed by: Emil Share, DO Authorized by: Emil Share, DO   Critical care provider statement:    Critical care time (minutes):  35   Critical care time was exclusive of:  Separately billable procedures and treating other patients   Critical care was time spent personally by me on the following activities:  Development of treatment plan with patient or surrogate, discussions with consultants, evaluation of patient's response to treatment, examination of patient, ordering and review of laboratory studies, ordering and review of radiographic studies, ordering and performing treatments and interventions, pulse oximetry, re-evaluation of patient's condition and review of old charts   Care discussed with: admitting provider      Medications Ordered in the ED  enoxaparin  (LOVENOX ) injection 30 mg (has no administration in time range)  sodium chloride  flush (NS) 0.9 % injection 3 mL (3 mLs Intravenous Given  10/18/23 1538)  acetaminophen  (TYLENOL ) tablet 650 mg (has no administration in time range)    Or  acetaminophen  (TYLENOL ) suppository 650 mg (has no administration in time range)  ondansetron  (ZOFRAN ) tablet 4 mg (has no administration in time range)    Or  ondansetron  (ZOFRAN ) injection 4 mg (has no administration in time range)  albuterol  (PROVENTIL ) (2.5 MG/3ML) 0.083% nebulizer solution 2.5 mg (has no administration in time range)  guaiFENesin (MUCINEX) 12 hr tablet 600 mg (600 mg Oral Given 10/18/23 1537)  arformoterol (BROVANA) nebulizer solution 15 mcg (has no administration in time range)  aspirin  EC tablet 81 mg (has no administration in time range)  piperacillin-tazobactam (ZOSYN) IVPB 3.375 g (has no administration in time range)  vancomycin  (VANCOCIN ) IVPB 1000 mg/200 mL premix (has no administration in time range)  lactated ringers  bolus 2,000 mL (0 mLs Intravenous Stopped 10/18/23 1203)  ipratropium-albuterol  (DUONEB) 0.5-2.5 (3) MG/3ML nebulizer solution 3 mL (3 mLs Nebulization Given 10/18/23 1038)  vancomycin  (VANCOREADY) IVPB 1250 mg/250 mL (0 mg Intravenous Stopped 10/18/23 1400)  piperacillin-tazobactam (ZOSYN) IVPB 3.375 g (0 g Intravenous Stopped 10/18/23 1203)  iohexol  (OMNIPAQUE ) 350 MG/ML injection 60 mL (60 mLs Intravenous Contrast Given 10/18/23 1242)  sodium chloride  HYPERTONIC 3 % nebulizer solution 4 mL (4 mLs Nebulization Given 10/18/23 1538)                                    Medical Decision Making Amount and/or Complexity of Data Reviewed Labs: ordered. Radiology: ordered.  Risk Prescription drug management. Decision regarding hospitalization.   78 yo F with a chief complaints of difficulty breathing.  Going on for a few days now.  Obtain a chest x-ray blood work.  Hypoxic with EMS put on 6 L with improvement.  Tachypneic with coarse breath sounds bilaterally.  Trial DuoNeb.  Chest x-ray on my independent interpretation concerning for right-sided  cavitary lesion.  CT imaging radiology read perhaps more consistent with primary malignancy.  I discussed the case with critical care, Jeralyn Banner will come and see the patient  at bedside.  Will discuss with medicine.  The patients results and plan were reviewed and discussed.   Any x-rays performed were independently reviewed by myself.   Differential diagnosis were considered with the presenting HPI.  Medications  enoxaparin  (LOVENOX ) injection 30 mg (has no administration in time range)  sodium chloride  flush (NS) 0.9 % injection 3 mL (3 mLs Intravenous Given 10/18/23 1538)  acetaminophen  (TYLENOL ) tablet 650 mg (has no administration in time range)    Or  acetaminophen  (TYLENOL ) suppository 650 mg (has no administration in time range)  ondansetron  (ZOFRAN ) tablet 4 mg (has no administration in time range)    Or  ondansetron  (ZOFRAN ) injection 4 mg (has no administration in time range)  albuterol  (PROVENTIL ) (2.5 MG/3ML) 0.083% nebulizer solution 2.5 mg (has no administration in time range)  guaiFENesin (MUCINEX) 12 hr tablet 600 mg (600 mg Oral Given 10/18/23 1537)  arformoterol (BROVANA) nebulizer solution 15 mcg (has no administration in time range)  aspirin  EC tablet 81 mg (has no administration in time range)  piperacillin-tazobactam (ZOSYN) IVPB 3.375 g (has no administration in time range)  vancomycin  (VANCOCIN ) IVPB 1000 mg/200 mL premix (has no administration in time range)  lactated ringers  bolus 2,000 mL (0 mLs Intravenous Stopped 10/18/23 1203)  ipratropium-albuterol  (DUONEB) 0.5-2.5 (3) MG/3ML nebulizer solution 3 mL (3 mLs Nebulization Given 10/18/23 1038)  vancomycin  (VANCOREADY) IVPB 1250 mg/250 mL (0 mg Intravenous Stopped 10/18/23 1400)  piperacillin-tazobactam (ZOSYN) IVPB 3.375 g (0 g Intravenous Stopped 10/18/23 1203)  iohexol  (OMNIPAQUE ) 350 MG/ML injection 60 mL (60 mLs Intravenous Contrast Given 10/18/23 1242)  sodium chloride  HYPERTONIC 3 % nebulizer solution 4  mL (4 mLs Nebulization Given 10/18/23 1538)    Vitals:   10/18/23 1330 10/18/23 1345 10/18/23 1400 10/18/23 1415  BP: (!) 126/57 119/69 136/60 (!) 127/56  Pulse: 96 94 98 95  Resp: (!) 25 (!) 29 17 (!) 21  Temp:      TempSrc:      SpO2: 96% 96% 93% 94%  Weight:      Height:        Final diagnoses:  Acute respiratory failure with hypoxia (HCC)    Admission/ observation were discussed with the admitting physician, patient and/or family and they are comfortable with the plan.       Final diagnoses:  Acute respiratory failure with hypoxia [facility])    ED Discharge Orders     None          Emil Share, DO 10/18/23 1549

## 2023-10-18 NOTE — Progress Notes (Addendum)
 Upon shift change rapid was called on pt due to increased SOB and sats in the 80s, pt came up from ER on [facility] , has been increased to 10LNC , sats are 90-92% , pt states that breathing has improved . Provider on call, respiratory , charge RN and rapid RN aware of the above. See new orders.   10/18/23 1932  Vitals  Temp (!) 97.2 F (36.2 C)  Temp Source Axillary  BP (!) 141/71  MAP (mmHg) 90  BP Location Left Arm  BP Method Automatic  Patient Position (if appropriate) Lying  Pulse Rate Source Monitor  Resp (!) 22  Level of Consciousness  Level of Consciousness Alert  MEWS COLOR  MEWS Score Color Green  Oxygen Therapy  SpO2 90 %  O2 Device HFNC  O2 Flow Rate (L/min) 10 L/min  MEWS Score  MEWS Temp 0  MEWS Systolic 0  MEWS Pulse 0  MEWS RR 1  MEWS LOC 0  MEWS Score 1

## 2023-10-18 NOTE — Significant Event (Signed)
 Patient was seen at bedside was not in distress but requiring 10 L oxygen.  I reviewed patient's labs notes and medications.  Patient briefly went into A-fib with RVR heart rate went back into 90s heart rate but still in A-fib.  Will check TSH troponin 2D echo CHA2DS2-VASc score of at least 4.  Patient states she has not had any recent bleed or trauma or surgery.  Will keep patient on heparin infusion.  Closely monitor.  Redia Cleaver

## 2023-10-18 NOTE — ED Triage Notes (Signed)
 Pt to ED via [facility] from [facility]. Pt's daughter called staff after speaking with pt because she did not sound right. When nurse went to check on pt, pt's 02 sat - 80%. Pt has had increasing shortness of breath over past few days. Pt has rhonci/rales per EMS. Pt has wet cough, non productive. Pt short of breath on arrival, tachypnic.   Pt 88% RA on EMS arrival. Pt received:  Duoneb x 2 Solumedrol 125mg  IV NS per EMS sepsis criteria  EMS VS 134/82 110 afib RR 36 ETC02 17 98% on neb tx

## 2023-10-18 NOTE — Progress Notes (Signed)
 After administration of subcu lovenox  pt complained of pain and HR spiked to 140s-150s a-fib per monitor. EKG showed a-fib rvr , provider notified see new orders . Per tele tech pt was in a-fib 140s-150s from 2119 til 2132, then converted back to SR. Vitals other wise stable , current HR SR 99. Pt states she feels sob but no different than previously , pt admitted for SOB, sats 92% on 10L high flow.   10/18/23 2120  Vitals  BP 139/72  MAP (mmHg) 91  BP Location Left Arm  BP Method Automatic  Patient Position (if appropriate) Lying  Pulse Rate (!) 147  Pulse Rate Source Monitor  ECG Heart Rate (!) 145  Resp (!) 24  Level of Consciousness  Level of Consciousness Alert  MEWS COLOR  MEWS Score Color Red  Oxygen Therapy  SpO2 92 %  O2 Device HFNC  O2 Flow Rate (L/min) 10 L/min  ECG Monitoring  QRS interval 0.12  QT interval 0.24  CV Strip Heart Rate 142  Cardiac Rhythm Atrial fibrillation (Delta Pichon RN AWARE CB/CMT)  MEWS Score  MEWS Temp 0  MEWS Systolic 0  MEWS Pulse 3  MEWS RR 1  MEWS LOC 0  MEWS Score 4

## 2023-10-18 NOTE — H&P (Signed)
 History and Physical    Patient: [name] FMW:[id number] DOB: January 21, 1945 DOA: 10/18/2023 DOS: the patient was seen and examined on 10/18/2023 PCP: Onita Rush, MD  Patient coming from: [facility] with via EMS  Chief Complaint:  Chief Complaint  Patient presents with   Shortness of Breath   HPI: [name] is a 78 y.o. 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  She has been experiencing breathing issues for the past three days.  Denies recent weight loss, chest pain, or hemoptysis. She has been coughing frequently but is not producing sputum.  She reports having diarrhea for the past three days, describing it as 'a little bit'.  Her social history is significant for a history of smoking, having smoked two packs per day for fifty years before quitting four years ago. She currently resides at [facility] independent living.  O2 saturations noted to be around 88% on room air.  She reports not normally being on oxygen at baseline.  Patient had been given 2 DuoNeb breathing treatments, Solu-Medrol 125 mg IV, and 250 mL of normal saline IV fluids.  In the emergency department patient was noted to be afebrile with heart rates elevated up to 106, respirations 17-34, blood pressures maintained, and O2 saturations currently maintained on6 L of nasal cannula oxygen.  abs noted WBC elevated 18.2 hemoglobin 9.4, platelets 473, potassium 3.3, BUN 39, creatinine 1.48, glucose 125, and anion gap 19.Chest x-ray initially noted concern for large cavitary lesion in the right lower lobe.  CT angiogram of the chest noted concerns for extensive cavitary lesion involving the entire right lower lobe and extending into the right middle lobe and basilar  aspect of the right upper lobe.  CT also made note of lobulated contour of the superior and lateral aspect of the right kidney which was  not cystic and measured up to approximately 3.8 cm making it suspicious for potential solid right renal mass. Patient had been given 2 L of lactated Ringer 's, Solu-Medrol 125 mg IV, DuoNeb breathing treatments, vancomycin , and cefepime.   Review of Systems: As mentioned in the history of present illness. All other systems reviewed and are negative. Past Medical History:  Diagnosis Date   Asthma    Cancer (HCC)    Depression    Hypertension    Peripheral vascular disease    Pneumonia    Sleep apnea    Stroke [facility])    Past Surgical History:  Procedure Laterality Date   BIOPSY  08/16/2021   Procedure: BIOPSY;  Surgeon: Elicia Claw, MD;  Location: [facility] ENDOSCOPY;  Service: Gastroenterology;;   COLONOSCOPY WITH PROPOFOL  N/A 08/16/2021   Procedure: COLONOSCOPY WITH PROPOFOL ;  Surgeon: Elicia Claw, MD;  Location: [facility] ENDOSCOPY;  Service: Gastroenterology;  Laterality: N/A;   DIRECT LARYNGOSCOPY Left 06/29/2016   Procedure: WIDE EXCISION LEFT TONGUE CANCER DIRECT LARYNGOSCOPY;  Surgeon: Arlana Arnt, MD;  Location: [facility] OR;  Service: ENT;  Laterality: Left;   ESOPHAGOGASTRODUODENOSCOPY (EGD) WITH PROPOFOL  Left 07/12/2018   Procedure: ESOPHAGOGASTRODUODENOSCOPY (EGD) WITH PROPOFOL ;  Surgeon: Dianna Specking, MD;  Location: [facility] ENDOSCOPY;  Service: Endoscopy;  Laterality: Left;   ESOPHAGOGASTRODUODENOSCOPY (EGD) WITH PROPOFOL  N/A 08/16/2021   Procedure: ESOPHAGOGASTRODUODENOSCOPY (EGD) WITH PROPOFOL ;  Surgeon: Elicia Claw, MD;  Location: [facility] ENDOS[facility];  Service: Gastroenterology;  Laterality: N/A;  ESOPHAGOSCOPY N/A 06/29/2016   Procedure: ESOPHAGOSCOPY/BRONCHOSCOPY;  Surgeon: Arlana Arnt, MD;  Location: [facility] OR;  Service: ENT;  Laterality: N/A;   EYE SURGERY Bilateral 2016   HEMOSTASIS CLIP PLACEMENT  08/16/2021   Procedure: HEMOSTASIS CLIP  PLACEMENT;  Surgeon: Elicia Claw, MD;  Location: [facility] ENDOSCOPY;  Service: Gastroenterology;;   HOT HEMOSTASIS N/A 07/12/2018   Procedure: HOT HEMOSTASIS (ARGON PLASMA COAGULATION/BICAP);  Surgeon: Dianna Specking, MD;  Location: [facility] ENDOSCOPY;  Service: Endoscopy;  Laterality: N/A;   HOT HEMOSTASIS N/A 08/16/2021   Procedure: HOT HEMOSTASIS (ARGON PLASMA COAGULATION/BICAP);  Surgeon: Elicia Claw, MD;  Location: [facility] ENDOSCOPY;  Service: Gastroenterology;  Laterality: N/A;   KNEE SURGERY Right 2014   LOOP RECORDER INSERTION N/A 03/21/2016   Procedure: Loop Recorder Insertion;  Surgeon: Will Gladis Norton, MD;  [facility] INVASIVE CV LAB;  Service: Cardiovascular;  Laterality: N/A;   PARS PLANA VITRECTOMY Right 02/28/2014   Procedure: PARS PLANA VITRECTOMY 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 WITH TAP AND ANTIBIOTIC INJECTION;  Surgeon: Arley DELENA Ruder, MD;  Location: [facility] OR;  Service: Ophthalmology;  Laterality: Right;   POLYPECTOMY  08/16/2021   Procedure: POLYPECTOMY;  Surgeon: Elicia Claw, MD;  Location: [facility] ENDOSCOPY;  Service: Gastroenterology;;   SCLEROTHERAPY  08/16/2021   Procedure: MATIAS;  Surgeon: Elicia Claw, MD;  Location: [facility] ENDOSCOPY;  Service: Gastroenterology;;   TEE WITHOUT CARDIOVERSION N/A 03/21/2016   Procedure: TRANSESOPHAGEAL ECHOCARDIOGRAM (TEE);  Surgeon: Annabella Scarce, MD;  Location: [facility] ENDOSCOPY;  Service: Cardiovascular;  Laterality: N/A;   Social History:  reports that she quit smoking about 7 years ago. She has never used smokeless tobacco. She reports that she does not drink alcohol  and does not use drugs.  Allergies  Allergen Reactions   Codeine Itching and Other (See Comments)    Caused mouth to break out in hives   Neosporin [Neomycin-Bacitracin Zn-Polymyx] Itching and Swelling    Swelling at application site   Percocet [Oxycodone -Acetaminophen ] Other (See Comments)    B-blockers, triggers depression   Tussionex Pennkinetic Er [Hydrocod  Poli-Chlorphe Poli Er] Itching    Family History  Problem Relation Age of Onset   Cancer Mother        colon   Stroke Father    Heart disease Brother     Prior to Admission medications   Medication Sig Start Date End Date Taking? Authorizing Provider  amLODipine  (NORVASC ) 10 MG tablet Take 1 tablet (10 mg total) by mouth daily. 05/09/18  Yes Ricky Fines, MD  atorvastatin  (LIPITOR) 10 MG tablet Take 10 mg by mouth daily.  03/16/16  Yes [provider]  clopidogrel  (PLAVIX ) 75 MG tablet Take 1 tablet (75 mg total) by mouth daily. 07/17/18  Yes Gherghe, Costin M, MD  escitalopram  (LEXAPRO ) 20 MG tablet Take 20 mg by mouth daily.  07/10/14  Yes [provider]  ferrous sulfate  325 (65 FE) MG tablet Take 1 tablet (325 mg total) by mouth daily with breakfast. Patient taking differently: Take 325 mg by mouth every other day. Monday, Wednesday, Friday 07/13/18  Yes Gherghe, Costin M, MD  losartan  (COZAAR ) 25 MG tablet Take 25 mg by mouth daily. 09/29/23  Yes [provider]  pantoprazole  (PROTONIX ) 40 MG tablet Take 1 tablet (40 mg total) by mouth daily. 07/13/18  Yes Gherghe, Costin M, MD  vitamin E 180 MG (400 UNITS) capsule Take 400 Units by mouth daily.   Yes [provider]    Physical Exam: Vitals:   10/18/23 1115 10/18/23 1130 10/18/23 1145 10/18/23  1245  BP: (!) 137/56 135/67 128/60 (!) 129/59  Pulse: 100 (!) 105 98 100  Resp: (!) 24 (!) 30 (!) 26 17  Temp:      TempSrc:      SpO2: 93% 93% 95% 93%  Weight:      Height:         Constitutional: Ill-appearing elderly female currently in no acute distress Eyes: PERRL, lids and conjunctivae normal ENMT: Mucous membranes are dry.  Normal dentition.  Neck: normal, supple  Respiratory: Decreased aeration with currently on 4 L nasal cannula oxygen Cardiovascular: Regular rate and rhythm, no murmurs / rubs / gallops. No extremity edema. 2+ pedal pulses. No carotid bruits.  Abdomen: no tenderness, no  masses palpated. No hepatosplenomegaly. Bowel sounds positive.  Musculoskeletal: no clubbing / cyanosis. No joint deformity upper and lower extremities. Good ROM, no contractures. Normal muscle tone.  Skin: no rashes, lesions, ulcers. No induration Neurologic: CN 2-12 grossly intact. Sensation intact, DTR normal. Strength 5/5 in all 4.  Psychiatric: Normal judgment and insight. Alert and oriented x 3. Normal mood.   Data Reviewed:  EKG reveals sinus tachycardia at 103 bpm.  Reviewed labs, imaging, pertinent records as documented  Assessment and Plan:  Acute respiratory failure with hypoxia Right lung mass Patient presents with 3-day history of progressively worsening shortness of breath and cough.  Imaging showed concern for right lower lobe cavitary concerning for infection versus malignancy given prior history of squamous cell carcinoma.  Patient had been started on empiric antibiotics of vancomycin  and cefepime. - Admit to a progressive bed - Continuous pulse oximetry with nasal cannula oxygen to maintain O2 saturation greater than 92%. - Wean oxygen as tolerated - Aspiration precautions with elevation head of bed - Check procalcitonin, ESR, CRP - Albuterol , Brovana, and hypertonic saline nebs - Continue vancomycin  and cefepime.  De-escalate when deemed medically appropriate - Appreciate PCCM consultative services, will follow-up for any further patients.  SIRS/sepsis Patient was noted to be tachycardic and tachypneic with white blood cell count elevated to 18.2 meeting SIRS criteria. Blood cultures were obtained.  Thought secondary to above. - Follow-up blood cultures - Check sputum culture if able - Antibiotics as noted above  Renal lesion CT note of lobulated contour of the superior and lateral aspect of the right kidney which was  not cystic and measured up to approximately 3.8 cm making it suspicious for potential solid right renal mass. - Consider dedicated renal CT with and  without contrast/ MRI for further evaluation once able  Diarrhea Acute.  Patient reports having mild diarrhea over the last day. - Monitor intake and output  Essential hypertension Blood pressures elevated up to 144/63 - Continue home blood pressure regimen  Microcytic anemia Hemoglobin noted to be 9.4 which appears around patient's baseline with low MCV and MCH - Check CBC and iron studies in a.m.  Chronic kidney disease stage IIIb Creatinine noted to be 1.48 with BUN 39.  Patient had been given 2 L of lactated Ringer 's while in the ED. - Recheck kidney function in a.m.  History of CVA History of right PCA stroke back in 04/2018 - Hold Plavix  for possible need of procedure - Continue statin  History of squamous cell carcinoma of the tongue Patient s/p glossectomy 06/2016  History of tobacco abuse Patient reports smoking for over 50 years approximately 2 packs/day on average.  GERD - Continue PPI  DVT prophylaxis: Lovenox   Advance Care Planning:   Code Status: Full Code  Consults: Pulmonology Family Communication: None  Severity of Illness: The appropriate patient status for this patient is INPATIENT. Inpatient status is judged to be reasonable and necessary in order to provide the required intensity of service to ensure the patient's safety. The patient's presenting symptoms, physical exam findings, and initial radiographic and laboratory data in the context of their chronic comorbidities is felt to place them at high risk for further clinical deterioration. Furthermore, it is not anticipated that the patient will be medically stable for discharge from the hospital within 2 midnights of admission.   * I certify that at the point of admission it is my clinical judgment that the patient will require inpatient hospital care spanning beyond 2 midnights from the point of admission due to high intensity of service, high risk for further deterioration and high frequency of  surveillance required.*  Author: Maximino DELENA Sharps, MD 10/18/2023 1:53 PM  For on call review [email or web address].

## 2023-10-18 NOTE — Progress Notes (Signed)
 Pharmacy Antibiotic Note  [name] is a 78 y.o. female for which pharmacy has been consulted for vancomycin  and zosyn dosing for pneumonia.  Patient with a history of Emphysema, CVA, HTN, HLD, GERD, GIB, PVD, CKD, squamous cell carcinoma on tongue. Patient presenting with SOB.  SCr 1.48 WBC 18.2; LA 1.3; T 97.7; HR 95; RR 21 COVID neg / flu neg  Plan: Zosyn 3.375g IV q8h (4 hour infusion) Vancomycin  1250 mg once then 1000 mg q48hr (eAUC 537.8) unless change in renal function Monitor WBC, fever, renal function, cultures De-escalate when able Levels at steady state  Height: 5' (152.4 cm) Weight: 56.7 kg (125 lb) IBW/kg (Calculated) : 45.5  Temp (24hrs), Avg:97.7 F (36.5 C), Min:97.7 F (36.5 C), Max:97.7 F (36.5 C)  Recent Labs  Lab 10/18/23 0937 10/18/23 0959  WBC 18.2*  --   CREATININE 1.48*  --   LATICACIDVEN  --  1.3    Estimated Creatinine Clearance: 24.3 mL/min (A) (by C-G formula based on SCr of 1.48 mg/dL (H)).    Allergies  Allergen Reactions   Codeine Itching and Other (See Comments)    Caused mouth to break out in hives   Neosporin [Neomycin-Bacitracin Zn-Polymyx] Itching and Swelling    Swelling at application site   Percocet [Oxycodone -Acetaminophen ] Other (See Comments)    B-blockers, triggers depression   Tussionex Pennkinetic Er [Hydrocod Poli-Chlorphe Poli Er] Itching   Microbiology results: Pending  Thank you for allowing pharmacy to be a part of this patient's care.  Dorn Buttner, PharmD, BCPS 10/18/2023 2:46 PM ED Clinical Pharmacist -  [phone]

## 2023-10-19 ENCOUNTER — Inpatient Hospital Stay (HOSPITAL_COMMUNITY)

## 2023-10-19 DIAGNOSIS — D72829 Elevated white blood cell count, unspecified: Secondary | ICD-10-CM | POA: Diagnosis not present

## 2023-10-19 DIAGNOSIS — R918 Other nonspecific abnormal finding of lung field: Secondary | ICD-10-CM | POA: Diagnosis not present

## 2023-10-19 DIAGNOSIS — K219 Gastro-esophageal reflux disease without esophagitis: Secondary | ICD-10-CM | POA: Diagnosis not present

## 2023-10-19 DIAGNOSIS — J9601 Acute respiratory failure with hypoxia: Secondary | ICD-10-CM | POA: Diagnosis not present

## 2023-10-19 DIAGNOSIS — I4891 Unspecified atrial fibrillation: Secondary | ICD-10-CM

## 2023-10-19 LAB — COMPREHENSIVE METABOLIC PANEL WITH GFR
ALT: 14 U/L (ref 0–44)
AST: 24 U/L (ref 15–41)
Albumin: 1.9 g/dL — ABNORMAL LOW (ref 3.5–5.0)
Alkaline Phosphatase: 103 U/L (ref 38–126)
Anion gap: 14 (ref 5–15)
BUN: 33 mg/dL — ABNORMAL HIGH (ref 8–23)
CO2: 19 mmol/L — ABNORMAL LOW (ref 22–32)
Calcium: 7.9 mg/dL — ABNORMAL LOW (ref 8.9–10.3)
Chloride: 101 mmol/L (ref 98–111)
Creatinine, Ser: 1.19 mg/dL — ABNORMAL HIGH (ref 0.44–1.00)
GFR, Estimated: 47 mL/min — ABNORMAL LOW (ref >60)
Glucose, Bld: 152 mg/dL — ABNORMAL HIGH (ref 70–99)
Potassium: 2.9 mmol/L — ABNORMAL LOW (ref 3.5–5.1)
Sodium: 134 mmol/L — ABNORMAL LOW (ref 135–145)
Total Bilirubin: 0.8 mg/dL (ref 0.0–1.2)
Total Protein: 5.8 g/dL — ABNORMAL LOW (ref 6.5–8.1)

## 2023-10-19 LAB — ECHOCARDIOGRAM COMPLETE
Area-P 1/2: 4.54 cm2
Calc EF: 59.2 %
Height: 60 in
S' Lateral: 3.2 cm
Single Plane A2C EF: 65.6 %
Single Plane A4C EF: 50.1 %
Weight: 2000 [oz_av]

## 2023-10-19 LAB — CBC
HCT: 27.6 % — ABNORMAL LOW (ref 36.0–46.0)
Hemoglobin: 8.9 g/dL — ABNORMAL LOW (ref 12.0–15.0)
MCH: 25.1 pg — ABNORMAL LOW (ref 26.0–34.0)
MCHC: 32.2 g/dL (ref 30.0–36.0)
MCV: 77.7 fL — ABNORMAL LOW (ref 80.0–100.0)
Platelets: 394 K/uL (ref 150–400)
RBC: 3.55 MIL/uL — ABNORMAL LOW (ref 3.87–5.11)
RDW: 15.8 % — ABNORMAL HIGH (ref 11.5–15.5)
WBC: 17.1 K/uL — ABNORMAL HIGH (ref 4.0–10.5)
nRBC: 0 % (ref 0.0–0.2)

## 2023-10-19 LAB — PROCALCITONIN: Procalcitonin: 2.4 ng/mL

## 2023-10-19 LAB — BODY FLUID CELL COUNT WITH DIFFERENTIAL
Eos, Fluid: 0 %
Lymphs, Fluid: 16 %
Monocyte-Macrophage-Serous Fluid: 12 % — ABNORMAL LOW (ref 50–90)
Neutrophil Count, Fluid: 72 % — ABNORMAL HIGH (ref 0–25)
Total Nucleated Cell Count, Fluid: 285 uL (ref 0–1000)

## 2023-10-19 LAB — IRON AND TIBC
Iron: 19 ug/dL — ABNORMAL LOW (ref 28–170)
Saturation Ratios: 13 % (ref 10.4–31.8)
TIBC: 153 ug/dL — ABNORMAL LOW (ref 250–450)
UIBC: 134 ug/dL

## 2023-10-19 LAB — BLOOD GAS, ARTERIAL
Acid-base deficit: 6.2 mmol/L — ABNORMAL HIGH (ref 0.0–2.0)
Bicarbonate: 19.6 mmol/L — ABNORMAL LOW (ref 20.0–28.0)
Drawn by: 53547
O2 Saturation: 94.5 %
Patient temperature: 36.4
pCO2 arterial: 38 mmHg (ref 32–48)
pH, Arterial: 7.32 — ABNORMAL LOW (ref 7.35–7.45)
pO2, Arterial: 65 mmHg — ABNORMAL LOW (ref 83–108)

## 2023-10-19 LAB — FERRITIN: Ferritin: 543 ng/mL — ABNORMAL HIGH (ref 11–307)

## 2023-10-19 LAB — POCT I-STAT 7, (LYTES, BLD GAS, ICA,H+H)
Acid-base deficit: 6 mmol/L — ABNORMAL HIGH (ref 0.0–2.0)
Acid-base deficit: 6 mmol/L — ABNORMAL HIGH (ref 0.0–2.0)
Bicarbonate: 23.3 mmol/L (ref 20.0–28.0)
Bicarbonate: 21.9 mmol/L (ref 20.0–28.0)
Calcium, Ion: 1.18 mmol/L (ref 1.15–1.40)
Calcium, Ion: 1.14 mmol/L — ABNORMAL LOW (ref 1.15–1.40)
HCT: 32 % — ABNORMAL LOW (ref 36.0–46.0)
HCT: 31 % — ABNORMAL LOW (ref 36.0–46.0)
Hemoglobin: 10.9 g/dL — ABNORMAL LOW (ref 12.0–15.0)
Hemoglobin: 10.5 g/dL — ABNORMAL LOW (ref 12.0–15.0)
O2 Saturation: 99 %
O2 Saturation: 98 %
Patient temperature: 97.6
Potassium: 4.1 mmol/L (ref 3.5–5.1)
Potassium: 3.9 mmol/L (ref 3.5–5.1)
Sodium: 133 mmol/L — ABNORMAL LOW (ref 135–145)
Sodium: 134 mmol/L — ABNORMAL LOW (ref 135–145)
TCO2: 25 mmol/L (ref 22–32)
TCO2: 24 mmol/L (ref 22–32)
pCO2 arterial: 65.3 mmHg (ref 32–48)
pCO2 arterial: 54.7 mmHg — ABNORMAL HIGH (ref 32–48)
pH, Arterial: 7.158 — CL (ref 7.35–7.45)
pH, Arterial: 7.211 — ABNORMAL LOW (ref 7.35–7.45)
pO2, Arterial: 169 mmHg — ABNORMAL HIGH (ref 83–108)
pO2, Arterial: 140 mmHg — ABNORMAL HIGH (ref 83–108)

## 2023-10-19 LAB — HEPARIN LEVEL (UNFRACTIONATED): Heparin Unfractionated: 0.47 [IU]/mL (ref 0.30–0.70)

## 2023-10-19 LAB — MRSA NEXT GEN BY PCR, NASAL: MRSA by PCR Next Gen: NOT DETECTED

## 2023-10-19 LAB — BASIC METABOLIC PANEL WITH GFR
Anion gap: 20 — ABNORMAL HIGH (ref 5–15)
BUN: 34 mg/dL — ABNORMAL HIGH (ref 8–23)
CO2: 14 mmol/L — ABNORMAL LOW (ref 22–32)
Calcium: 8 mg/dL — ABNORMAL LOW (ref 8.9–10.3)
Chloride: 99 mmol/L (ref 98–111)
Creatinine, Ser: 1.27 mg/dL — ABNORMAL HIGH (ref 0.44–1.00)
GFR, Estimated: 43 mL/min — ABNORMAL LOW (ref >60)
Glucose, Bld: 139 mg/dL — ABNORMAL HIGH (ref 70–99)
Potassium: 3.9 mmol/L (ref 3.5–5.1)
Sodium: 133 mmol/L — ABNORMAL LOW (ref 135–145)

## 2023-10-19 LAB — FOLATE: Folate: <3 ng/mL — ABNORMAL LOW (ref >5.9)

## 2023-10-19 LAB — GLUCOSE, CAPILLARY
Glucose-Capillary: 165 mg/dL — ABNORMAL HIGH (ref 70–99)
Glucose-Capillary: 167 mg/dL — ABNORMAL HIGH (ref 70–99)

## 2023-10-19 LAB — TROPONIN I (HIGH SENSITIVITY): Troponin I (High Sensitivity): 46 ng/L — ABNORMAL HIGH (ref <18)

## 2023-10-19 LAB — T4, FREE: Free T4: 1.32 ng/dL — ABNORMAL HIGH (ref 0.61–1.12)

## 2023-10-19 LAB — PHOSPHORUS: Phosphorus: 4.1 mg/dL (ref 2.5–4.6)

## 2023-10-19 LAB — MAGNESIUM
Magnesium: 2 mg/dL (ref 1.7–2.4)
Magnesium: 2.2 mg/dL (ref 1.7–2.4)

## 2023-10-19 LAB — BRAIN NATRIURETIC PEPTIDE: B Natriuretic Peptide: 505.9 pg/mL — ABNORMAL HIGH (ref 0.0–100.0)

## 2023-10-19 LAB — VITAMIN B12: Vitamin B-12: 310 pg/mL (ref 180–914)

## 2023-10-19 LAB — TSH: TSH: 0.265 u[IU]/mL — ABNORMAL LOW (ref 0.350–4.500)

## 2023-10-19 MED ORDER — CHLORHEXIDINE GLUCONATE CLOTH 2 % EX PADS
6.0000 | MEDICATED_PAD | Freq: Every day | CUTANEOUS | Status: DC
Start: 2023-10-19 — End: 2023-10-27
  Administered 2023-10-19 – 2023-10-26 (×8): 6 via TOPICAL

## 2023-10-19 MED ORDER — PROPOFOL 1000 MG/100ML IV EMUL
0.0000 ug/kg/min | INTRAVENOUS | Status: AC
  Administered 2023-10-19: 10 ug/kg/min via INTRAVENOUS
  Administered 2023-10-20: 30 ug/kg/min via INTRAVENOUS
  Administered 2023-10-20: 40 ug/kg/min via INTRAVENOUS
  Administered 2023-10-20 – 2023-10-21 (×3): 30 ug/kg/min via INTRAVENOUS
  Administered 2023-10-21: 45 ug/kg/min via INTRAVENOUS
  Administered 2023-10-21: 50 ug/kg/min via INTRAVENOUS
  Administered 2023-10-21: 40 ug/kg/min via INTRAVENOUS
  Administered 2023-10-22: 30 ug/kg/min via INTRAVENOUS
  Administered 2023-10-22 – 2023-10-23 (×2): 25 ug/kg/min via INTRAVENOUS
  Administered 2023-10-23: 35 ug/kg/min via INTRAVENOUS
  Filled 2023-10-19 (×12): qty 100

## 2023-10-19 MED ORDER — FENTANYL CITRATE PF 50 MCG/ML IJ SOSY
PREFILLED_SYRINGE | INTRAMUSCULAR | Status: AC
  Administered 2023-10-19: 50 ug
  Filled 2023-10-19: qty 2

## 2023-10-19 MED ORDER — MIDAZOLAM HCL 2 MG/2ML IJ SOLN
INTRAMUSCULAR | Status: AC
  Filled 2023-10-19: qty 2

## 2023-10-19 MED ORDER — ETOMIDATE 2 MG/ML IV SOLN
INTRAVENOUS | Status: AC
  Administered 2023-10-19: 20 mg
  Filled 2023-10-19: qty 20

## 2023-10-19 MED ORDER — METOPROLOL TARTRATE 25 MG PO TABS
25.0000 mg | ORAL_TABLET | Freq: Two times a day (BID) | ORAL | Status: DC
  Administered 2023-10-19: 25 mg via ORAL
  Filled 2023-10-19: qty 1

## 2023-10-19 MED ORDER — ONDANSETRON HCL 4 MG PO TABS: 4.0000 mg | ORAL_TABLET | Freq: Four times a day (QID) | ORAL | Status: DC | PRN

## 2023-10-19 MED ORDER — POLYETHYLENE GLYCOL 3350 17 G PO PACK
17.0000 g | PACK | Freq: Every day | ORAL | Status: DC
  Administered 2023-10-20 – 2023-10-21 (×2): 17 g
  Filled 2023-10-19 (×6): qty 1

## 2023-10-19 MED ORDER — GUAIFENESIN 100 MG/5ML PO LIQD
300.0000 mg | Freq: Four times a day (QID) | ORAL | Status: DC
  Administered 2023-10-20 (×2): 300 mg
  Filled 2023-10-19: qty 15

## 2023-10-19 MED ORDER — GUAIFENESIN 100 MG/5ML PO LIQD
300.0000 mg | Freq: Four times a day (QID) | ORAL | Status: DC
Start: 2023-10-19 — End: 2023-10-19
  Filled 2023-10-19 (×3): qty 15

## 2023-10-19 MED ORDER — ATORVASTATIN CALCIUM 10 MG PO TABS
10.0000 mg | ORAL_TABLET | Freq: Every day | ORAL | Status: DC
  Administered 2023-10-20 – 2023-10-26 (×7): 10 mg
  Filled 2023-10-19 (×7): qty 1

## 2023-10-19 MED ORDER — ASPIRIN 81 MG PO CHEW
81.0000 mg | CHEWABLE_TABLET | Freq: Every day | ORAL | Status: DC
  Administered 2023-10-20 – 2023-10-26 (×7): 81 mg
  Filled 2023-10-19 (×7): qty 1

## 2023-10-19 MED ORDER — ACETAMINOPHEN 650 MG RE SUPP: 650.0000 mg | Freq: Four times a day (QID) | RECTAL | Status: DC | PRN

## 2023-10-19 MED ORDER — METHIMAZOLE 5 MG PO TABS
5.0000 mg | ORAL_TABLET | Freq: Every day | ORAL | Status: DC
  Filled 2023-10-19: qty 1

## 2023-10-19 MED ORDER — FENTANYL CITRATE PF 50 MCG/ML IJ SOSY
50.0000 ug | PREFILLED_SYRINGE | INTRAMUSCULAR | Status: AC | PRN
  Administered 2023-10-19 – 2023-10-20 (×3): 50 ug via INTRAVENOUS
  Filled 2023-10-19 (×2): qty 1

## 2023-10-19 MED ORDER — PROPOFOL 1000 MG/100ML IV EMUL
INTRAVENOUS | Status: AC
  Filled 2023-10-19: qty 100

## 2023-10-19 MED ORDER — ONDANSETRON HCL 4 MG/2ML IJ SOLN: 4.0000 mg | Freq: Four times a day (QID) | INTRAMUSCULAR | Status: DC | PRN

## 2023-10-19 MED ORDER — ROCURONIUM BROMIDE 10 MG/ML (PF) SYRINGE
PREFILLED_SYRINGE | INTRAVENOUS | Status: AC
  Administered 2023-10-19: 80 mg
  Filled 2023-10-19: qty 10

## 2023-10-19 MED ORDER — FAMOTIDINE 20 MG PO TABS: 20.0000 mg | ORAL_TABLET | Freq: Two times a day (BID) | ORAL | Status: DC

## 2023-10-19 MED ORDER — METHYLPREDNISOLONE SODIUM SUCC 125 MG IJ SOLR
80.0000 mg | Freq: Every day | INTRAMUSCULAR | Status: DC
  Administered 2023-10-19 – 2023-10-22 (×4): 80 mg via INTRAVENOUS
  Filled 2023-10-19 (×4): qty 2

## 2023-10-19 MED ORDER — METOPROLOL TARTRATE 25 MG PO TABS
25.0000 mg | ORAL_TABLET | Freq: Two times a day (BID) | ORAL | Status: DC
Start: 2023-10-19 — End: 2023-10-22
  Administered 2023-10-19 – 2023-10-22 (×5): 25 mg
  Filled 2023-10-19 (×6): qty 1

## 2023-10-19 MED ORDER — IPRATROPIUM-ALBUTEROL 0.5-2.5 (3) MG/3ML IN SOLN
3.0000 mL | Freq: Four times a day (QID) | RESPIRATORY_TRACT | Status: DC
  Administered 2023-10-19 – 2023-10-24 (×20): 3 mL via RESPIRATORY_TRACT
  Filled 2023-10-19 (×21): qty 3

## 2023-10-19 MED ORDER — ASPIRIN 81 MG PO TBEC
81.0000 mg | DELAYED_RELEASE_TABLET | Freq: Every day | ORAL | Status: DC
  Filled 2023-10-19 (×2): qty 1

## 2023-10-19 MED ORDER — ACETAMINOPHEN 325 MG PO TABS: 650.0000 mg | ORAL_TABLET | Freq: Four times a day (QID) | ORAL | Status: DC | PRN

## 2023-10-19 MED ORDER — DOCUSATE SODIUM 50 MG/5ML PO LIQD
100.0000 mg | Freq: Two times a day (BID) | ORAL | Status: DC
  Administered 2023-10-19 – 2023-10-22 (×6): 100 mg
  Filled 2023-10-19 (×8): qty 10

## 2023-10-19 MED ORDER — PANTOPRAZOLE SODIUM 40 MG IV SOLR
40.0000 mg | Freq: Every day | INTRAVENOUS | Status: DC
  Administered 2023-10-19 – 2023-10-25 (×7): 40 mg via INTRAVENOUS
  Filled 2023-10-19 (×7): qty 10

## 2023-10-19 MED ORDER — FENTANYL CITRATE PF 50 MCG/ML IJ SOSY
50.0000 ug | PREFILLED_SYRINGE | INTRAMUSCULAR | Status: DC | PRN
  Administered 2023-10-20: 50 ug via INTRAVENOUS
  Administered 2023-10-20: 100 ug via INTRAVENOUS
  Administered 2023-10-20 (×2): 50 ug via INTRAVENOUS
  Administered 2023-10-20: 100 ug via INTRAVENOUS
  Administered 2023-10-20: 50 ug via INTRAVENOUS
  Administered 2023-10-20: 100 ug via INTRAVENOUS
  Administered 2023-10-23: 50 ug via INTRAVENOUS
  Administered 2023-10-23 (×2): 100 ug via INTRAVENOUS
  Administered 2023-10-23: 50 ug via INTRAVENOUS
  Filled 2023-10-19 (×2): qty 2
  Filled 2023-10-19: qty 1
  Filled 2023-10-19: qty 2
  Filled 2023-10-19: qty 1
  Filled 2023-10-19: qty 2
  Filled 2023-10-19 (×2): qty 1
  Filled 2023-10-19: qty 2
  Filled 2023-10-19 (×3): qty 1

## 2023-10-19 MED ORDER — POTASSIUM CHLORIDE 20 MEQ PO PACK
40.0000 meq | PACK | ORAL | Status: AC
  Administered 2023-10-19 (×2): 40 meq via ORAL
  Filled 2023-10-19 (×2): qty 2

## 2023-10-19 MED ORDER — DILTIAZEM HCL-DEXTROSE 125-5 MG/125ML-% IV SOLN (PREMIX)
5.0000 mg/h | INTRAVENOUS | Status: DC
  Administered 2023-10-19: 10 mg/h via INTRAVENOUS
  Administered 2023-10-19: 15 mg/h via INTRAVENOUS
  Administered 2023-10-19: 5 mg/h via INTRAVENOUS
  Administered 2023-10-19: 10 mg/h via INTRAVENOUS
  Filled 2023-10-19 (×2): qty 125

## 2023-10-19 MED ORDER — PHENYLEPHRINE 80 MCG/ML (10ML) SYRINGE FOR IV PUSH (FOR BLOOD PRESSURE SUPPORT)
PREFILLED_SYRINGE | INTRAVENOUS | Status: AC
  Filled 2023-10-19: qty 10

## 2023-10-19 MED ORDER — POTASSIUM CHLORIDE CRYS ER 20 MEQ PO TBCR
20.0000 meq | EXTENDED_RELEASE_TABLET | Freq: Once | ORAL | Status: AC
  Administered 2023-10-19: 20 meq via ORAL
  Filled 2023-10-19: qty 1

## 2023-10-19 MED ORDER — ESCITALOPRAM OXALATE 10 MG PO TABS
20.0000 mg | ORAL_TABLET | Freq: Every day | ORAL | Status: DC
  Administered 2023-10-20 – 2023-10-26 (×7): 20 mg
  Filled 2023-10-19 (×7): qty 2

## 2023-10-19 MED ORDER — METHIMAZOLE 5 MG PO TABS
5.0000 mg | ORAL_TABLET | Freq: Every day | ORAL | Status: DC
  Administered 2023-10-20: 5 mg
  Filled 2023-10-19: qty 1

## 2023-10-19 NOTE — Consult Note (Addendum)
 NAME:  ELLA GOLOMB, MRN:  [id number], DOB:  28-Jun-1945, LOS: 1 ADMISSION DATE:  10/18/2023, CONSULTATION DATE:  10/18/23 REFERRING MD:  Dr. Emil, CHIEF COMPLAINT:  SOB   History of Present Illness:   61 yoF with hx of former smoker (quit 2020), emphysema, CVA on plavix , HTN, HLD, GERD, anxiety, depression and GIB (AVMs-duodenal, cecal and right colon), OSA (denies), PVD, CKD3a, IDA, 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  Presented from independent living facility after found to have noisy breathing and several days of progressive SOB found to be hypoxic in the 80's.  Pt states has been SOB for 3 days with new dry cough and poor PO intake related to dyspnea.  Denies fever, chills, chest pain, wt loss (although appears to have 19lb wt loss since 01/2023), hemoptysis, dysphagia or choking episodes. Does not use oxygen, nebs/ inhalers at baseline and denies OSA.   Treated in ER with O2, nebs, solumedrol.  Afebrile in ER, requiring 6L.  WBC 18.2, Hgb 9.4, sCr 1.48, ABG 7.414/ 29/ 153/ 18.6.  SARS/ flu/ RSV neg. CXR concerning for RLL caviatry lesion.  Empiric vancomycin  and zosyn started. CTA PE obtained, negative for PE, but extensive abnormality of RLL, RML and basilar aspect of RUE with mass like consolidation in RLL with multiple airway occlusions including endobronchial tumor/ debris and large 8.5 cm cavitary lesion of RLL with communication directly into lower lobe bronchi concerning for extensive pulmonary malignancy, pneumonia remains in differential.  Abnormal bronchovascular distribution within lingula extending into subpleural surface.  Also noted right renal abnormal contour suspicious for solid renal mass. Admitted to [facility] with Pulmonary consulting for further recomendations.   Pertinent  Medical History  former smoker (quit 2018), emphysema, CVA on plavix , HTN, HLD, GERD, anxiety, depression and GIB (AVMs-duodenal, cecal and right colon), OSA (denies), PVD, CKD3a, IDA,  squamous cell carcinoma of tongue s/p excision (2018)  Significant Hospital Events: Including procedures, antibiotic start and stop dates in addition to other pertinent events   10/1 admitted  Interim History / Subjective:  Resp status has deteriorated unfortunately Unable to cough up anything  Objective    Blood pressure (!) 154/73, pulse 84, temperature 97.6 F (36.4 C), temperature source Oral, resp. rate (!) 42, height 5' (1.524 m), weight 56.7 kg, SpO2 99%.    Vent Mode: PRVC FiO2 (%):  [60 %] 60 % Set Rate:  [20 bmp] 20 bmp Vt Set:  [370 mL] 370 mL PEEP:  [5 cmH20] 5 cmH20 Plateau Pressure:  [15 cmH20] 15 cmH20   Intake/Output Summary (Last 24 hours) at 10/19/2023 1802 Last data filed at 10/19/2023 0310 Gross per 24 hour  Intake 124.54 ml  Output --  Net 124.54 ml   Filed Weights   10/18/23 0942  Weight: 56.7 kg   Examination: Ill appearing tripoding Harsh rhonci on R MM dry, stable assymetric palate elevation on R Aox3 Abd soft No edema  Resolved problem list   Assessment and Plan   Abnormal lung imaging with large RLL cavitary lesion, former smoker and prior tongue SCC.  ddx concerning for malignant process vs infectious process, ?aspiration.  CT also concerning for right renal mass Hypoxic respiratory failure  GERD Leukocytosis  Hx emphysema  P:   Worsening respiratory distress  Discussed options of watchful waiting and potential emergent ETT, semi-urgent intubation and bronch or no escalation if breathing gets worse  She would like to know what is going on, infection vs. Malignancy  Intubate, bronch, vent bundle  May need GOC discussion depending on what we find  33 min cc time  Rolan Sharps MD PCCM

## 2023-10-19 NOTE — Progress Notes (Signed)
 eLink Physician-Brief Progress Note Patient Name: [name] DOB: 1945/08/12 MRN: [id number]   Date of Service  10/19/2023  HPI/Events of Note  ABG 7.21/54/140/21 Resp acidosis improved compared to 1830  eICU Interventions  Continue present settings Repeat abg in 2 hrs     Intervention Category Intermediate Interventions: Other:  [name], P 10/19/2023, 8:30 PM

## 2023-10-19 NOTE — Significant Event (Signed)
 Rapid Response Event Note   Reason for Call :  Respiratory distress  Initial Focused Assessment:  Patient sitting EOB, tripod, using accessory muscles. Wet/rhonchi lungs heard from doorway. Skin warm/dry. NTS thick secretions, some improvement though pt remains with increased WOB. Decision made to tx ICU and intubate.   141/71 (85) HR 82 RR 34 O2 87% 10L [place]  Interventions/Plan of Care:  NTS Neb Steroid ordered MD TRH to bedside ABG CCM to bedside ICU transfer, plan to intubate/bronch  Event Summary:  MD Notified: [name] MD Call Time: 1440 Arrival Time:  End Time: 8384  Melissa Chiquita POUR, RN

## 2023-10-19 NOTE — Progress Notes (Signed)
  Echocardiogram 2D Echocardiogram has been performed.  [name] 10/19/2023, 2:42 PM

## 2023-10-19 NOTE — Progress Notes (Signed)
 Critical ABG results given to Dr Claudene at 1840 pH 7.158 pCO2 65.3 pO2 169 HCO3 23.3  Rec increase RR to 28, Vt 450. Repeat ABG at [facility]

## 2023-10-19 NOTE — TOC Initial Note (Signed)
 Transition of Care [facility]) - Initial/Assessment Note    Patient Details  Name: [name] MRN: [id number] Date of Birth: 07/14/1945  [facility] CM/SW Contact:    Lauraine FORBES Saa, LCSWA Phone Number: 10/19/2023, 4:32 PM  Clinical Narrative:                  4:32 PM Per chart review, patient is from [facility]. Patient has a PCP and insurance. Patient has SNF history with [facility]. Patient has HH history with [facility]. Patient does not have DME history. Patient's preferred pharmacy is [facility]. SDOH needs were reviewed. No TOC needs identified at this time. TOC will continue to follow.  Expected Discharge Plan: Home/Self Care Barriers to Discharge: Continued Medical Work up   Patient Goals and CMS Choice            Expected Discharge Plan and Services   Discharge Planning Services: CM Consult   Living arrangements for the past 2 months: Independent Living Facility                                      Prior Living Arrangements/Services Living arrangements for the past 2 months: Independent Living Facility Lives with:: Facility Resident Patient language and need for interpreter reviewed:: Yes            Current home services: DME Criminal Activity/Legal Involvement Pertinent to Current Situation/Hospitalization: No - Comment as needed  Activities of Daily Living      Permission Sought/Granted Permission sought to share information with : Family Supports, Oceanographer granted to share information with : No (Contact information on chart)  Share Information with NAME: Norleen Little  Permission granted to share info w AGENCY: [facility]  Permission granted to share info w Relationship: Brother  Permission granted to share info w Contact Information: [phone]  Emotional Assessment       Orientation: : Oriented to Self, Oriented to  Time, Oriented to Place, Oriented  to Situation Alcohol  / Substance Use: Not Applicable Psych Involvement: No (comment)  Admission diagnosis:  Acute respiratory failure with hypoxia (HCC) [J96.01] Patient Active Problem List   Diagnosis Date Noted   Acute respiratory failure with hypoxia (HCC) 10/18/2023   Right lower lobe lung mass 10/18/2023   SIRS (systemic inflammatory response syndrome) (HCC) 10/18/2023   Renal lesion 10/18/2023   Diarrhea 10/18/2023   Microcytic anemia 10/18/2023   Chronic kidney disease, stage III (moderate) (HCC) 10/18/2023   History of squamous cell carcinoma 10/18/2023   GERD (gastroesophageal reflux disease) 10/18/2023   GI bleed 07/10/2018   Stroke (HCC)    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 [place] [address] Phone: [phone] Fax: [phone]     Social Drivers of Health (SDOH) Social History: SDOH Screenings   Food Insecurity: No Food Insecurity (10/18/2023)  Housing: Low Risk  (10/18/2023)  Transportation Needs: No Transportation Needs (10/18/2023)  Utilities: Not At Risk (10/18/2023)  Social Connections: Socially Isolated (10/18/2023)  Tobacco Use: Medium Risk (10/18/2023)  SDOH Interventions: Social Connections Interventions: Inpatient TOC, Other (Comment) (From ILF)   Readmission Risk Interventions     No data to display

## 2023-10-19 NOTE — Procedures (Addendum)
 Bedside Bronchoscopy Procedure Note [name] [id number] [birth year]  Procedure: Bronchoscopy Indications: Diagnostic evaluation of the airways and Obtain specimens for culture and/or other diagnostic studies  Procedure Details: ET Tube Size: 7.5 ET Tube secured at lip (cm): 23 Bite block in place: No In preparation for procedure, Patient hyper-oxygenated with 100 % FiO2 and Saline given via ETT (30 ml) Airway entered and the following bronchi were examined: RUL, RML, RLL, LUL, LLL, and Bronchi.   Bronchoscope removed. Patient placed back on 100% FiO2 at conclusion of procedure.    Evaluation BP (!) 154/73   Pulse 84   Temp 97.6 F (36.4 C) (Oral)   Resp (!) 42   Ht 5' (1.524 m)   Wt 56.7 kg   SpO2 99%   BMI 24.41 kg/m  Breath Sounds:Rhonch O2 sats: stable throughout Patient's Current Condition: stable Specimens:  BAL Complications: No apparent complications Patient did tolerate procedure well.   Tish Eva Lenis 10/19/2023, 5:34 PM

## 2023-10-19 NOTE — Progress Notes (Signed)
 PHARMACY - ANTICOAGULATION CONSULT NOTE  Pharmacy Consult for Heparin infusion Indication: atrial fibrillation  Allergies  Allergen Reactions   Codeine Itching and Other (See Comments)    Caused mouth to break out in hives   Neosporin [Neomycin-Bacitracin Zn-Polymyx] Itching and Swelling    Swelling at application site   Percocet [Oxycodone -Acetaminophen ] Other (See Comments)    B-blockers, triggers depression   Tussionex Pennkinetic Er [Hydrocod Poli-Chlorphe Poli Er] Itching    Patient Measurements: Height: 5' (152.4 cm) Weight: 56.7 kg (125 lb) IBW/kg (Calculated) : 45.5 HEPARIN DW (KG): 56.7  Vital Signs: Temp: 97.7 F (36.5 C) (10/02 0727) Temp Source: Oral (10/02 0727) BP: 131/63 (10/02 0727) Pulse Rate: 88 (10/02 0600)  Labs: Recent Labs    10/18/23 0937 10/18/23 0959 10/18/23 2203 10/19/23 0001 10/19/23 0428 10/19/23 0744  HGB 9.4* 10.2*  --   --  8.9*  --   HCT 30.1* 30.0*  --   --  27.6*  --   PLT 473*  --   --   --  394  --   HEPARINUNFRC  --   --   --   --   --  0.47  CREATININE 1.48*  --   --   --  1.19*  --   TROPONINIHS  --   --  43* 46*  --   --     Estimated Creatinine Clearance: 30.3 mL/min (A) (by C-G formula based on SCr of 1.19 mg/dL (H)).   Medical History: Past Medical History:  Diagnosis Date   Asthma    Cancer (HCC)    Depression    Hypertension    Peripheral vascular disease    Pneumonia    Sleep apnea    Stroke (HCC)     Medications:  Medications Prior to Admission  Medication Sig Dispense Refill Last Dose/Taking   amLODipine  (NORVASC ) 10 MG tablet Take 1 tablet (10 mg total) by mouth daily.   10/17/2023   atorvastatin  (LIPITOR) 10 MG tablet Take 10 mg by mouth daily.   3 10/17/2023   clopidogrel  (PLAVIX ) 75 MG tablet Take 1 tablet (75 mg total) by mouth daily. 30 tablet 1 10/17/2023   escitalopram  (LEXAPRO ) 20 MG tablet Take 20 mg by mouth daily.   5 10/17/2023   ferrous sulfate  325 (65 FE) MG tablet Take 1 tablet (325 mg  total) by mouth daily with breakfast. (Patient taking differently: Take 325 mg by mouth every other day. Monday, Wednesday, Friday) 90 tablet 1 10/17/2023   losartan  (COZAAR ) 25 MG tablet Take 25 mg by mouth daily.   10/17/2023   pantoprazole  (PROTONIX ) 40 MG tablet Take 1 tablet (40 mg total) by mouth daily. 90 tablet 1 10/17/2023   vitamin E 180 MG (400 UNITS) capsule Take 400 Units by mouth daily.   10/17/2023    Assessment: 78 YOF. New onset Afib. Not on anticoagulation at home. Heparin level therapeutic   Goal of Therapy:  Heparin level 0.3-0.7 units/ml Monitor platelets by anticoagulation protocol: Yes   Plan:  Continue heparin at 700 units / hr Follow up AM heparin level, CBC  Thank you. Olam Monte, PharmD 10/19/2023  9:07 AM **Pharmacist phone directory can now be found on [email or web address] ([email or web address]).  Listed under [facility].

## 2023-10-19 NOTE — Progress Notes (Signed)
 Initial Nutrition Assessment  DOCUMENTATION CODES:   Severe malnutrition in context of acute illness/injury  INTERVENTION:  If indicated, initiate tube feeding via OGT: Osmolite 1.2 at 60 ml/h (1440 ml per day) Provides 1728 kcal, 80 gm protein, 1181 ml free water daily   Collect new weight to establish trend as reported weight and actual weight inconsistent as well as significant gap in weight hx  If oral diet re-initiated: Add Magic cup TID with meals, each supplement provides 290 kcal and 9 grams of protein  Add MVI w/ minerals  NUTRITION DIAGNOSIS:  Severe Malnutrition related to acute illness (PNA?) as evidenced by moderate fat depletion, moderate muscle depletion.  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 Presented with breathing issues and diarrhea and found to have acute respiratory failure w/ hypoxia. Right lower lung mass found on CT with concern for cavitary pneumonia or malignancy.   She is sitting up in bed at time of assessment. Rapid response d/t respiratory distress called prior to RD arrival.  Breathing is extremely labored. Critical care in to assess during visit and decision made to transfer to ICU for intubation and bronchoscopy. Information below collected prior to transfer and MD arrival.   Average Meal Intake No documented intake to review  She is a resident at [facility] where meals are provided. Reports consuming three meals per day, although they are lighter in nature. Does not prefer Ensure or Boost because they decrease her appetite. Does like  chocolate Boost when indicated, although not willing to receive ONS here. Will take [employer], however.   24 Hour Recall B: toast or cereal w/ coffee L: soup/salad or sandwich with tea/lemonade or soda  D: soup/salad or sandwich with tea/lemonade or soda  No documented intake to review thus far this admission, but nurse tech reports she consumed approximately 50% of her breakfast which consisted of scrambled eggs and coffee. Reports adequate/baseline intake and appetite PTA.   Admit/Current Weight: 56.7 kg - ? accuracy  Patient reports UBW around 130-140lbs one year ago. Has been around 110lbs more recently. She is currently 124lbs based on most recent weight. Will monitor trend. Per chart review, no recent weight to trend. Current weight is the heaviest she has been since 2018, which suspect may be inaccurate. Will request new weight to assess trend and better estimate her needs. No edema on exam. She reports bowels are stable and move once per day at baseline.   Meds: Solu-Medrol, pantoprazole , KCl, IV ABX  Labs: Na+ 133 (L) K+ 2.9>3.9 (wdl) PHOS 4.1 (wdl) Mg 2.2 (wdl) Crt 1.48>1.19>1.27 (H) CRP 44.3 (10/1) CBGs 139-152 x24 hours   NUTRITION - FOCUSED PHYSICAL EXAM: While she meets criteria for malnutrition in the context of acute illness, suspect she has some underlying chronic contributors that are significant. Given that work up is ongoing around her admission dx, will be better able to assess upon further work up.   Flowsheet Row Most Recent Value  Orbital Region Moderate depletion  Upper Arm Region Moderate depletion  Thoracic and Lumbar Region Severe depletion  Buccal Region Mild depletion  Temple Region Mild depletion  Clavicle Bone Region Moderate depletion  Clavicle and Acromion Bone Region Moderate depletion  Scapular Bone Region Mild depletion  Dorsal Hand Severe depletion  Patellar Region Unable to assess  Anterior Thigh Region Moderate depletion  Posterior Calf  Region Mild depletion  Edema (RD Assessment) None  Hair Reviewed  Eyes Reviewed  Mouth Reviewed  Skin Reviewed  Nails Reviewed    Diet Order:   Diet Order             Diet Heart Room service appropriate? Yes; Fluid consistency: Thin  Diet effective now            EDUCATION NEEDS:   Not appropriate for education at this time  Skin:  Skin Assessment: Reviewed RN Assessment  Last BM:  10/2 - type 5 x1  Height:  Ht Readings from Last 1 Encounters:  10/19/23 5' (1.524 m)   Weight:  Wt Readings from Last 1 Encounters:  10/18/23 56.7 kg   Ideal Body Weight:  45.5 kg  BMI:  Body mass index is 24.41 kg/m.  Estimated Nutritional Needs:   Kcal:  1500-1700 kcals  Protein:  75-90g  Fluid:  1.4-1.6L/day  Blair Deaner MS, RD, LDN Registered Dietitian Clinical Nutrition RD Inpatient Contact Info in Amion

## 2023-10-19 NOTE — Progress Notes (Signed)
 PROGRESS NOTE    [name]  FMW:[id number] DOB: 1945/12/23 DOA: 10/18/2023 PCP: Onita Rush, MD    Chief Complaint  Patient presents with   Shortness of Breath    Brief Narrative:   [name] is a 78 y.o. 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  In the emergency department patient was noted to be afebrile with heart rates elevated up to 106, respirations 17-34, blood pressures maintained, and O2 saturations currently maintained on6 L of nasal cannula oxygen.  abs noted WBC elevated 18.2 hemoglobin 9.4, platelets 473, potassium 3.3, BUN 39, creatinine 1.48, glucose 125, and anion gap 19.Chest x-ray initially noted concern for large cavitary lesion in the right lower lobe.  CT angiogram of the chest noted concerns for extensive cavitary lesion involving the entire right lower lobe and extending into the right middle lobe and basilar aspect of the right upper lobe.  CT also made note of lobulated contour of the superior and lateral aspect of the right kidney which was  not cystic and measured up to approximately 3.8 cm making it suspicious for potential solid right renal mass. Patient had been given 2 L of lactated Ringer 's, Solu-Medrol 125 mg IV, DuoNeb breathing treatments, vancomycin , and cefepime.  Overnight she developed A-fib with RVR  Assessment & Plan:   Principal Problem:   Acute respiratory failure with hypoxia (HCC) Active Problems:   Right lower lobe lung mass   SIRS (systemic inflammatory response syndrome) (HCC)   Renal lesion   Diarrhea   Essential hypertension   Microcytic anemia   Chronic kidney disease, stage III (moderate) (HCC)   History of CVA (cerebrovascular accident)   History of squamous cell  carcinoma   History of tobacco abuse   GERD (gastroesophageal reflux disease)  Acute respiratory failure with hypoxia Right lung mass Postobstructive pneumonia - Imaging showed concern for right lower lobe cavitary concerning for infection versus malignancy given prior history of squamous cell carcinoma.  Patient had been started on empiric antibiotics of vancomycin  and cefepime. - She remains on 8 L oxygen -Encouraged use incentive spirometry and flutter valve. -Continue with scheduled DuoNebs and as needed albuterol  - Albuterol , Brovana, and hypertonic saline nebs - Continue vancomycin  and cefepime.  De-escalate when deemed medically appropriate - Appreciate PCCM consultative services, need bronchoscopy for tissue diagnosis, but respiratory status is very tenuous for now, rhyming is not clear yet .   Abscess, POA, due to postobstructive pneumonia Patient was noted to be tachycardic and tachypneic with white blood cell count elevated . -2 above - Follow-up blood cultures - Check sputum culture if able - Antibiotics as noted above   Renal lesion CT note of lobulated contour of the superior and lateral aspect of the right kidney which was  not cystic and measured up to approximately 3.8 cm making it suspicious for potential solid right renal mass. - Consider dedicated renal CT with and without contrast/ MRI for further evaluation once able   Diarrhea Acute.  Patient reports having mild diarrhea over the last day. - Monitor intake and output   Essential hypertension - Continue home blood pressure regimen  Accessible A-fib with RVR -Developed overnight. - Elevated CHA2DS2-VASc score, started on heparin drip. - On Cardizem drip initially, weaned off, started on low-dose metoprolol. - The  echo is pending -TSH is low.  Will recheck with free T4, if diagnostic of hyperthyroidism will start on methimazole -Correct hypokalemia  Severe hypokalemia -Corrected, monitor on telemetry,  recheck in the afternoon   Microcytic anemia Hemoglobin noted to be 9.4 which appears around patient's baseline with low MCV and MCH - Check B12 and folic acid   Chronic kidney disease stage IIIb Creatinine noted to be 1.48 with BUN 39.  Patient had been given 2 L of lactated Ringer 's while in the ED.   History of CVA History of right PCA stroke back in 04/2018 - Hold Plavix  for possible need of procedure - Continue statin -Started on aspirin  instead   History of squamous cell carcinoma of the tongue Patient s/p glossectomy 06/2016   History of tobacco abuse Patient reports smoking for over 50 years approximately 2 packs/day on average.   GERD - Continue PPI  DVT prophylaxis: (Heparin drip Code Status: (Ful) Family Communication: (None at bedside Disposition:   Status is: Inpatient    Consultants:  PCCM Subjective:  A-fib with RVR, reports dyspnea, cough and congestion  Objective: Vitals:   10/19/23 0600 10/19/23 0727 10/19/23 0758 10/19/23 1141  BP: (!) 160/68 131/63  117/63  Pulse: 88     Resp:  18  18  Temp:  97.7 F (36.5 C)  97.6 F (36.4 C)  TempSrc:  Oral  Oral  SpO2: 93%  92% 91%  Weight:      Height:        Intake/Output Summary (Last 24 hours) at 10/19/2023 1413 Last data filed at 10/19/2023 0310 Gross per 24 hour  Intake 124.54 ml  Output --  Net 124.54 ml   Filed Weights   10/18/23 0942  Weight: 56.7 kg    Examination:  Awake Alert, Oriented X 3, No new F.N deficits, extremely frail frail, chronically ill-appearing Symmetrical Chest wall movement,, congested with wheezing Tachycardic +ve B.Sounds, Abd Soft, No tenderness, No rebound - guarding or rigidity. No Cyanosis, Clubbing or edema, No new Rash or bruise      Data Reviewed: I have personally reviewed following labs and imaging studies  CBC: Recent Labs  Lab 10/18/23 0937 10/18/23 0959 10/19/23 0428  WBC 18.2*  --  17.1*  NEUTROABS 14.1*  --   --   HGB 9.4* 10.2* 8.9*   HCT 30.1* 30.0* 27.6*  MCV 79.0*  --  77.7*  PLT 473*  --  394    Basic Metabolic Panel: Recent Labs  Lab 10/18/23 0937 10/18/23 0959 10/19/23 0425 10/19/23 0428  NA 136 137  --  134*  K 3.3* 3.0*  --  2.9*  CL 101  --   --  101  CO2 16*  --   --  19*  GLUCOSE 125*  --   --  152*  BUN 39*  --   --  33*  CREATININE 1.48*  --   --  1.19*  CALCIUM  7.8*  --   --  7.9*  MG  --   --  2.0  --     GFR: Estimated Creatinine Clearance: 30.3 mL/min (A) (by C-G formula based on SCr of 1.19 mg/dL (H)). Liver Function Tests: Recent Labs  Lab 10/18/23 0937 10/19/23 0428  AST 26 24  ALT 13 14  ALKPHOS 107 103  BILITOT 1.3* 0.8  PROT 6.5 5.8*  ALBUMIN 2.1* 1.9*    CBG: No results for input(s): GLUCAP in the last 168 hours.   Recent Results (from the  past 240 hours)  Culture, blood (Routine x 2)     Status: None (Preliminary result)   Collection Time: 10/18/23  9:46 AM   Specimen: BLOOD RIGHT ARM  Result Value Ref Range Status   Specimen Description BLOOD RIGHT ARM  Final   Special Requests   Final    BOTTLES DRAWN AEROBIC AND ANAEROBIC Blood Culture adequate volume   Culture   Final    NO GROWTH < 24 HOURS Performed at [facility], [address] [place], [place] [address]    Report Status PENDING  Incomplete  Culture, blood (Routine x 2)     Status: None (Preliminary result)   Collection Time: 10/18/23  9:51 AM   Specimen: BLOOD RIGHT FOREARM  Result Value Ref Range Status   Specimen Description BLOOD RIGHT FOREARM  Final   Special Requests   Final    BOTTLES DRAWN AEROBIC AND ANAEROBIC Blood Culture adequate volume   Culture   Final    NO GROWTH < 24 HOURS Performed at [facility], [address] [place], [place] [address]    Report Status PENDING  Incomplete  Resp panel by RT-PCR (RSV, Flu A&B, Covid) Anterior Nasal Swab     Status: None   Collection Time: 10/18/23 10:02 AM   Specimen: Anterior Nasal Swab  Result Value Ref Range Status    SARS Coronavirus 2 by RT PCR NEGATIVE NEGATIVE Final   Influenza A by PCR NEGATIVE NEGATIVE Final   Influenza B by PCR NEGATIVE NEGATIVE Final    Comment: (NOTE) The Xpert Xpress SARS-CoV-2/FLU/RSV plus assay is intended as an aid in the diagnosis of influenza from Nasopharyngeal swab specimens and should not be used as a sole basis for treatment. Nasal washings and aspirates are unacceptable for Xpert Xpress SARS-CoV-2/FLU/RSV testing.  Fact Sheet for Patients: BloggerCourse.com  Fact Sheet for Healthcare Providers: SeriousBroker.it  This test is not yet approved or cleared by the [place]  FDA and has been authorized for detection and/or diagnosis of SARS-CoV-2 by FDA under an Emergency Use Authorization (EUA). This EUA will remain in effect (meaning this test can be used) for the duration of the COVID-19 declaration under Section 564(b)(1) of the Act, 21 U.S.C. section 360bbb-3(b)(1), unless the authorization is terminated or revoked.     Resp Syncytial Virus by PCR NEGATIVE NEGATIVE Final    Comment: (NOTE) Fact Sheet for Patients: BloggerCourse.com  Fact Sheet for Healthcare Providers: SeriousBroker.it  This test is not yet approved or cleared by the [place]  FDA and has been authorized for detection and/or diagnosis of SARS-CoV-2 by FDA under an Emergency Use Authorization (EUA). This EUA will remain in effect (meaning this test can be used) for the duration of the COVID-19 declaration under Section 564(b)(1) of the Act, 21 U.S.C. section 360bbb-3(b)(1), unless the authorization is terminated or revoked.  Performed at [facility], [address], [place], [place] [address]          Radiology Studies: DG CHEST PORT 1 VIEW Result Date: 10/18/2023 CLINICAL DATA:  Shortness of breath EXAM: PORTABLE CHEST 1 VIEW COMPARISON:  10/18/2023 FINDINGS: Single  frontal view of the chest demonstrates a stable cardiac silhouette. Large area of cavitating consolidation within the right lower lobe as seen on prior study is again identified. Left chest is clear. No large effusion or pneumothorax. No acute bony abnormalities. IMPRESSION: 1. Large area of cavitating consolidation within the right lower lobe, which may reflect infection or neoplasm. No significant  change since earlier exam. Electronically Signed   By: Ozell Daring M.D.   On: 10/18/2023 20:01   CT Angio Chest PE W and/or Wo Contrast Result Date: 10/18/2023 CLINICAL DATA:  Shortness of breath, hypoxia, cough and tachypnea. History squamous carcinoma of the tongue. Abnormal chest x-ray today demonstrating cavitary mass of the right lower lung. EXAM: CT ANGIOGRAPHY CHEST WITH CONTRAST TECHNIQUE: Multidetector CT imaging of the chest was performed using the standard protocol during bolus administration of intravenous contrast. Multiplanar CT image reconstructions and MIPs were obtained to evaluate the vascular anatomy. RADIATION DOSE REDUCTION: This exam was performed according to the departmental dose-optimization program which includes automated exposure control, adjustment of the mA and/or kV according to patient size and/or use of iterative reconstruction technique. CONTRAST:  60mL OMNIPAQUE  IOHEXOL  350 MG/ML SOLN COMPARISON:  Chest x-ray today.  Prior CT chest 03/18/2016 FINDINGS: Cardiovascular: The pulmonary arteries are adequately opacified. There is no evidence of pulmonary embolism. Central pulmonary arteries are of normal caliber. The thoracic aorta is normal in caliber. No significant aortic atherosclerosis. The heart size is normal. No pericardial fluid identified. No visualized calcified coronary artery plaque. Mediastinum/Nodes: Confluent soft tissue in the subcarinal space likely representing lymphadenopathy or contiguous tumor from the hilum or lung. Abnormal soft tissue throughout the right hilum  also represents either contiguous tumor or lymph node enlargement. Lungs/Pleura: Extensive pulmonary abnormality involving the entire right lower lobe and extending into the right middle lobe and basilar aspect of the right upper lobe across the major fissure consists of mass-like consolidation of the basilar aspect of the right lower lobe with multiple airway occlusions including endobronchial tumor/debris extending into the bronchus intermedius and large cavitary component in the mid to upper aspect of the right lower lobe measuring up to approximately 8.5 cm in diameter and demonstrating thick irregular internal walls and internal cavitation communicating directly with lower lobe bronchi. There also is abnormal soft tissue in a bronchovascular distribution within the lingula extending to the subpleural surface. While pneumonia is in the differential, conglomeration of findings including the abnormal soft tissue in the right hilum and subcarinal space is most concerning for extensive pulmonary malignancy. Recommend eventual correlation bronchoscopy. No associated pleural fluid or pneumothorax. Upper Abdomen: On the relative arterial phase, the liver is not optimally evaluated for lesions but no discrete liver lesions are identified. There is lobulated contour of the superior and lateral aspect of the right kidney which is not cystic and measures up to approximately 3.8 cm in estimated diameter. This is suspicious for a potential solid right renal mass. Musculoskeletal: No chest wall abnormality. No acute or significant osseous findings. Review of the MIP images confirms the above findings. IMPRESSION: 1. No evidence of pulmonary embolism. 2. Extensive pulmonary abnormality involving the entire right lower lobe and extending into the right middle lobe and basilar aspect of the right upper lobe across the major fissure. This consists of mass-like consolidation of the basilar aspect of the right lower lobe with  multiple airway occlusions including endobronchial tumor/debris extending into the bronchus intermedius and large cavitary component in the mid to upper aspect of the right lower lobe measuring up to approximately 8.5 cm in diameter and demonstrating thick irregular internal walls and internal cavitation communicating directly with lower lobe bronchi. There also is abnormal soft tissue in a bronchovascular distribution within the lingula extending to the subpleural surface. While pneumonia is in the differential, conglomeration of findings including the abnormal soft tissue in the right hilum and subcarinal  space is most concerning for extensive pulmonary malignancy. Recommend eventual correlation bronchoscopy. 3. Lobulated contour of the superior and lateral aspect of the right kidney which is not cystic and measures up to approximately 3.8 cm in estimated diameter. This is suspicious for a potential solid right renal mass. Recommend further evaluation with either renal mass protocol CT or MRI of the abdomen with and without contrast when the patient is able to tolerate further examination and/or additional contrast load. Electronically Signed   By: Marcey Moan M.D.   On: 10/18/2023 13:19   DG Chest Port 1 View if patient is in a treatment room. Result Date: 10/18/2023 CLINICAL DATA:  Shortness of breath.  Possible sepsis. EXAM: PORTABLE CHEST 1 VIEW COMPARISON:  March 18, 2016. FINDINGS: The heart size and mediastinal contours are within normal limits. Large cavitary lesion is seen in right lower lobe concerning for cavitary pneumonia or neoplasm. Right basilar pneumonia or atelectasis is noted with possible effusion. Left lung is unremarkable. The visualized skeletal structures are unremarkable. IMPRESSION: Large cavitary lesion seen in right lower lobe concerning for cavitary pneumonia or neoplasm. CT scan of the chest with intravenous contrast is recommended for further evaluation. Electronically Signed    By: Lynwood Landy Raddle M.D.   On: 10/18/2023 10:20        Scheduled Meds:  arformoterol  15 mcg Nebulization BID   aspirin  EC  81 mg Oral Daily   atorvastatin   10 mg Oral Daily   escitalopram   20 mg Oral Daily   guaiFENesin  600 mg Oral BID   metoprolol tartrate  25 mg Oral BID   pantoprazole   40 mg Oral Daily   potassium chloride   20 mEq Oral Once   sodium chloride  flush  3 mL Intravenous Q12H   Continuous Infusions:  diltiazem (CARDIZEM) infusion 5 mg/hr (10/19/23 1023)   heparin 700 Units/hr (10/19/23 0310)   piperacillin-tazobactam 3.375 g (10/19/23 0523)   [START ON 10/20/2023] vancomycin        LOS: 1 day      Brayton Lye, MD [facility]   To contact the attending provider between 7A-7P or the covering provider during after hours 7P-7A, please log into the web site [email or web address] and access using universal [place] password for that web site. If you do not have the password, please call the hospital operator.  10/19/2023, 2:13 PM

## 2023-10-19 NOTE — Progress Notes (Addendum)
 Notify E-Link of critical results: Critical ABG results  pH 7.21 pCO2 54.7 pO2 140 HCO3 21.9

## 2023-10-19 NOTE — Procedures (Signed)
 Bronchoscopy Procedure Note  [name]  [id number]  [birth year]  Date:10/19/23  Time:5:13 PM   Provider Performing:Mert Dietrick [name]   Procedure(s):  Flexible bronchoscopy with bronchial alveolar lavage [phone])  Indication(s) Acute hypoxic respiratory failure, right cavitary lung lesion  Consent Risks of the procedure as well as the alternatives and risks of each were explained to the patient and/or caregiver.  Consent for the procedure was obtained and is signed in the bedside chart  Anesthesia Performed following intubation for which patient received 50 mg fentanyl , 20 mg etomidate and 80 mg rocuronium    Time Out Verified patient identification, verified procedure, site/side was marked, verified correct patient position, special equipment/implants available, medications/allergies/relevant history reviewed, required imaging and test results available.   Sterile Technique Usual hand hygiene, masks, gowns, and gloves were used   Procedure Description Bronchoscope advanced through endotracheal tube and into airway.  Airways were examined down to subsegmental level with findings noted below.   Following diagnostic evaluation, BAL(s) performed in right bronchus intermedius with normal saline and return of blood tinged fluid with some small tissue particles  Findings: Bronchus intermedius partially obstructed by friable tissue concerning for malignancy    Complications/Tolerance None; patient tolerated the procedure well. Chest X-ray is needed post procedure.   EBL Minimal   Specimen(s) BAL of right bronchus intermedius   Melissa Melissa Blush, PA-C [facility] 10/19/23 5:17 PM  Please see [email or web address] for pager details.  From 7A-7P if no response, please call [phone] After hours, please call ELink [phone]

## 2023-10-19 NOTE — Plan of Care (Signed)

## 2023-10-19 NOTE — Progress Notes (Signed)
 Patient family updated, updated about transfer to ICU, and the possible need for intubation, discussed with her brother and sister-in-law in [place]  Mr Mennie, as well discussed with her best  friend Ms. Register(patient reported that Ms Register is her point of contact) Brayton Lye MD

## 2023-10-19 NOTE — Evaluation (Signed)
 Clinical/Bedside Swallow Evaluation Patient Details  Name: [name] MRN: [id number] Date of Birth: [birth year]  Today's Date: 10/19/2023 Time: SLP Start Time (ACUTE ONLY): 1107 SLP Stop Time (ACUTE ONLY): 1127 SLP Time Calculation (min) (ACUTE ONLY): 20 min  Past Medical History:  Past Medical History:  Diagnosis Date   Asthma    Cancer (HCC)    Depression    Hypertension    Peripheral vascular disease    Pneumonia    Sleep apnea    Stroke [facility])    Past Surgical History:  Past Surgical History:  Procedure Laterality Date   BIOPSY  08/16/2021   Procedure: BIOPSY;  Surgeon: Elicia Claw, MD;  Location: [facility] ENDOSCOPY;  Service: Gastroenterology;;   COLONOSCOPY WITH PROPOFOL  N/A 08/16/2021   Procedure: COLONOSCOPY WITH PROPOFOL ;  Surgeon: Elicia Claw, MD;  Location: [facility] ENDOSCOPY;  Service: Gastroenterology;  Laterality: N/A;   DIRECT LARYNGOSCOPY Left 06/29/2016   Procedure: WIDE EXCISION LEFT TONGUE CANCER DIRECT LARYNGOSCOPY;  Surgeon: Arlana Arnt, MD;  Location: [facility] OR;  Service: ENT;  Laterality: Left;   ESOPHAGOGASTRODUODENOSCOPY (EGD) WITH PROPOFOL  Left 07/12/2018   Procedure: ESOPHAGOGASTRODUODENOSCOPY (EGD) WITH PROPOFOL ;  Surgeon: Dianna Specking, MD;  Location: [facility] ENDOSCOPY;  Service: Endoscopy;  Laterality: Left;   ESOPHAGOGASTRODUODENOSCOPY (EGD) WITH PROPOFOL  N/A 08/16/2021   Procedure: ESOPHAGOGASTRODUODENOSCOPY (EGD) WITH PROPOFOL ;  Surgeon: Elicia Claw, MD;  Location: [facility] ENDOSCOPY;  Service: Gastroenterology;  Laterality: N/A;   ESOPHAGOSCOPY N/A 06/29/2016   Procedure: ESOPHAGOSCOPY/BRONCHOSCOPY;  Surgeon: Arlana Arnt, MD;  Location[facility] OR;  Service: ENT;  Laterality: N/A;   EYE SURGERY Bilateral 2016   HEMOSTASIS CLIP PLACEMENT  08/16/2021   Procedure: HEMOSTASIS CLIP PLACEMENT;  Surgeon: Elicia Claw, MD;  Location: [facility] ENDOSCOPY;  Service: Gastroenterology;;   HOT HEMOSTASIS N/A 07/12/2018   Procedure: HOT HEMOSTASIS (ARGON PLASMA  COAGULATION/BICAP);  Surgeon: Dianna Specking, MD;  Location: [facility] ENDOSCOPY;  Service: Endoscopy;  Laterality: N/A;   HOT HEMOSTASIS N/A 08/16/2021   Procedure: HOT HEMOSTASIS (ARGON PLASMA COAGULATION/BICAP);  Surgeon: Elicia Claw, MD;  Location: [facility] ENDOSCOPY;  Service: Gastroenterology;  Laterality: N/A;   KNEE SURGERY Right 2014   LOOP RECORDER INSERTION N/A 03/21/2016   Procedure: Loop Recorder Insertion;  Surgeon: Will Gladis Norton, MD;  Location: [facility] INVASIVE CV LAB;  Service: Cardiovascular;  Laterality: N/A;   PARS PLANA VITRECTOMY Right 02/28/2014   Procedure: PARS PLANA VITRECTOMY 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 WITH TAP AND ANTIBIOTIC INJECTION;  Surgeon: Arley DELENA Ruder, MD;  Location: [facility] OR;  Service: Ophthalmology;  Laterality: Right;   POLYPECTOMY  08/16/2021   Procedure: POLYPECTOMY;  Surgeon: Elicia Claw, MD;  Location: [facility] ENDOSCOPY;  Service: Gastroenterology;;   SCLEROTHERAPY  08/16/2021   Procedure: MATIAS;  Surgeon: Elicia Claw, MD;  Location: [facility] ENDOSCOPY;  Service: Gastroenterology;;   TEE WITHOUT CARDIOVERSION N/A 03/21/2016   Procedure: TRANSESOPHAGEAL ECHOCARDIOGRAM (TEE);  Surgeon: Annabella Scarce, MD;  Location: [facility] ENDOSCOPY;  Service: Cardiovascular;  Laterality: N/A;   HPI:  78 yo presenting 10/1 with difficulty breathing and diarrhea. Admitted with acute respiratory failure and sepsis. Found to have RLL cavitary lesion concerning for infection versus malignancy. PMH includes: SCC of the tongue s/p excision (2018), CVA, GERD, HTN, HLD, PVD, anxiety, depression, GIB. Previous clinical swallow eval in 2020 suggestive of normal oropharyngeal function.    Assessment / Plan / Recommendation  Clinical Impression  Pt denies a h/o dysphagia, but reports acute onset of difficulty swallowing over the last few days. She attributes this to thrush and odynophagia, particularly with solids, but  believes that this is improving today. She has no overt signs of  aspiration during PO trials, but also only takes small bites/sips at a time. Question if some of this is related to current respiratory status, which also could increase aspiration risk. She sometimes has effortful appearing swallows or extra swallow, but overall clinical symptoms of dysphagia are low. If aspiration remains in the differential for findings on imaging, recommend proceeding with MBS to better evaluate oropharyngeal function. Discussed with pt, who declines MBS at this time, but is willing to consider. SLP will f/u on subsequent date to see if she will reconsider, and did provide education about aspiration and respiratory precautions for now.  SLP Visit Diagnosis: Dysphagia, unspecified (R13.10)    Aspiration Risk       Diet Recommendation Regular;Thin liquid    Liquid Administration via: Cup;Straw Medication Administration: Whole meds with liquid Supervision: Patient able to self feed;Intermittent supervision to cue for compensatory strategies Compensations: Slow rate;Small sips/bites;Other (Comment) (take breaks PRN for breathing) Postural Changes: Seated upright at 90 degrees    Other  Recommendations Oral Care Recommendations: Oral care QID     Assistance Recommended at Discharge    Functional Status Assessment Patient has had a recent decline in their functional status and demonstrates the ability to make significant improvements in function in a reasonable and predictable amount of time.  Frequency and Duration min 2x/week  2 weeks       Prognosis Prognosis for improved oropharyngeal function: Good      Swallow Study   General HPI: 78 yo presenting 10/1 with difficulty breathing and diarrhea. Admitted with acute respiratory failure and sepsis. Found to have RLL cavitary lesion concerning for infection versus malignancy. PMH includes: SCC of the tongue s/p excision (2018), CVA, GERD, HTN, HLD, PVD, anxiety, depression, GIB. Previous clinical swallow eval in 2020  suggestive of normal oropharyngeal function. Type of Study: Bedside Swallow Evaluation Previous Swallow Assessment: none in chart Diet Prior to this Study: Regular;Thin liquids (Level 0) Temperature Spikes Noted: No Respiratory Status: Nasal cannula (10L HFNC) History of Recent Intubation: No Behavior/Cognition: Alert;Cooperative;Pleasant mood Oral Cavity Assessment: Dry Oral Care Completed by SLP: No Oral Cavity - Dentition: Adequate natural dentition Vision: Functional for self-feeding Self-Feeding Abilities: Able to feed self Patient Positioning: Other (comment) (EOB) Baseline Vocal Quality: Other (comment) (rough) Volitional Cough: Weak Volitional Swallow: Able to elicit    Oral/Motor/Sensory Function Overall Oral Motor/Sensory Function: Generalized oral weakness   Ice Chips Ice chips: Not tested   Thin Liquid Thin Liquid: Impaired Presentation: Cup;Self Fed;Straw Pharyngeal  Phase Impairments: Multiple swallows    Nectar Thick Nectar Thick Liquid: Not tested   Honey Thick Honey Thick Liquid: Not tested   Puree Puree: Impaired Presentation: Self Fed;Spoon Pharyngeal Phase Impairments: Multiple swallows   Solid     Solid: Impaired Presentation: Self Fed Pharyngeal Phase Impairments: Multiple swallows      Leita SAILOR., M.A. CCC-SLP Acute Rehabilitation Services Office: [phone]  Secure chat preferred  10/19/2023,12:13 PM

## 2023-10-19 NOTE — Procedures (Signed)
 Intubation Procedure Note  [name]  [id number]  11/08/1945  Date:10/19/23  Time:5:12 PM   Provider Performing:Jevaeh Shams [name]    Procedure: Intubation (31500)  Indication(s) Respiratory Failure  Consent Risks of the procedure as well as the alternatives and risks of each were explained to the patient and/or caregiver.  Consent for the procedure was obtained and is signed in the bedside chart   Anesthesia Etomidate, Fentanyl , and Rocuronium    Time Out Verified patient identification, verified procedure, site/side was marked, verified correct patient position, special equipment/implants available, medications/allergies/relevant history reviewed, required imaging and test results available.   Sterile Technique Usual hand hygeine, masks, and gloves were used   Procedure Description Patient positioned in bed supine.  Sedation given as noted above.  Patient was intubated with endotracheal tube using Glidescope.  View was Grade 1 full glottis .  Number of attempts was 1.  Colorimetric CO2 detector was consistent with tracheal placement.   Complications/Tolerance None; patient tolerated the procedure well. Chest X-ray is ordered to verify placement.   EBL Minimal   Specimen(s) None  [name], [place] [facility] 10/19/23 5:12 PM  Please see [email or web address] for pager details.  From 7A-7P if no response, please call [phone] After hours, please call ELink [phone]

## 2023-10-20 ENCOUNTER — Inpatient Hospital Stay (HOSPITAL_COMMUNITY)

## 2023-10-20 DIAGNOSIS — I4891 Unspecified atrial fibrillation: Secondary | ICD-10-CM

## 2023-10-20 DIAGNOSIS — J9601 Acute respiratory failure with hypoxia: Secondary | ICD-10-CM | POA: Diagnosis not present

## 2023-10-20 DIAGNOSIS — J441 Chronic obstructive pulmonary disease with (acute) exacerbation: Secondary | ICD-10-CM | POA: Diagnosis not present

## 2023-10-20 DIAGNOSIS — E876 Hypokalemia: Secondary | ICD-10-CM

## 2023-10-20 DIAGNOSIS — Z87891 Personal history of nicotine dependence: Secondary | ICD-10-CM | POA: Diagnosis not present

## 2023-10-20 DIAGNOSIS — R918 Other nonspecific abnormal finding of lung field: Secondary | ICD-10-CM | POA: Diagnosis not present

## 2023-10-20 DIAGNOSIS — N281 Cyst of kidney, acquired: Secondary | ICD-10-CM

## 2023-10-20 DIAGNOSIS — E43 Unspecified severe protein-calorie malnutrition: Secondary | ICD-10-CM | POA: Insufficient documentation

## 2023-10-20 LAB — POCT I-STAT 7, (LYTES, BLD GAS, ICA,H+H)
Acid-base deficit: 6 mmol/L — ABNORMAL HIGH (ref 0.0–2.0)
Acid-base deficit: 6 mmol/L — ABNORMAL HIGH (ref 0.0–2.0)
Bicarbonate: 18.9 mmol/L — ABNORMAL LOW (ref 20.0–28.0)
Bicarbonate: 19.1 mmol/L — ABNORMAL LOW (ref 20.0–28.0)
Calcium, Ion: 1.07 mmol/L — ABNORMAL LOW (ref 1.15–1.40)
Calcium, Ion: 1.11 mmol/L — ABNORMAL LOW (ref 1.15–1.40)
HCT: 26 % — ABNORMAL LOW (ref 36.0–46.0)
HCT: 27 % — ABNORMAL LOW (ref 36.0–46.0)
Hemoglobin: 8.8 g/dL — ABNORMAL LOW (ref 12.0–15.0)
Hemoglobin: 9.2 g/dL — ABNORMAL LOW (ref 12.0–15.0)
O2 Saturation: 93 %
O2 Saturation: 98 %
Patient temperature: 98.2
Potassium: 3.5 mmol/L (ref 3.5–5.1)
Potassium: 3.8 mmol/L (ref 3.5–5.1)
Sodium: 133 mmol/L — ABNORMAL LOW (ref 135–145)
Sodium: 134 mmol/L — ABNORMAL LOW (ref 135–145)
TCO2: 20 mmol/L — ABNORMAL LOW (ref 22–32)
TCO2: 20 mmol/L — ABNORMAL LOW (ref 22–32)
pCO2 arterial: 34.9 mmHg (ref 32–48)
pCO2 arterial: 37.3 mmHg (ref 32–48)
pH, Arterial: 7.318 — ABNORMAL LOW (ref 7.35–7.45)
pH, Arterial: 7.341 — ABNORMAL LOW (ref 7.35–7.45)
pO2, Arterial: 105 mmHg (ref 83–108)
pO2, Arterial: 69 mmHg — ABNORMAL LOW (ref 83–108)

## 2023-10-20 LAB — BASIC METABOLIC PANEL WITH GFR
Anion gap: 16 — ABNORMAL HIGH (ref 5–15)
BUN: 40 mg/dL — ABNORMAL HIGH (ref 8–23)
CO2: 17 mmol/L — ABNORMAL LOW (ref 22–32)
Calcium: 7.9 mg/dL — ABNORMAL LOW (ref 8.9–10.3)
Chloride: 102 mmol/L (ref 98–111)
Creatinine, Ser: 1.7 mg/dL — ABNORMAL HIGH (ref 0.44–1.00)
GFR, Estimated: 30 mL/min — ABNORMAL LOW (ref >60)
Glucose, Bld: 143 mg/dL — ABNORMAL HIGH (ref 70–99)
Potassium: 3.9 mmol/L (ref 3.5–5.1)
Sodium: 135 mmol/L (ref 135–145)

## 2023-10-20 LAB — MAGNESIUM: Magnesium: 2 mg/dL (ref 1.7–2.4)

## 2023-10-20 LAB — CBC
HCT: 27.6 % — ABNORMAL LOW (ref 36.0–46.0)
Hemoglobin: 8.5 g/dL — ABNORMAL LOW (ref 12.0–15.0)
MCH: 24.8 pg — ABNORMAL LOW (ref 26.0–34.0)
MCHC: 30.8 g/dL (ref 30.0–36.0)
MCV: 80.5 fL (ref 80.0–100.0)
Platelets: 450 K/uL — ABNORMAL HIGH (ref 150–400)
RBC: 3.43 MIL/uL — ABNORMAL LOW (ref 3.87–5.11)
RDW: 16.2 % — ABNORMAL HIGH (ref 11.5–15.5)
WBC: 19.2 K/uL — ABNORMAL HIGH (ref 4.0–10.5)
nRBC: 0 % (ref 0.0–0.2)

## 2023-10-20 LAB — GLUCOSE, CAPILLARY
Glucose-Capillary: 145 mg/dL — ABNORMAL HIGH (ref 70–99)
Glucose-Capillary: 136 mg/dL — ABNORMAL HIGH (ref 70–99)
Glucose-Capillary: 154 mg/dL — ABNORMAL HIGH (ref 70–99)
Glucose-Capillary: 180 mg/dL — ABNORMAL HIGH (ref 70–99)
Glucose-Capillary: 161 mg/dL — ABNORMAL HIGH (ref 70–99)
Glucose-Capillary: 162 mg/dL — ABNORMAL HIGH (ref 70–99)

## 2023-10-20 LAB — TRIGLYCERIDES: Triglycerides: 121 mg/dL (ref <150)

## 2023-10-20 LAB — HEMOGLOBIN A1C
Hgb A1c MFr Bld: 6.4 % — ABNORMAL HIGH (ref 4.8–5.6)
Mean Plasma Glucose: 136.98 mg/dL

## 2023-10-20 LAB — HEPARIN LEVEL (UNFRACTIONATED)
Heparin Unfractionated: 0.24 [IU]/mL — ABNORMAL LOW (ref 0.30–0.70)
Heparin Unfractionated: 0.36 [IU]/mL (ref 0.30–0.70)

## 2023-10-20 LAB — PHOSPHORUS: Phosphorus: 4.9 mg/dL — ABNORMAL HIGH (ref 2.5–4.6)

## 2023-10-20 MED ORDER — INSULIN ASPART 100 UNIT/ML IJ SOLN
0.0000 [IU] | INTRAMUSCULAR | Status: DC
  Administered 2023-10-20 – 2023-10-21 (×7): 2 [IU] via SUBCUTANEOUS
  Administered 2023-10-21: 1 [IU] via SUBCUTANEOUS
  Administered 2023-10-22 (×2): 2 [IU] via SUBCUTANEOUS
  Administered 2023-10-22: 3 [IU] via SUBCUTANEOUS
  Administered 2023-10-22 (×2): 2 [IU] via SUBCUTANEOUS
  Administered 2023-10-23: 3 [IU] via SUBCUTANEOUS
  Administered 2023-10-23 (×3): 2 [IU] via SUBCUTANEOUS

## 2023-10-20 MED ORDER — OSMOLITE 1.2 CAL PO LIQD
1000.0000 mL | ORAL | Status: DC
  Administered 2023-10-20 – 2023-10-24 (×3): 1000 mL
  Filled 2023-10-20 (×7): qty 1000

## 2023-10-20 MED ORDER — VANCOMYCIN HCL 750 MG/150ML IV SOLN: 750.0000 mg | INTRAVENOUS | Status: DC

## 2023-10-20 MED ORDER — ORAL CARE MOUTH RINSE
15.0000 mL | OROMUCOSAL | Status: DC
  Administered 2023-10-20 – 2023-10-26 (×71): 15 mL via OROMUCOSAL

## 2023-10-20 MED ORDER — MIDAZOLAM HCL 2 MG/2ML IJ SOLN
2.0000 mg | INTRAMUSCULAR | Status: DC | PRN
  Administered 2023-10-20 – 2023-10-23 (×5): 2 mg via INTRAVENOUS
  Filled 2023-10-20 (×5): qty 2

## 2023-10-20 MED ORDER — BUDESONIDE 0.25 MG/2ML IN SUSP
0.2500 mg | Freq: Two times a day (BID) | RESPIRATORY_TRACT | Status: DC
  Administered 2023-10-20 – 2023-10-23 (×7): 0.25 mg via RESPIRATORY_TRACT
  Filled 2023-10-20 (×7): qty 2

## 2023-10-20 MED ORDER — PROSOURCE TF20 ENFIT COMPATIBL EN LIQD
60.0000 mL | Freq: Every day | ENTERAL | Status: DC
  Administered 2023-10-20 – 2023-10-26 (×7): 60 mL
  Filled 2023-10-20 (×7): qty 60

## 2023-10-20 MED ORDER — ORAL CARE MOUTH RINSE: 15.0000 mL | OROMUCOSAL | Status: DC | PRN

## 2023-10-20 NOTE — Progress Notes (Signed)
 PHARMACY - ANTICOAGULATION CONSULT NOTE  Pharmacy Consult for Heparin infusion Indication: atrial fibrillation  Allergies  Allergen Reactions   Codeine Itching and Other (See Comments)    Caused mouth to break out in hives   Neosporin [Neomycin-Bacitracin Zn-Polymyx] Itching and Swelling    Swelling at application site   Percocet [Oxycodone -Acetaminophen ] Other (See Comments)    B-blockers, triggers depression   Tussionex Pennkinetic Er [Hydrocod Poli-Chlorphe Poli Er] Itching    Patient Measurements: Height: 5' (152.4 cm) Weight: 56.7 kg (125 lb) IBW/kg (Calculated) : 45.5 HEPARIN DW (KG): 56.7  Vital Signs: Temp: 98.2 F (36.8 C) (10/03 0734) Temp Source: Oral (10/03 0734) BP: 110/54 (10/03 0800) Pulse Rate: 72 (10/03 0800)  Labs: Recent Labs    10/18/23 0937 10/18/23 0959 10/18/23 2203 10/19/23 0001 10/19/23 0428 10/19/23 0744 10/19/23 1836 10/19/23 2002 10/20/23 0001 10/20/23 0412  HGB 9.4*   < >  --   --  8.9*  --    < > 10.5* 9.2* 8.5*  HCT 30.1*   < >  --   --  27.6*  --    < > 31.0* 27.0* 27.6*  PLT 473*  --   --   --  394  --   --   --   --  450*  HEPARINUNFRC  --   --   --   --   --  0.47  --   --   --  0.24*  CREATININE 1.48*  --   --   --  1.19* 1.27*  --   --   --  1.70*  TROPONINIHS  --   --  43* 46*  --   --   --   --   --   --    < > = values in this interval not displayed.    Estimated Creatinine Clearance: 21.2 mL/min (A) (by C-G formula based on SCr of 1.7 mg/dL (H)).   Medical History: Past Medical History:  Diagnosis Date   Asthma    Cancer (HCC)    Depression    Hypertension    Peripheral vascular disease    Pneumonia    Sleep apnea    Stroke (HCC)     Medications:  Medications Prior to Admission  Medication Sig Dispense Refill Last Dose/Taking   amLODipine  (NORVASC ) 10 MG tablet Take 1 tablet (10 mg total) by mouth daily.   10/17/2023   atorvastatin  (LIPITOR) 10 MG tablet Take 10 mg by mouth daily.   3 10/17/2023    clopidogrel  (PLAVIX ) 75 MG tablet Take 1 tablet (75 mg total) by mouth daily. 30 tablet 1 10/17/2023   escitalopram  (LEXAPRO ) 20 MG tablet Take 20 mg by mouth daily.   5 10/17/2023   ferrous sulfate  325 (65 FE) MG tablet Take 1 tablet (325 mg total) by mouth daily with breakfast. (Patient taking differently: Take 325 mg by mouth every other day. Monday, Wednesday, Friday) 90 tablet 1 10/17/2023   losartan  (COZAAR ) 25 MG tablet Take 25 mg by mouth daily.   10/17/2023   pantoprazole  (PROTONIX ) 40 MG tablet Take 1 tablet (40 mg total) by mouth daily. 90 tablet 1 10/17/2023   vitamin E 180 MG (400 UNITS) capsule Take 400 Units by mouth daily.   10/17/2023    Assessment: 78 YOF. New onset Afib. Not on anticoagulation at home. Hx of CVA in 2018. Note history of GI bleed secondary to AVMs in past.   Heparin level is sub-therapeutic at 0.24 on current  rate of 700 units/hr.  Hgb down some at 8.5. Platelets high at 450. No bleeding noted. No issues with the infusion. SCr is trending up at 1.70 with decreased CrCl at ~21 ml/min.   Goal of Therapy:  Heparin level 0.3-0.7 units/ml Monitor platelets by anticoagulation protocol: Yes   Plan:  Continue heparin at 800 units / hr Follow up AM heparin level, CBC  Harlene Boga, PharmD, BCPS, BCCCP Clinical Pharmacist 10/20/2023  8:41 AM **Pharmacist phone directory can now be found on [email or web address] ([email or web address]).  Listed under [facility].

## 2023-10-20 NOTE — Progress Notes (Signed)
 Pharmacy Antibiotic Note  [name] is a 78 y.o. female for which pharmacy has been consulted for vancomycin  and zosyn dosing for pneumonia.  Patient with a history of Emphysema, CVA, HTN, HLD, GERD, GIB, PVD, CKD, squamous cell carcinoma on tongue. Patient presenting with SOB.  WBC up 19.2 (on Solumedrol IV).  SCr up 1.70 - having some UOP/unmeasured.  Afebrile.   Plan: Continue Zosyn 3.375g IV q8h (4 hour infusion) Adjust Vancomycin  to 750 mg IV every 48 hours (AUC 450, SCr used 1.7) Monitor WBC, fever, renal function, cultures De-escalate when able Levels at steady state  Height: 5' (152.4 cm) Weight: 56.7 kg (125 lb) IBW/kg (Calculated) : 45.5  Temp (24hrs), Avg:97.9 F (36.6 C), Min:97.2 F (36.2 C), Max:98.2 F (36.8 C)  Recent Labs  Lab 10/18/23 0937 10/18/23 0959 10/19/23 0428 10/19/23 0744 10/20/23 0412  WBC 18.2*  --  17.1*  --  19.2*  CREATININE 1.48*  --  1.19* 1.27* 1.70*  LATICACIDVEN  --  1.3  --   --   --     Estimated Creatinine Clearance: 21.2 mL/min (A) (by C-G formula based on SCr of 1.7 mg/dL (H)).    Allergies  Allergen Reactions   Codeine Itching and Other (See Comments)    Caused mouth to break out in hives   Neosporin [Neomycin-Bacitracin Zn-Polymyx] Itching and Swelling    Swelling at application site   Percocet [Oxycodone -Acetaminophen ] Other (See Comments)    B-blockers, triggers depression   Tussionex Pennkinetic Er [Hydrocod Poli-Chlorphe Poli Er] Itching   Microbiology results: Pending  Thank you for allowing pharmacy to be a part of this patient's care.  Harlene Boga, PharmD, BCPS, BCCCP Clinical Pharmacist Please refer to [facility] for [facility]acy numbers 10/20/2023 1:07 PM

## 2023-10-20 NOTE — Plan of Care (Signed)
   Problem: Clinical Measurements: Goal: Ability to maintain clinical measurements within normal limits will improve Outcome: Progressing   Problem: Activity: Goal: Risk for activity intolerance will decrease Outcome: Progressing

## 2023-10-20 NOTE — Consult Note (Addendum)
 NAME:  [name], MRN:  [id number], DOB:  03-Mar-1945, LOS: 2 ADMISSION DATE:  10/18/2023, CONSULTATION DATE:  10/18/23 REFERRING MD:  Dr. 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  Presented from independent living facility after found to have noisy breathing and several days of progressive SOB found to be hypoxic in the 80's.  Pt states has been SOB for 3 days with new dry cough and poor PO intake related to dyspnea.  Denies fever, chills, chest pain, wt loss (although appears to have 19lb wt loss since 01/2023), hemoptysis, dysphagia or choking episodes. Does not use oxygen, nebs/ inhalers at baseline and denies OSA.   Treated in ER with O2, nebs, solumedrol.  Afebrile in ER, requiring 6L.  WBC 18.2, Hgb 9.4, sCr 1.48, ABG 7.414/ 29/ 153/ 18.6.  SARS/ flu/ RSV neg. CXR concerning for RLL caviatry lesion.  Empiric vancomycin  and zosyn started. CTA PE obtained, negative for PE, but extensive abnormality of RLL, RML and basilar aspect of RUE with mass like consolidation in RLL with multiple airway occlusions including endobronchial tumor/ debris and large 8.5 cm cavitary lesion of RLL with communication directly into lower lobe bronchi concerning for extensive pulmonary malignancy, pneumonia remains in differential.  Abnormal bronchovascular distribution within lingula extending into subpleural surface.  Also noted right renal abnormal contour suspicious for solid renal mass. Admitted to [facility] with Pulmonary consulting for further recomendations.   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 rate 16, height 5' (1.524 m), weight 56.7 kg, SpO2 95%.    Vent Mode: PRVC FiO2 (%):  [40 %-100 %] 40 % Set Rate:  [20 bmp-28 bmp] 20 bmp Vt Set:  [370 mL-450 mL] 400 mL PEEP:  [5 cmH20-10 cmH20] 5 cmH20 Plateau Pressure:  [0 cmH20-17 cmH20] 16 cmH20   Intake/Output Summary (Last 24 hours) at 10/20/2023 1236 Last data filed at 10/20/2023 1200 Gross per 24 hour  Intake 918.51 ml  Output --  Net 918.51 ml   Filed Weights   10/18/23 0942  Weight: 56.7 kg   Examination: General: Sedated and intubated elderly lady. Lungs: Wheezing bilaterally. Heart: regular rate rhythm, no murmur appreciated.  Abdomen: non tender, non distended. Normal BS.  Neuro: Sedated.   Resolved problem list   Assessment and Plan   Right lower lobe cavitary lung mass with opacity in right middle lobe and left lingula:  Former smoker and prior tongue SCC: Renal mass: Hypoxic respiratory failure: COPD exacerbation: - Lung mass suspected to be cancer.  However lung abscess could not be ruled out. - Intubated.  LT VV.  I made vent setting changes to make the patient more synchronous with the vent.  Follow blood gas normal. - Continue Vanco and Zosyn. -Solu-Medrol,  scheduled DuoNebs, budesonide nebs. - Will get dedicated CT of the abdomen to evaluate for any lesions for suspected malignancy.  A-fib: -Echo: EF normal with reduced RV function. - Low-dose metoprolol. - heparin gtt.   Elevated FT4 - started on methimazole for FT4 of 1.32. Will dc.   Severe hypokalemia: - Replace as needed.  CKD  stage IIIb: - Monitor urine output.  Foley placed for monitoring.  History of CVA: -Aspirin  and statin for now. -Plavix  was changed to aspirin  while being transferred to the ICU if need for procedures.   GERD PPI.  AKI:  -Improving.   Updated brother Norleen on 10/3 who lives in [place] .  He talks to the patient on a regular basis but he did mention they are not very close and the patient does not confide much information to him with regards to her health..   CRITICAL CARE Performed by: Sammi JONETTA Fredericks.     Total critical care time: 50 minutes   Critical care time was exclusive of separately billable procedures and treating other patients.   Critical care was necessary to treat or prevent imminent or life-threatening deterioration.   Critical care was time spent personally by me on the following activities: development of treatment plan with patient and/or surrogate as well as nursing, discussions with consultants, evaluation of patient's response to treatment, examination of patient, obtaining history from patient or surrogate, ordering and performing treatments and interventions, ordering and review of laboratory studies, ordering and review of radiographic studies, pulse oximetry, re-evaluation of patient's condition and participation in multidisciplinary rounds.  Sammi JONETTA Fredericks, MD Pulmonary, Critical Care and Sleep Attending.  Pager: [phone]  10/20/2023, 12:46 PM

## 2023-10-20 NOTE — Progress Notes (Signed)
 Nutrition Brief Note  Patient transferred to ICU post RD assessment yesterday d/t decline in respiratory status. She is now intubated on vent support.   Spoke with CCM MD who is amenable to initiation of enteral nutrition via OGT (tip/side port within the stomach).   Initiate tube feeding as below: Osmolite 1.2 at 94ml/hr ( daily) Start at 20ml/hr and advance by 10ml q8h to goal rate 60ml ProSource TF20 once daily Provides 1520 kcal, 87g protein, free water daily  Please reach out via secure chat with urgent nutrition related needs.   Allie Takiera Mayo, RDN, LDN Clinical Nutrition See AMiON for contact information.

## 2023-10-20 NOTE — Progress Notes (Signed)
 SLP Cancellation Note  Patient Details Name: [name] MRN: [id number] DOB: 1945-04-07   Cancelled treatment:       Reason Eval/Treat Not Completed: Medical issues which prohibited therapy. Pt with further decline in respiratory status on previous date, since transferred to ICU and intubated, s/p bronch. Will f/u for readiness.     Leita SAILOR., M.A. CCC-SLP Acute Rehabilitation Services Office: [phone]  Secure chat preferred  10/20/2023, 8:00 AM

## 2023-10-20 NOTE — Progress Notes (Signed)
 RT transported pt on ventilator from 2M09 to CT and back to 2M09 without any complications. RN at bedside.

## 2023-10-20 NOTE — Progress Notes (Addendum)
 PHARMACY - ANTICOAGULATION CONSULT NOTE  Pharmacy Consult for Heparin infusion Indication: atrial fibrillation  Allergies  Allergen Reactions   Codeine Itching and Other (See Comments)    Caused mouth to break out in hives   Neosporin [Neomycin-Bacitracin Zn-Polymyx] Itching and Swelling    Swelling at application site   Percocet [Oxycodone -Acetaminophen ] Other (See Comments)    B-blockers, triggers depression   Tussionex Pennkinetic Er [Hydrocod Poli-Chlorphe Poli Er] Itching    Patient Measurements: Height: 5' (152.4 cm) Weight: 56.7 kg (125 lb) IBW/kg (Calculated) : 45.5 HEPARIN DW (KG): 56.7  Vital Signs: Temp: 98 F (36.7 C) (10/03 1606) Temp Source: Oral (10/03 1606) BP: 105/50 (10/03 1700) Pulse Rate: 88 (10/03 1700)  Labs: Recent Labs    10/18/23 0937 10/18/23 0959 10/18/23 2203 10/19/23 0001 10/19/23 0428 10/19/23 0744 10/19/23 1836 10/20/23 0001 10/20/23 0412 10/20/23 1133 10/20/23 1705  HGB 9.4*   < >  --   --  8.9*  --    < > 9.2* 8.5* 8.8*  --   HCT 30.1*   < >  --   --  27.6*  --    < > 27.0* 27.6* 26.0*  --   PLT 473*  --   --   --  394  --   --   --  450*  --   --   HEPARINUNFRC  --   --   --   --   --  0.47  --   --  0.24*  --  0.36  CREATININE 1.48*  --   --   --  1.19* 1.27*  --   --  1.70*  --   --   TROPONINIHS  --   --  43* 46*  --   --   --   --   --   --   --    < > = values in this interval not displayed.    Estimated Creatinine Clearance: 21.2 mL/min (A) (by C-G formula based on SCr of 1.7 mg/dL (H)).   Medical History: Past Medical History:  Diagnosis Date   Asthma    Cancer (HCC)    Depression    Hypertension    Peripheral vascular disease    Pneumonia    Sleep apnea    Stroke (HCC)     Medications:  Medications Prior to Admission  Medication Sig Dispense Refill Last Dose/Taking   amLODipine  (NORVASC ) 10 MG tablet Take 1 tablet (10 mg total) by mouth daily.   10/17/2023   atorvastatin  (LIPITOR) 10 MG tablet Take 10 mg  by mouth daily.   3 10/17/2023   clopidogrel  (PLAVIX ) 75 MG tablet Take 1 tablet (75 mg total) by mouth daily. 30 tablet 1 10/17/2023   escitalopram  (LEXAPRO ) 20 MG tablet Take 20 mg by mouth daily.   5 10/17/2023   ferrous sulfate  325 (65 FE) MG tablet Take 1 tablet (325 mg total) by mouth daily with breakfast. (Patient taking differently: Take 325 mg by mouth every other day. Monday, Wednesday, Friday) 90 tablet 1 10/17/2023   losartan  (COZAAR ) 25 MG tablet Take 25 mg by mouth daily.   10/17/2023   pantoprazole  (PROTONIX ) 40 MG tablet Take 1 tablet (40 mg total) by mouth daily. 90 tablet 1 10/17/2023   vitamin E 180 MG (400 UNITS) capsule Take 400 Units by mouth daily.   10/17/2023    Assessment: 78 YOF. New onset Afib. Not on anticoagulation at home. Hx of CVA in 2018. Note history  of GI bleed secondary to AVMs in past.   Heparin level is sub-therapeutic at 0.24 on current rate of 700 units/hr.  Hgb down some at 8.5. Platelets high at 450. No bleeding noted. No issues with the infusion. SCr is trending up at 1.70 with decreased CrCl at ~21 ml/min.   10/3 PM: Heparin level 0.36 on 800 units/hr, no issues with bleeding or infusion. Therapeutic, continue rate.  Goal of Therapy:  Heparin level 0.3-0.7 units/ml Monitor platelets by anticoagulation protocol: Yes   Plan:  Continue heparin at 800 units / hr Follow up AM heparin level, CBC  Larraine Brazier, PharmD Clinical Pharmacist 10/20/2023  5:31 PM **Pharmacist phone directory can now be found on [email or web address] ([email or web address]).  Listed under [facility].

## 2023-10-20 NOTE — Progress Notes (Signed)
  Notify Nurse of results: ABG results  pH 7.31 pCO2 37.3 pO2 105 HCO3 19.1

## 2023-10-21 ENCOUNTER — Inpatient Hospital Stay (HOSPITAL_COMMUNITY)

## 2023-10-21 DIAGNOSIS — J441 Chronic obstructive pulmonary disease with (acute) exacerbation: Secondary | ICD-10-CM | POA: Diagnosis not present

## 2023-10-21 DIAGNOSIS — R918 Other nonspecific abnormal finding of lung field: Secondary | ICD-10-CM | POA: Diagnosis not present

## 2023-10-21 DIAGNOSIS — J9601 Acute respiratory failure with hypoxia: Secondary | ICD-10-CM | POA: Diagnosis not present

## 2023-10-21 DIAGNOSIS — Z87891 Personal history of nicotine dependence: Secondary | ICD-10-CM | POA: Diagnosis not present

## 2023-10-21 LAB — GLUCOSE, CAPILLARY
Glucose-Capillary: 136 mg/dL — ABNORMAL HIGH (ref 70–99)
Glucose-Capillary: 155 mg/dL — ABNORMAL HIGH (ref 70–99)
Glucose-Capillary: 162 mg/dL — ABNORMAL HIGH (ref 70–99)
Glucose-Capillary: 178 mg/dL — ABNORMAL HIGH (ref 70–99)
Glucose-Capillary: 184 mg/dL — ABNORMAL HIGH (ref 70–99)
Glucose-Capillary: 189 mg/dL — ABNORMAL HIGH (ref 70–99)

## 2023-10-21 LAB — CBC
HCT: 26.6 % — ABNORMAL LOW (ref 36.0–46.0)
Hemoglobin: 8.3 g/dL — ABNORMAL LOW (ref 12.0–15.0)
MCH: 24.8 pg — ABNORMAL LOW (ref 26.0–34.0)
MCHC: 31.2 g/dL (ref 30.0–36.0)
MCV: 79.4 fL — ABNORMAL LOW (ref 80.0–100.0)
Platelets: 452 K/uL — ABNORMAL HIGH (ref 150–400)
RBC: 3.35 MIL/uL — ABNORMAL LOW (ref 3.87–5.11)
RDW: 16.4 % — ABNORMAL HIGH (ref 11.5–15.5)
WBC: 26.5 K/uL — ABNORMAL HIGH (ref 4.0–10.5)
nRBC: 0.1 % (ref 0.0–0.2)

## 2023-10-21 LAB — BASIC METABOLIC PANEL WITH GFR
Anion gap: 16 — ABNORMAL HIGH (ref 5–15)
BUN: 53 mg/dL — ABNORMAL HIGH (ref 8–23)
CO2: 19 mmol/L — ABNORMAL LOW (ref 22–32)
Calcium: 7.7 mg/dL — ABNORMAL LOW (ref 8.9–10.3)
Chloride: 99 mmol/L (ref 98–111)
Creatinine, Ser: 2.29 mg/dL — ABNORMAL HIGH (ref 0.44–1.00)
GFR, Estimated: 21 mL/min — ABNORMAL LOW (ref >60)
Glucose, Bld: 162 mg/dL — ABNORMAL HIGH (ref 70–99)
Potassium: 3.4 mmol/L — ABNORMAL LOW (ref 3.5–5.1)
Sodium: 134 mmol/L — ABNORMAL LOW (ref 135–145)

## 2023-10-21 LAB — MAGNESIUM: Magnesium: 2.5 mg/dL — ABNORMAL HIGH (ref 1.7–2.4)

## 2023-10-21 LAB — ACID FAST SMEAR (AFB, MYCOBACTERIA): Acid Fast Smear: NEGATIVE

## 2023-10-21 LAB — PHOSPHORUS: Phosphorus: 5 mg/dL — ABNORMAL HIGH (ref 2.5–4.6)

## 2023-10-21 LAB — HEPARIN LEVEL (UNFRACTIONATED): Heparin Unfractionated: 0.29 [IU]/mL — ABNORMAL LOW (ref 0.30–0.70)

## 2023-10-21 MED ORDER — VANCOMYCIN VARIABLE DOSE PER UNSTABLE RENAL FUNCTION (PHARMACIST DOSING): Status: DC

## 2023-10-21 MED ORDER — LACTATED RINGERS IV BOLUS
1000.0000 mL | Freq: Once | INTRAVENOUS | Status: AC
  Administered 2023-10-21: 1000 mL via INTRAVENOUS

## 2023-10-21 MED ORDER — POTASSIUM CHLORIDE 20 MEQ PO PACK
40.0000 meq | PACK | Freq: Once | ORAL | Status: AC
  Administered 2023-10-21: 40 meq
  Filled 2023-10-21: qty 2

## 2023-10-21 MED ORDER — PIPERACILLIN-TAZOBACTAM IN DEX 2-0.25 GM/50ML IV SOLN
2.2500 g | Freq: Three times a day (TID) | INTRAVENOUS | Status: AC
  Administered 2023-10-21 – 2023-10-23 (×7): 2.25 g via INTRAVENOUS
  Filled 2023-10-21 (×8): qty 50

## 2023-10-21 NOTE — Progress Notes (Signed)
 NAME:  [name], MRN:  [id number], DOB:  03/15/1945, LOS: 3 ADMISSION DATE:  10/18/2023 CHIEF COMPLAINT:  SOB.    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  Presented from independent living facility after found to have noisy breathing and several days of progressive SOB found to be hypoxic in the 80's.  Pt states has been SOB for 3 days with new dry cough and poor PO intake related to dyspnea.  Denies fever, chills, chest pain, wt loss (although appears to have 19lb wt loss since 01/2023), hemoptysis, dysphagia or choking episodes. Does not use oxygen, nebs/ inhalers at baseline and denies OSA.    Treated in ER with O2, nebs, solumedrol.  Afebrile in ER, requiring 6L.  WBC 18.2, Hgb 9.4, sCr 1.48, ABG 7.414/ 29/ 153/ 18.6.  SARS/ flu/ RSV neg. CXR concerning for RLL caviatry lesion.  Empiric vancomycin  and zosyn started. CTA PE obtained, negative for PE, but extensive abnormality of RLL, RML and basilar aspect of RUE with mass like consolidation in RLL with multiple airway occlusions including endobronchial tumor/ debris and large 8.5 cm cavitary lesion of RLL with communication directly into lower lobe bronchi concerning for extensive pulmonary malignancy, pneumonia remains in differential.  Abnormal bronchovascular distribution within lingula extending into subpleural surface.  Also noted right renal abnormal contour suspicious for solid renal mass. Admitted to [facility] with Pulmonary consulting for further recomendations.   Pertinent  Medical History  former smoker (quit 2018), emphysema, CVA on plavix , HTN, HLD, GERD, anxiety, depression and GIB (AVMs-duodenal, cecal and right colon), OSA (denies), PVD, CKD3a, IDA, squamous cell carcinoma of tongue s/p excision  (2018)  Interim History / Subjective:  Pressure support trial failed as the patient became tachypneic.  Still having a lot of secretions which are thick and cloudy.  Significant Hospital Events: 10/1: admitted.  10/2: intubated.   Objective    Blood pressure (!) 154/77, pulse (!) 103, temperature (!) 97 F (36.1 C), temperature source Axillary, resp. rate (!) 29, height 5' (1.524 m), weight 57.1 kg, SpO2 98%.    Vent Mode: PRVC FiO2 (%):  [40 %-100 %] 60 % Set Rate:  [20 bmp] 20 bmp Vt Set:  [400 mL] 400 mL PEEP:  [5 cmH20] 5 cmH20 Pressure Support:  [5 cmH20] 5 cmH20 Plateau Pressure:  [10 cmH20] 10 cmH20   Intake/Output Summary (Last 24 hours) at 10/21/2023 1542 Last data filed at 10/21/2023 1500 Gross per 24 hour  Intake 1110.95 ml  Output 395 ml  Net 715.95 ml   Filed Weights   10/18/23 0942 10/21/23 0600  Weight: 56.7 kg 57.1 kg    Examination: General: Sedated and intubated elderly lady. Lungs: Diminished breath sounds bilaterally.  No wheezing appreciated today. Heart: regular rate rhythm, no murmur appreciated.  Abdomen: non tender, non distended. Normal BS.  Neuro: Sedated.   Assessment and Plan  Right lower lobe cavitary lung mass with opacity in right middle lobe and left lingula-lung abscess, will need to rule out malignancy:  Former smoker and prior tongue SCC: Hypoxic respiratory failure: COPD exacerbation: - Lung opacity likely malignancy versus lung abscess. - Intubated.  LT VV.  I made vent setting changes to make the patient more synchronous with the vent.  Follow blood gas normal. - Continue Vanco and  Zosyn> BAL growing Staph aureus.  MRSA negative.  DC vancomycin .  Continue Zosyn. -Solu-Medrol, scheduled DuoNebs, budesonide nebs.  Renal cyst: - Dedicated CT abdomen did not show any metastatic changes.  However did show change in density of the renal mass compared to before.  - Renal ultrasound performed shows simple renal cyst stable since last  2 years.   A-fib: -Echo: EF normal with reduced RV function. - Low-dose metoprolol. - heparin gtt.    Elevated FT4 - started on methimazole for FT4 of 1.32. Will dc.  -Will need TSH and free T4 as an outpatient.  If continued elevation of free T4 will need to be started on methimazole.   Severe hypokalemia: - Replace as needed.   AKI on CKD stage IIIb: - Started IV fluids for worsening creatinine.   History of CVA: -Aspirin  and statin for now. -Plavix  was changed to aspirin  while being transferred to the ICU if need for procedures. -Heparin drip as above.   GERD PPI.    Updated brother Norleen on 10/3 who lives in [place] .  Updated [place] her neighbor on 10/3.  Labs   CBC: Recent Labs  Lab 10/18/23 0937 10/18/23 0959 10/19/23 0428 10/19/23 1836 10/19/23 2002 10/20/23 0001 10/20/23 0412 10/20/23 1133 10/21/23 0239  WBC 18.2*  --  17.1*  --   --   --  19.2*  --  26.5*  NEUTROABS 14.1*  --   --   --   --   --   --   --   --   HGB 9.4*   < > 8.9*   < > 10.5* 9.2* 8.5* 8.8* 8.3*  HCT 30.1*   < > 27.6*   < > 31.0* 27.0* 27.6* 26.0* 26.6*  MCV 79.0*  --  77.7*  --   --   --  80.5  --  79.4*  PLT 473*  --  394  --   --   --  450*  --  452*   < > = values in this interval not displayed.    Basic Metabolic Panel: Recent Labs  Lab 10/18/23 0937 10/18/23 0959 10/19/23 0425 10/19/23 0428 10/19/23 0744 10/19/23 1836 10/19/23 2002 10/20/23 0001 10/20/23 0412 10/20/23 1133 10/21/23 0239  NA 136   < >  --  134* 133*   < > 134* 133* 135 134* 134*  K 3.3*   < >  --  2.9* 3.9   < > 3.9 3.8 3.9 3.5 3.4*  CL 101  --   --  101 99  --   --   --  102  --  99  CO2 16*  --   --  19* 14*  --   --   --  17*  --  19*  GLUCOSE 125*  --   --  152* 139*  --   --   --  143*  --  162*  BUN 39*  --   --  33* 34*  --   --   --  40*  --  53*  CREATININE 1.48*  --   --  1.19* 1.27*  --   --   --  1.70*  --  2.29*  CALCIUM  7.8*  --   --  7.9* 8.0*  --   --   --  7.9*  --  7.7*  MG  --    --  2.0  --  2.2  --   --   --  2.0  --  2.5*  PHOS  --   --   --   --  4.1  --   --   --  4.9*  --  5.0*   < > = values in this interval not displayed.   GFR: Estimated Creatinine Clearance: 15.8 mL/min (A) (by C-G formula based on SCr of 2.29 mg/dL (H)). Recent Labs  Lab 10/18/23 0937 10/18/23 0959 10/18/23 1420 10/19/23 0428 10/19/23 1832 10/20/23 0412 10/21/23 0239  PROCALCITON  --   --  2.42  --  2.40  --   --   WBC 18.2*  --   --  17.1*  --  19.2* 26.5*  LATICACIDVEN  --  1.3  --   --   --   --   --     Liver Function Tests: Recent Labs  Lab 10/18/23 0937 10/19/23 0428  AST 26 24  ALT 13 14  ALKPHOS 107 103  BILITOT 1.3* 0.8  PROT 6.5 5.8*  ALBUMIN 2.1* 1.9*   No results for input(s): LIPASE, AMYLASE in the last 168 hours. No results for input(s): AMMONIA in the last 168 hours.  ABG    Component Value Date/Time   PHART 7.341 (L) 10/20/2023 1133   PCO2ART 34.9 10/20/2023 1133   PO2ART 69 (L) 10/20/2023 1133   HCO3 18.9 (L) 10/20/2023 1133   TCO2 20 (L) 10/20/2023 1133   ACIDBASEDEF 6.0 (H) 10/20/2023 1133   O2SAT 93 10/20/2023 1133      Past Medical History:  She,  has a past medical history of Asthma, Cancer (HCC), Depression, Hypertension, Peripheral vascular disease, Pneumonia, Sleep apnea, and Stroke (HCC).   Surgical History:   Past Surgical History:  Procedure Laterality Date   BIOPSY  08/16/2021   Procedure: BIOPSY;  Surgeon: Elicia Claw, MD;  Location: [facility] ENDOSCOPY;  Service: Gastroenterology;;   COLONOSCOPY WITH PROPOFOL  N/A 08/16/2021   Procedure: COLONOSCOPY WITH PROPOFOL ;  Surgeon: Elicia Claw, MD;  Location: [facility] ENDOSCOPY;  Service: Gastroenterology;  Laterality: N/A;   DIRECT LARYNGOSCOPY Left 06/29/2016   Procedure: WIDE EXCISION LEFT TONGUE CANCER DIRECT LARYNGOSCOPY;  Surgeon: Arlana Arnt, MD;  Location[facility] OR;  Service: ENT;  Laterality: Left;   ESOPHAGOGASTRODUODENOSCOPY (EGD) WITH PROPOFOL  Left 07/12/2018    Procedure: ESOPHAGOGASTRODUODENOSCOPY (EGD) WITH PROPOFOL ;  Surgeon: Dianna Specking, MD;  Location: [facility] ENDOSCOPY;  Service: Endoscopy;  Laterality: Left;   ESOPHAGOGASTRODUODENOSCOPY (EGD) WITH PROPOFOL  N/A 08/16/2021   Procedure: ESOPHAGOGASTRODUODENOSCOPY (EGD) WITH PROPOFOL ;  Surgeon: Elicia Claw, MD;  Location: [facility] ENDOSCOPY;  Service: Gastroenterology;  Laterality: N/A;   ESOPHAGOSCOPY N/A 06/29/2016   Procedure: ESOPHAGOSCOPY/BRONCHOSCOPY;  Surgeon: Arlana Arnt, MD;  Location: [facility] OR;  Service: ENT;  Laterality: N/A;   EYE SURGERY Bilateral 2016   HEMOSTASIS CLIP PLACEMENT  08/16/2021   Procedure: HEMOSTASIS CLIP PLACEMENT;  Surgeon: Elicia Claw, MD;  Location: [facility] ENDOSCOPY;  Service: Gastroenterology;;   HOT HEMOSTASIS N/A 07/12/2018   Procedure: HOT HEMOSTASIS (ARGON PLASMA COAGULATION/BICAP);  Surgeon: Dianna Specking, MD;  Location: [facility] ENDOSCOPY;  Service: Endoscopy;  Laterality: N/A;   HOT HEMOSTASIS N/A 08/16/2021   Procedure: HOT HEMOSTASIS (ARGON PLASMA COAGULATION/BICAP);  Surgeon: Elicia Claw, MD;  Location: [facility] ENDOSCOPY;  Service: Gastroenterology;  Laterality: N/A;   KNEE SURGERY Right 2014   LOOP RECORDER INSERTION N/A 03/21/2016   Procedure: Loop Recorder Insertion;  Surgeon: Will Gladis Norton, MD;  Location: [facility] INVASIVE CV LAB;  Service: Cardiovascular;  Laterality: N/A;   PARS PLANA VITRECTOMY Right 02/28/2014   Procedure: PARS PLANA  VITRECTOMY 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 WITH TAP AND ANTIBIOTIC INJECTION;  Surgeon: Arley DELENA Ruder, MD;  Location: [facility] OR;  Service: Ophthalmology;  Laterality: Right;   POLYPECTOMY  08/16/2021   Procedure: POLYPECTOMY;  Surgeon: Elicia Claw, MD;  Location: [facility] ENDOSCOPY;  Service: Gastroenterology;;   SCLEROTHERAPY  08/16/2021   Procedure: MATIAS;  Surgeon: Elicia Claw, MD;  Location: [facility] ENDOSCOPY;  Service: Gastroenterology;;   TEE WITHOUT CARDIOVERSION N/A 03/21/2016   Procedure: TRANSESOPHAGEAL ECHOCARDIOGRAM  (TEE);  Surgeon: Annabella Scarce, MD;  Location: [facility] ENDOSCOPY;  Service: Cardiovascular;  Laterality: N/A;     Social History:   reports that she quit smoking about 7 years ago. She has never used smokeless tobacco. She reports that she does not drink alcohol  and does not use drugs.   Family History:  Her family history includes Cancer in her mother; Heart disease in her brother; Stroke in her father.   Allergies Allergies  Allergen Reactions   Codeine Itching and Other (See Comments)    Caused mouth to break out in hives   Neosporin [Neomycin-Bacitracin Zn-Polymyx] Itching and Swelling    Swelling at application site   Percocet [Oxycodone -Acetaminophen ] Other (See Comments)    B-blockers, triggers depression   Tussionex Pennkinetic Er [Hydrocod Poli-Chlorphe Poli Er] Itching     Home Medications  Prior to Admission medications   Medication Sig Start Date End Date Taking? Authorizing Provider  amLODipine  (NORVASC ) 10 MG tablet Take 1 tablet (10 mg total) by mouth daily. 05/09/18  Yes Ricky Fines, MD  atorvastatin  (LIPITOR) 10 MG tablet Take 10 mg by mouth daily.  03/16/16  Yes [provider]  clopidogrel  (PLAVIX ) 75 MG tablet Take 1 tablet (75 mg total) by mouth daily. 07/17/18  Yes Gherghe, Costin M, MD  escitalopram  (LEXAPRO ) 20 MG tablet Take 20 mg by mouth daily.  07/10/14  Yes [provider]  ferrous sulfate  325 (65 FE) MG tablet Take 1 tablet (325 mg total) by mouth daily with breakfast. Patient taking differently: Take 325 mg by mouth every other day. Monday, Wednesday, Friday 07/13/18  Yes Gherghe, Costin M, MD  losartan  (COZAAR ) 25 MG tablet Take 25 mg by mouth daily. 09/29/23  Yes [provider]  pantoprazole  (PROTONIX ) 40 MG tablet Take 1 tablet (40 mg total) by mouth daily. 07/13/18  Yes Gherghe, Costin M, MD  vitamin E 180 MG (400 UNITS) capsule Take 400 Units by mouth daily.   Yes [provider]     CRITICAL CARE Performed by:  Sammi JONETTA Fredericks.     Total critical care time: 50 minutes   Critical care time was exclusive of separately billable procedures and treating other patients.   Critical care was necessary to treat or prevent imminent or life-threatening deterioration.   Critical care was time spent personally by me on the following activities: development of treatment plan with patient and/or surrogate as well as nursing, discussions with consultants, evaluation of patient's response to treatment, examination of patient, obtaining history from patient or surrogate, ordering and performing treatments and interventions, ordering and review of laboratory studies, ordering and review of radiographic studies, pulse oximetry, re-evaluation of patient's condition and participation in multidisciplinary rounds.  Sammi JONETTA Fredericks, MD Pulmonary, Critical Care and Sleep Attending.  Pager: [phone]  10/21/2023, 3:42 PM

## 2023-10-21 NOTE — Progress Notes (Signed)
 Pharmacy Antibiotic Note  [name] is a 78 y.o. female for which pharmacy has been consulted for vancomycin  and zosyn dosing for pneumonia.  Patient with a history of Emphysema, CVA, HTN, HLD, GERD, GIB, PVD, CKD, squamous cell carcinoma on tongue. Patient presenting with SOB.  WBC up 26.5 (on Solumedrol IV).  SCr continues to rise at 2.29. Poor UOP.  Afebrile.   Plan: Hold vancomycin  for now and check level in AM (~36 hrs post dose) Reduce Zosyn 2.25g IV every 8 hours.  Monitor WBC, fever, renal function, cultures De-escalate when able Levels at steady state  Height: 5' (152.4 cm) Weight: 57.1 kg (125 lb 14.1 oz) IBW/kg (Calculated) : 45.5  Temp (24hrs), Avg:98.1 F (36.7 C), Min:97.1 F (36.2 C), Max:98.7 F (37.1 C)  Recent Labs  Lab 10/18/23 0937 10/18/23 0959 10/19/23 0428 10/19/23 0744 10/20/23 0412 10/21/23 0239  WBC 18.2*  --  17.1*  --  19.2* 26.5*  CREATININE 1.48*  --  1.19* 1.27* 1.70* 2.29*  LATICACIDVEN  --  1.3  --   --   --   --     Estimated Creatinine Clearance: 15.8 mL/min (A) (by C-G formula based on SCr of 2.29 mg/dL (H)).    Allergies  Allergen Reactions   Codeine Itching and Other (See Comments)    Caused mouth to break out in hives   Neosporin [Neomycin-Bacitracin Zn-Polymyx] Itching and Swelling    Swelling at application site   Percocet [Oxycodone -Acetaminophen ] Other (See Comments)    B-blockers, triggers depression   Tussionex Pennkinetic Er [Hydrocod Poli-Chlorphe Poli Er] Itching   Microbiology results: 10/1 BCx >> 10/1 Resp Cx >> 10/1 MRSA PCR negative  Thank you for allowing pharmacy to be a part of this patient's care.  Harlene Boga, PharmD, BCPS, BCCCP Clinical Pharmacist Please refer to [facility] for [facility] Pharmacy numbers 10/21/2023 11:24 AM

## 2023-10-21 NOTE — Progress Notes (Addendum)
 Notified Elink, RN Maya Ruhl) of patient decrease in hourly urine output. Ema, MD notified. See I/Os.

## 2023-10-21 NOTE — Progress Notes (Signed)
 PHARMACY - ANTICOAGULATION CONSULT NOTE  Pharmacy Consult for Heparin infusion Indication: atrial fibrillation  Allergies  Allergen Reactions   Codeine Itching and Other (See Comments)    Caused mouth to break out in hives   Neosporin [Neomycin-Bacitracin Zn-Polymyx] Itching and Swelling    Swelling at application site   Percocet [Oxycodone -Acetaminophen ] Other (See Comments)    B-blockers, triggers depression   Tussionex Pennkinetic Er [Hydrocod Poli-Chlorphe Poli Er] Itching    Patient Measurements: Height: 5' (152.4 cm) Weight: 57.1 kg (125 lb 14.1 oz) IBW/kg (Calculated) : 45.5 HEPARIN DW (KG): 56.7  Vital Signs: Temp: 98.7 F (37.1 C) (10/04 0752) Temp Source: Axillary (10/04 0752) BP: 104/55 (10/04 0749) Pulse Rate: 82 (10/04 0749)  Labs: Recent Labs    10/18/23 2203 10/19/23 0001 10/19/23 0428 10/19/23 0428 10/19/23 0744 10/19/23 1836 10/20/23 0412 10/20/23 1133 10/20/23 1705 10/21/23 0239  HGB  --   --  8.9*  --   --    < > 8.5* 8.8*  --  8.3*  HCT  --   --  27.6*  --   --    < > 27.6* 26.0*  --  26.6*  PLT  --   --  394  --   --   --  450*  --   --  452*  HEPARINUNFRC  --   --   --    < > 0.47  --  0.24*  --  0.36 0.29*  CREATININE  --   --  1.19*   < > 1.27*  --  1.70*  --   --  2.29*  TROPONINIHS 43* 46*  --   --   --   --   --   --   --   --    < > = values in this interval not displayed.    Estimated Creatinine Clearance: 15.8 mL/min (A) (by C-G formula based on SCr of 2.29 mg/dL (H)).   Medical History: Past Medical History:  Diagnosis Date   Asthma    Cancer (HCC)    Depression    Hypertension    Peripheral vascular disease    Pneumonia    Sleep apnea    Stroke (HCC)     Medications:  Medications Prior to Admission  Medication Sig Dispense Refill Last Dose/Taking   amLODipine  (NORVASC ) 10 MG tablet Take 1 tablet (10 mg total) by mouth daily.   10/17/2023   atorvastatin  (LIPITOR) 10 MG tablet Take 10 mg by mouth daily.   3 10/17/2023    clopidogrel  (PLAVIX ) 75 MG tablet Take 1 tablet (75 mg total) by mouth daily. 30 tablet 1 10/17/2023   escitalopram  (LEXAPRO ) 20 MG tablet Take 20 mg by mouth daily.   5 10/17/2023   ferrous sulfate  325 (65 FE) MG tablet Take 1 tablet (325 mg total) by mouth daily with breakfast. (Patient taking differently: Take 325 mg by mouth every other day. Monday, Wednesday, Friday) 90 tablet 1 10/17/2023   losartan  (COZAAR ) 25 MG tablet Take 25 mg by mouth daily.   10/17/2023   pantoprazole  (PROTONIX ) 40 MG tablet Take 1 tablet (40 mg total) by mouth daily. 90 tablet 1 10/17/2023   vitamin E 180 MG (400 UNITS) capsule Take 400 Units by mouth daily.   10/17/2023    Assessment: 78 YOF. New onset Afib. Not on anticoagulation at home. Hx of CVA in 2018. Note history of GI bleed secondary to AVMs in past.   Heparin level is sub-therapeutic at 0.29 (  decreased) on current rate of 800 units/hr. Hgb down some at 8.3. Platelets remain elevated. No bleeding noted. No issues with the infusion. SCr is trending up at 2.29 with decreased CrCl at ~15.8 ml/min.   Goal of Therapy:  Heparin level 0.3-0.7 units/ml Monitor platelets by anticoagulation protocol: Yes   Plan:  Increase heparin to 850 units / hr Daily heparin level and CBC  Jovonte Commins, PharmD, BCPS, BCCCP Clinical Pharmacist Please refer to [facility] for [facility] Pharmacy numbers 10/21/2023  11:03 AM .

## 2023-10-21 NOTE — Progress Notes (Addendum)
 eLink Physician-Brief Progress Note Patient Name: [name] DOB: [birth year] MRN: [id number]   Date of Service  10/21/2023  HPI/Events of Note  Foley catheter placed, 125 cc urine output for the shift, stable hemodynamics, positive a 47 for the day, +3.5 L for the stay  eICU Interventions  Continue observation for now   0605 -K3.4, KCl  Intervention Category Intermediate Interventions: Oliguria - evaluation and management  Demetrius Barrell 10/21/2023, 1:33 AM

## 2023-10-21 NOTE — Plan of Care (Signed)
 Progressing Add All Education: Knowledge of General Education information will improve Add Today at 2253 - Progressing by Rolfe Corean HERO, RN Add Health Behavior/Discharge Planning: Ability to manage health-related needs will improve Add Today at 2253 - Progressing by Rolfe Corean HERO, RN Add Clinical Measurements: Ability to maintain clinical measurements within normal limits will improve Add Today at 2253 - Progressing by Rolfe Corean HERO, RN Add Will remain free from infection Add Today at 2253 - Progressing by Rolfe Corean HERO, RN Add Diagnostic test results will improve Add Today at 2253 - Progressing by Rolfe Corean HERO, RN Add Respiratory complications will improve Add Today at 2253 - Progressing by Rolfe Corean HERO, RN Add Cardiovascular complication will be avoided Add Today at 2253 - Progressing by Rolfe Corean HERO, RN Add Activity: Risk for activity intolerance will decrease Add Today at 2253 - Progressing by Rolfe Corean HERO, RN Add Nutrition: Adequate nutrition will be maintained Add Today at 2253 - Progressing by Rolfe Corean HERO, RN Add Coping: Level of anxiety will decrease Add Today at 2253 - Progressing by Rolfe Corean HERO, RN Add Elimination: Will not experience complications related to bowel motility Add Today at 2253 - Progressing by Rolfe Corean HERO, RN Add Will not experience complications related to urinary retention Add Today at 2253 - Progressing by Rolfe Corean HERO, RN Add Pain Managment: General experience of comfort will improve and/or be controlled Add Today at 2253 - Progressing by Rolfe Corean HERO, RN Add Safety: Ability to remain free from injury will improve Add Today at 2253 - Progressing by Rolfe Corean HERO, RN Add Skin Integrity: Risk for impaired skin integrity will decrease Add Today at 2253 - Progressing by Rolfe Corean HERO, RN Add Activity: Ability to tolerate increased activity will improve Add Today at 2253 - Progressing by Rolfe Corean HERO, RN Add Respiratory: Ability to maintain a clear airway and adequate ventilation will improve Add Today at 2253 - Progressing by Rolfe Corean HERO, RN Add Role Relationship: Method of communication will improve Add Today at 2253 - Progressing by Rolfe Corean HERO, RN Add Education: Ability to describe self-care measures that may prevent or decrease complications (Diabetes Survival Skills Education) will improve Add Today at 2253 - Progressing by Rolfe Corean HERO, RN Add Individualized Educational Video(s) Add Today at 2253 - Progressing by Rolfe Corean HERO, RN Add Coping: Ability to adjust to condition or change in health will improve Add Today at 2253 - Progressing by Rolfe Corean HERO, RN Add Fluid Volume: Ability to maintain a balanced intake and output will improve Add Today at 2253 - Progressing by Rolfe Corean HERO, RN Add Health Behavior/Discharge Planning: Ability to identify and utilize available resources and services will improve Add Today at 2253 - Progressing by Rolfe Corean HERO, RN Add Ability to manage health-related needs will improve Add Today at 2253 - Progressing by Rolfe Corean HERO, RN Add Metabolic: Ability to maintain appropriate glucose levels will improve Add Today at 2253 - Progressing by Rolfe Corean HERO, RN Add Nutritional: Maintenance of adequate nutrition will improve Add Today at 2253 - Progressing by Rolfe Corean HERO, RN Add Progress toward achieving an optimal weight will improve Add Today at 2253 - Progressing by Rolfe Corean HERO, RN Add Skin Integrity: Risk for impaired skin integrity will decrease Add Today at 2253 - Progressing by Rolfe Corean HERO, RN Add Tissue Perfusion: Adequacy of tissue perfusion will  improve Add Today at 2253 - Progressing by Rolfe Corean HERO, RN

## 2023-10-22 DIAGNOSIS — R918 Other nonspecific abnormal finding of lung field: Secondary | ICD-10-CM | POA: Diagnosis not present

## 2023-10-22 DIAGNOSIS — Z87891 Personal history of nicotine dependence: Secondary | ICD-10-CM | POA: Diagnosis not present

## 2023-10-22 DIAGNOSIS — J9601 Acute respiratory failure with hypoxia: Secondary | ICD-10-CM | POA: Diagnosis not present

## 2023-10-22 DIAGNOSIS — J441 Chronic obstructive pulmonary disease with (acute) exacerbation: Secondary | ICD-10-CM | POA: Diagnosis not present

## 2023-10-22 LAB — URINALYSIS, DIPSTICK ONLY
Bilirubin Urine: NEGATIVE
Glucose, UA: NEGATIVE mg/dL
Ketones, ur: NEGATIVE mg/dL
Nitrite: NEGATIVE
Protein, ur: 100 mg/dL — AB
Specific Gravity, Urine: 1.015 (ref 1.005–1.030)
pH: 5 (ref 5.0–8.0)

## 2023-10-22 LAB — BASIC METABOLIC PANEL WITH GFR
Anion gap: 13 (ref 5–15)
Anion gap: 15 (ref 5–15)
BUN: 73 mg/dL — ABNORMAL HIGH (ref 8–23)
BUN: 83 mg/dL — ABNORMAL HIGH (ref 8–23)
CO2: 20 mmol/L — ABNORMAL LOW (ref 22–32)
CO2: 18 mmol/L — ABNORMAL LOW (ref 22–32)
Calcium: 7.7 mg/dL — ABNORMAL LOW (ref 8.9–10.3)
Calcium: 7.5 mg/dL — ABNORMAL LOW (ref 8.9–10.3)
Chloride: 102 mmol/L (ref 98–111)
Chloride: 100 mmol/L (ref 98–111)
Creatinine, Ser: 2.97 mg/dL — ABNORMAL HIGH (ref 0.44–1.00)
Creatinine, Ser: 3.2 mg/dL — ABNORMAL HIGH (ref 0.44–1.00)
GFR, Estimated: 16 mL/min — ABNORMAL LOW (ref >60)
GFR, Estimated: 14 mL/min — ABNORMAL LOW (ref >60)
Glucose, Bld: 198 mg/dL — ABNORMAL HIGH (ref 70–99)
Glucose, Bld: 201 mg/dL — ABNORMAL HIGH (ref 70–99)
Potassium: 5.1 mmol/L (ref 3.5–5.1)
Potassium: 5.6 mmol/L — ABNORMAL HIGH (ref 3.5–5.1)
Sodium: 135 mmol/L (ref 135–145)
Sodium: 133 mmol/L — ABNORMAL LOW (ref 135–145)

## 2023-10-22 LAB — CBC
HCT: 26.6 % — ABNORMAL LOW (ref 36.0–46.0)
Hemoglobin: 8.2 g/dL — ABNORMAL LOW (ref 12.0–15.0)
MCH: 25.2 pg — ABNORMAL LOW (ref 26.0–34.0)
MCHC: 30.8 g/dL (ref 30.0–36.0)
MCV: 81.8 fL (ref 80.0–100.0)
Platelets: 509 K/uL — ABNORMAL HIGH (ref 150–400)
RBC: 3.25 MIL/uL — ABNORMAL LOW (ref 3.87–5.11)
RDW: 16.9 % — ABNORMAL HIGH (ref 11.5–15.5)
WBC: 37.6 K/uL — ABNORMAL HIGH (ref 4.0–10.5)
nRBC: 0 % (ref 0.0–0.2)

## 2023-10-22 LAB — CREATININE, URINE, RANDOM: Creatinine, Urine: 52 mg/dL

## 2023-10-22 LAB — SODIUM, URINE, RANDOM: Sodium, Ur: <30 mmol/L

## 2023-10-22 LAB — CULTURE, RESPIRATORY W GRAM STAIN

## 2023-10-22 LAB — PHOSPHORUS: Phosphorus: 6.9 mg/dL — ABNORMAL HIGH (ref 2.5–4.6)

## 2023-10-22 LAB — GLUCOSE, CAPILLARY
Glucose-Capillary: 172 mg/dL — ABNORMAL HIGH (ref 70–99)
Glucose-Capillary: 107 mg/dL — ABNORMAL HIGH (ref 70–99)
Glucose-Capillary: 157 mg/dL — ABNORMAL HIGH (ref 70–99)
Glucose-Capillary: 201 mg/dL — ABNORMAL HIGH (ref 70–99)
Glucose-Capillary: 222 mg/dL — ABNORMAL HIGH (ref 70–99)
Glucose-Capillary: 185 mg/dL — ABNORMAL HIGH (ref 70–99)
Glucose-Capillary: 206 mg/dL — ABNORMAL HIGH (ref 70–99)

## 2023-10-22 LAB — POTASSIUM: Potassium: 4.9 mmol/L (ref 3.5–5.1)

## 2023-10-22 LAB — HEPARIN LEVEL (UNFRACTIONATED): Heparin Unfractionated: 0.34 [IU]/mL (ref 0.30–0.70)

## 2023-10-22 LAB — MAGNESIUM: Magnesium: 2.4 mg/dL (ref 1.7–2.4)

## 2023-10-22 MED ORDER — LINEZOLID 600 MG/300ML IV SOLN
600.0000 mg | Freq: Two times a day (BID) | INTRAVENOUS | Status: DC
  Administered 2023-10-22 – 2023-10-23 (×3): 600 mg via INTRAVENOUS
  Filled 2023-10-22 (×3): qty 300

## 2023-10-22 MED ORDER — SODIUM ZIRCONIUM CYCLOSILICATE 10 G PO PACK
10.0000 g | PACK | Freq: Three times a day (TID) | ORAL | Status: AC
  Administered 2023-10-22 (×2): 10 g
  Filled 2023-10-22 (×2): qty 1

## 2023-10-22 MED ORDER — METHYLPREDNISOLONE SODIUM SUCC 40 MG IJ SOLR
40.0000 mg | Freq: Every day | INTRAMUSCULAR | Status: AC
  Administered 2023-10-23: 40 mg via INTRAVENOUS
  Filled 2023-10-22: qty 1

## 2023-10-22 MED ORDER — LACTATED RINGERS IV BOLUS
1000.0000 mL | Freq: Once | INTRAVENOUS | Status: AC
Start: 2023-10-22 — End: 2023-10-22
  Administered 2023-10-22: 1000 mL via INTRAVENOUS

## 2023-10-22 MED ORDER — DEXTROSE 50 % IV SOLN
1.0000 | Freq: Once | INTRAVENOUS | Status: AC
  Administered 2023-10-22: 50 mL via INTRAVENOUS
  Filled 2023-10-22: qty 50

## 2023-10-22 MED ORDER — LACTATED RINGERS IV BOLUS
500.0000 mL | Freq: Once | INTRAVENOUS | Status: AC
  Administered 2023-10-22: 500 mL via INTRAVENOUS

## 2023-10-22 MED ORDER — INSULIN ASPART 100 UNIT/ML IV SOLN
10.0000 [IU] | Freq: Once | INTRAVENOUS | Status: AC
  Administered 2023-10-22: 10 [IU] via INTRAVENOUS

## 2023-10-22 MED ORDER — SODIUM CHLORIDE 0.9 % IV SOLN
INTRAVENOUS | Status: DC
Start: 2023-10-22 — End: 2023-10-23

## 2023-10-22 NOTE — Progress Notes (Signed)
 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  Developed for liquid stools, concern for skin breakdown  eICU Interventions  Flexi-Seal, no strict contraindications noted     Intervention Category Minor Interventions: Routine modifications to care plan (e.g. PRN medications for pain, fever)  Caniya Tagle 10/22/2023, 11:41 PM

## 2023-10-22 NOTE — Consult Note (Addendum)
 Renal Service Consult Note [facility] Lamar JONETTA Fret, MD  Patient: [name] Date: 10/22/2023 Requesting Physician: Dr. Theodoro  Reason for Consult: Renal failure HPI: The patient is a 78 y.o. year-old w/ PMH as below who presented to ED from local SNF on 10/01 due to SOB, low SpO2 sats at SNF, possibly AMS. Per EMS had rales/ rhonchi, tachypneic. Pt rec'd duoneb, IV solumedrol and 250 NS bolus. In ED BP  142/56, HR 106, RR 34, [place]  6L O2, temp 97.2. CXR showed R lower lobe cavitation concerning for infection vs malignancy. Hx of SCCa of the tongue. IV vanc and zosyn were started. Pt was admitted to progressive unit. Creat on presentation was 1.48, then 1.2, then 1.7, then 2.2 yest and 2.9 today. We are asked to see for renal failure.    Pt seen in ICU. Pt is on vent, sedated, no hx obtained.    ROS - n/a   Past Medical History  Past Medical History:  Diagnosis Date   Asthma    Cancer (HCC)    Depression    Hypertension    Peripheral vascular disease    Pneumonia    Sleep apnea    Stroke [facility])    Past Surgical History  Past Surgical History:  Procedure Laterality Date   BIOPSY  08/16/2021   Procedure: BIOPSY;  Surgeon: Elicia Claw, MD;  Location: [facility] ENDOSCOPY;  Service: Gastroenterology;;   COLONOSCOPY WITH PROPOFOL  N/A 08/16/2021   Procedure: COLONOSCOPY WITH PROPOFOL ;  Surgeon: Elicia Claw, MD;  Location: [facility] ENDOSCOPY;  Service: Gastroenterology;  Laterality: N/A;   DIRECT LARYNGOSCOPY Left 06/29/2016   Procedure: WIDE EXCISION LEFT TONGUE CANCER DIRECT LARYNGOSCOPY;  Surgeon: Arlana Arnt, MD;  Location: [facility] OR;  Service: ENT;  Laterality: Left;   ESOPHAGOGASTRODUODENOSCOPY (EGD) WITH PROPOFOL  Left 07/12/2018   Procedure: ESOPHAGOGASTRODUODENOSCOPY (EGD) WITH PROPOFOL ;  Surgeon: Dianna Specking, MD;  Location: [facility] ENDOSCOPY;  Service: Endoscopy;  Laterality: Left;   ESOPHAGOGASTRODUODENOSCOPY (EGD) WITH PROPOFOL  N/A 08/16/2021    Procedure: ESOPHAGOGASTRODUODENOSCOPY (EGD) WITH PROPOFOL ;  Surgeon: Elicia Claw, MD;  Location: [facility] ENDOSCOPY;  Service: Gastroenterology;  Laterality: N/A;   ESOPHAGOSCOPY N/A 06/29/2016   Procedure: ESOPHAGOSCOPY/BRONCHOSCOPY;  Surgeon: Arlana Arnt, MD;  Location: [facility] OR;  Service: ENT;  Laterality: N/A;   EYE SURGERY Bilateral 2016   HEMOSTASIS CLIP PLACEMENT  08/16/2021   Procedure: HEMOSTASIS CLIP PLACEMENT;  Surgeon: Elicia Claw, MD;  Location: [facility] ENDOSCOPY;  Service: Gastroenterology;;   HOT HEMOSTASIS N/A 07/12/2018   Procedure: HOT HEMOSTASIS (ARGON PLASMA COAGULATION/BICAP);  Surgeon: Dianna Specking, MD;  Location: [facility] ENDOSCOPY;  Service: Endoscopy;  Laterality: N/A;   HOT HEMOSTASIS N/A 08/16/2021   Procedure: HOT HEMOSTASIS (ARGON PLASMA COAGULATION/BICAP);  Surgeon: Elicia Claw, MD;  Location: [facility] ENDOSCOPY;  Service: Gastroenterology;  Laterality: N/A;   KNEE SURGERY Right 2014   LOOP RECORDER INSERTION N/A 03/21/2016   Procedure: Loop Recorder Insertion;  Surgeon: Will Gladis Norton, MD;  Location: [facility] INVASIVE CV LAB;  Service: Cardiovascular;  Laterality: N/A;   PARS PLANA VITRECTOMY Right 02/28/2014   Procedure: PARS PLANA VITRECTOMY 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 WITH TAP AND ANTIBIOTIC INJECTION;  Surgeon: Arley DELENA Ruder, MD;  Location: [facility] OR;  Service: Ophthalmology;  Laterality: Right;   POLYPECTOMY  08/16/2021   Procedure: POLYPECTOMY;  Surgeon: Elicia Claw, MD;  Location: [facility] ENDOSCOPY;  Service: Gastroenterology;;   SCLEROTHERAPY  08/16/2021   Procedure: SCLEROTHERAPY;  Surgeon: Elicia Claw, MD;  Location: [facility] ENDOSCOPY;  Service: Gastroenterology;;   TEE WITHOUT CARDIOVERSION  N/A 03/21/2016   Procedure: TRANSESOPHAGEAL ECHOCARDIOGRAM (TEE);  Surgeon: Annabella Scarce, MD;  Location: [facility] ENDOSCOPY;  Service: Cardiovascular;  Laterality: N/A;   Family History  Family History  Problem Relation Age of Onset   Cancer Mother        colon   Stroke Father     Heart disease Brother    Social History  reports that she quit smoking about 7 years ago. She has never used smokeless tobacco. She reports that she does not drink alcohol  and does not use drugs. Allergies  Allergies  Allergen Reactions   Codeine Itching and Other (See Comments)    Caused mouth to break out in hives   Neosporin [Neomycin-Bacitracin Zn-Polymyx] Itching and Swelling    Swelling at application site   Percocet [Oxycodone -Acetaminophen ] Other (See Comments)    B-blockers, triggers depression   Tussionex Pennkinetic Er [Hydrocod Poli-Chlorphe Poli Er] Itching   Home medications Prior to Admission medications   Medication Sig Start Date End Date Taking? Authorizing Provider  amLODipine  (NORVASC ) 10 MG tablet Take 1 tablet (10 mg total) by mouth daily. 05/09/18  Yes Ricky Fines, MD  atorvastatin  (LIPITOR) 10 MG tablet Take 10 mg by mouth daily.  03/16/16  Yes [provider]  clopidogrel  (PLAVIX ) 75 MG tablet Take 1 tablet (75 mg total) by mouth daily. 07/17/18  Yes Gherghe, Costin M, MD  escitalopram  (LEXAPRO ) 20 MG tablet Take 20 mg by mouth daily.  07/10/14  Yes [provider]  ferrous sulfate  325 (65 FE) MG tablet Take 1 tablet (325 mg total) by mouth daily with breakfast. Patient taking differently: Take 325 mg by mouth every other day. Monday, Wednesday, Friday 07/13/18  Yes Gherghe, Costin M, MD  losartan  (COZAAR ) 25 MG tablet Take 25 mg by mouth daily. 09/29/23  Yes [provider]  pantoprazole  (PROTONIX ) 40 MG tablet Take 1 tablet (40 mg total) by mouth daily. 07/13/18  Yes Gherghe, Costin M, MD  vitamin E 180 MG (400 UNITS) capsule Take 400 Units by mouth daily.   Yes [provider]     Vitals:   10/22/23 0933 10/22/23 1011 10/22/23 1152 10/22/23 1221  BP: (!) 166/71 (!) 119/56  (!) 108/54  Pulse: 100 83  79  Resp: (!) 26 16  (!) 22  Temp:   97.7 F (36.5 C)   TempSrc:   Oral   SpO2: 95% 97%  96%  Weight:      Height:        Exam Gen on vent, sedated, elderly WF Sclera anicteric, throat w/ ETT No jvd or bruits Chest clear anterior/ lateral RRR no MRG Abd soft ntnd no mass or ascites +bs GU foley w/ small amts or yellowish urine  Ext no LE edema, 1+ bilat UE edema Neuro is on vent, sedated    Home bp meds: Norvasc  10 every day Losartan  25 every day   Date   Creat  eGFR (ml/min) 2011- 2020  0.83- 1.17 2023   1.07- 1.34 41- 53 ml/min 10/01   1.48    10/02   1.27 10/03   1.70 10/04   2.29 10/22/23  2.97  IP meds:  IV lopressor 10/02 Metoprolol po 25 bid 10/02 - current PPI IV - ongoing IV zosyn 10/01 - current IV vanc - 10/01- 10/03, dc'd IV iohexol  contrast - 60 cc on 10/01 BP's -> BP's stable 10/01 and 10/02 BP's soft 90s- 100s from 10/2- 10/03 BP's 100s- 110s 10/4 to today   UA  10/1 - small Hb, 20 ketone, 100 prot, neg LE, 0-5 rbc/wbc/epi UA  10/5 - mod Hb, trace LE, prot 100 UNa < 30, UCr 52 Renal US : 11.2/ 9.8 cm kidneys w/ no hydro bilat CXR: IMPRESSION: bilateral lower lung zone patchy airspace opacities with bilateral, right greater than left, trace to small volume pleural effusions. Labs today: Na 135, K+ 5.1, CO2  20, BUN 73, creat 2.97, alb 1.9, wBC 17 --> 37K, Hb 8.2  UOP: 215 cc on 10/3 and 330 cc on 10/4    Assessment/ Plan: AKI on CKD 3a: b/l creatinine 1.1- 1.4 from 2023, eGFR 41- 53 ml/min. Creatinine was 1.4 on admit, since then has risen up to 2.2 yesterday and 2.9 today. Renal US  shows no obstruction. UA showed no rbc/ wbc's, mild proteinuria. UNa was low suggesting pre-renal. She rec'd IV vanc/ zosyn for 1st 2-3 days, then vanc dc'd. Also had IV contrast w/ CT scan on 10/01, and some borderline hypotension for about 48 hrs which then improved. Suspect AKI due to contrast nephropathy +/- IV vanc/ zosyn +/- borderline hypotension. May have ATN. UOP is marginal at 300- 500 cc/day.  IV vanc was already dc'd. Will dc metoprolol 25 bid for now as bp's aren't great and  she is NSR. Will give NS 0.9% at 65 cc/hr overnight. Otherwise recommend supportive care, no indication for RRT at this time. We will follow.  Volume: total I/O's are 5 L +, wt's up 1kg since admit. Not grossly overloaded on exam. IVF's as above.  PNA/ sepsis: per CCM, grew staph aureus in sputum culture Atrial fib: in NSR here H/o CVA       Melissa Fret  MD [facility] 10/22/2023, 12:54 PM  Recent Labs  Lab 10/21/23 0239 10/22/23 0235  CREATININE 2.29* 2.97*  K 3.4* 5.1   Inpatient medications:  aspirin   81 mg Per Tube Daily   Or   aspirin  EC  81 mg Oral Daily   atorvastatin   10 mg Per Tube Daily   budesonide (PULMICORT) nebulizer solution  0.25 mg Nebulization BID   Chlorhexidine  Gluconate Cloth  6 each Topical Daily   docusate  100 mg Per Tube BID   escitalopram   20 mg Per Tube Daily   feeding supplement (PROSource TF20)  60 mL Per Tube Daily   insulin aspart  0-9 Units Subcutaneous Q4H   ipratropium-albuterol   3 mL Nebulization QID   methylPREDNISolone (SOLU-MEDROL) injection  80 mg Intravenous Daily   metoprolol tartrate  25 mg Per Tube BID   mouth rinse  15 mL Mouth Rinse Q2H   pantoprazole  (PROTONIX ) IV  40 mg Intravenous QHS   polyethylene glycol  17 g Per Tube Daily   sodium chloride  flush  3 mL Intravenous Q12H    feeding supplement (OSMOLITE 1.2 CAL) 30 mL/hr at 10/22/23 0600   heparin 850 Units/hr (10/22/23 0600)   linezolid (ZYVOX) IV 600 mg (10/22/23 0939)   piperacillin-tazobactam (ZOSYN)  IV Stopped (10/22/23 0216)   propofol  (DIPRIVAN ) infusion 30 mcg/kg/min (10/22/23 0603)   acetaminophen  **OR** acetaminophen , albuterol , fentaNYL  (SUBLIMAZE ) injection, midazolam , ondansetron  **OR** ondansetron  (ZOFRAN ) IV, mouth rinse

## 2023-10-22 NOTE — Progress Notes (Signed)
 eLink Physician-Brief Progress Note Patient Name: [name] DOB: 1945/08/22 MRN: [id number]   Date of Service  10/22/2023  HPI/Events of Note  AKI with ongoing oliguria despite fluid resuscitation, MAP down trending  eICU Interventions  LR bolus 500cc, ongoing TF @50cc /hr May need pressors     Intervention Category Intermediate Interventions: Oliguria - evaluation and management  Terriana Barreras 10/22/2023, 3:03 AM

## 2023-10-22 NOTE — Progress Notes (Signed)
 PHARMACY - ANTICOAGULATION CONSULT NOTE  Pharmacy Consult for Heparin infusion Indication: atrial fibrillation  Allergies  Allergen Reactions   Codeine Itching and Other (See Comments)    Caused mouth to break out in hives   Neosporin [Neomycin-Bacitracin Zn-Polymyx] Itching and Swelling    Swelling at application site   Percocet [Oxycodone -Acetaminophen ] Other (See Comments)    B-blockers, triggers depression   Tussionex Pennkinetic Er [Hydrocod Poli-Chlorphe Poli Er] Itching    Patient Measurements: Height: 5' (152.4 cm) Weight: 57.1 kg (125 lb 14.1 oz) IBW/kg (Calculated) : 45.5 HEPARIN DW (KG): 56.7  Vital Signs: Temp: 97.7 F (36.5 C) (10/05 0817) Temp Source: Oral (10/05 0817) BP: 166/71 (10/05 0932) Pulse Rate: 100 (10/05 0932)  Labs: Recent Labs    10/20/23 0412 10/20/23 1133 10/20/23 1705 10/21/23 0239 10/22/23 0235  HGB 8.5* 8.8*  --  8.3* 8.2*  HCT 27.6* 26.0*  --  26.6* 26.6*  PLT 450*  --   --  452* 509*  HEPARINUNFRC 0.24*  --  0.36 0.29* 0.34  CREATININE 1.70*  --   --  2.29* 2.97*    Estimated Creatinine Clearance: 12.1 mL/min (A) (by C-G formula based on SCr of 2.97 mg/dL (H)).   Medical History: Past Medical History:  Diagnosis Date   Asthma    Cancer (HCC)    Depression    Hypertension    Peripheral vascular disease    Pneumonia    Sleep apnea    Stroke (HCC)     Medications:  Medications Prior to Admission  Medication Sig Dispense Refill Last Dose/Taking   amLODipine  (NORVASC ) 10 MG tablet Take 1 tablet (10 mg total) by mouth daily.   10/17/2023   atorvastatin  (LIPITOR) 10 MG tablet Take 10 mg by mouth daily.   3 10/17/2023   clopidogrel  (PLAVIX ) 75 MG tablet Take 1 tablet (75 mg total) by mouth daily. 30 tablet 1 10/17/2023   escitalopram  (LEXAPRO ) 20 MG tablet Take 20 mg by mouth daily.   5 10/17/2023   ferrous sulfate  325 (65 FE) MG tablet Take 1 tablet (325 mg total) by mouth daily with breakfast. (Patient taking differently: Take  325 mg by mouth every other day. Monday, Wednesday, Friday) 90 tablet 1 10/17/2023   losartan  (COZAAR ) 25 MG tablet Take 25 mg by mouth daily.   10/17/2023   pantoprazole  (PROTONIX ) 40 MG tablet Take 1 tablet (40 mg total) by mouth daily. 90 tablet 1 10/17/2023   vitamin E 180 MG (400 UNITS) capsule Take 400 Units by mouth daily.   10/17/2023    Assessment: 78 YOF. New onset Afib. Not on anticoagulation at home. Hx of CVA in 2018. Note history of GI bleed secondary to AVMs in past.   Heparin level is therapeutic at 0.34 on current rate of 850 units/hr.  Hgb down some at 8.2. Platelets remain elevated. No bleeding noted.  No issues with the infusion.  SCr continues to trending up at 2.97 with decreased CrCl at ~12 ml/min.   Goal of Therapy:  Heparin level 0.3-0.7 units/ml Monitor platelets by anticoagulation protocol: Yes   Plan:  Continue heparin to 850 units / hr Daily heparin level and CBC Monitor for accumulation with worsening renal function  Harlene Boga, PharmD, BCPS, BCCCP Clinical Pharmacist Please refer to AMION for [facility] Pharmacy numbers 10/22/2023  9:40 AM .

## 2023-10-22 NOTE — Progress Notes (Addendum)
 NAME:  [name], MRN:  [id number], DOB:  11-26-1945, LOS: 4 ADMISSION DATE:  10/18/2023 CHIEF COMPLAINT:  SOB.    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  Presented from independent living facility after found to have noisy breathing and several days of progressive SOB found to be hypoxic in the 80's.  Pt states has been SOB for 3 days with new dry cough and poor PO intake related to dyspnea.  Denies fever, chills, chest pain, wt loss (although appears to have 19lb wt loss since 01/2023), hemoptysis, dysphagia or choking episodes. Does not use oxygen, nebs/ inhalers at baseline and denies OSA.    Treated in ER with O2, nebs, solumedrol.  Afebrile in ER, requiring 6L.  WBC 18.2, Hgb 9.4, sCr 1.48, ABG 7.414/ 29/ 153/ 18.6.  SARS/ flu/ RSV neg. CXR concerning for RLL caviatry lesion.  Empiric vancomycin  and zosyn started. CTA PE obtained, negative for PE, but extensive abnormality of RLL, RML and basilar aspect of RUE with mass like consolidation in RLL with multiple airway occlusions including endobronchial tumor/ debris and large 8.5 cm cavitary lesion of RLL with communication directly into lower lobe bronchi concerning for extensive pulmonary malignancy, pneumonia remains in differential.  Abnormal bronchovascular distribution within lingula extending into subpleural surface.  Also noted right renal abnormal contour suspicious for solid renal mass. Admitted to [facility] with Pulmonary consulting for further recomendations.   Pertinent  Medical History  former smoker (quit 2018), emphysema, CVA on plavix , HTN, HLD, GERD, anxiety, depression and GIB (AVMs-duodenal, cecal and right colon), OSA (denies), PVD, CKD3a, IDA, squamous cell carcinoma of tongue s/p excision  (2018)  Interim History / Subjective:  Pressure support trial failed as the patient became tachypneic.  Worsening white cell count today along with worsening creatinine. Significant secretions suctioned in the morning.  Significant Hospital Events: 10/1: admitted.  10/2: intubated.   Objective    Blood pressure (!) 108/54, pulse 79, temperature 97.7 F (36.5 C), temperature source Oral, resp. rate (!) 22, height 5' (1.524 m), weight 57.1 kg, SpO2 96%.    Vent Mode: PCV FiO2 (%):  [45 %-60 %] 45 % Set Rate:  [16 bmp-20 bmp] 16 bmp Vt Set:  [400 mL] 400 mL PEEP:  [5 cmH20] 5 cmH20 Pressure Support:  [10 cmH20] 10 cmH20 Plateau Pressure:  [11 cmH20-21 cmH20] 21 cmH20   Intake/Output Summary (Last 24 hours) at 10/22/2023 1306 Last data filed at 10/22/2023 0600 Gross per 24 hour  Intake 1128.47 ml  Output 225 ml  Net 903.47 ml   Filed Weights   10/18/23 0942 10/21/23 0600  Weight: 56.7 kg 57.1 kg    Examination: General: Sedated and intubated, elderly lady. Lungs: No wheezing today.  Diminished breath sounds bilaterally. Heart: regular rate rhythm, no murmur appreciated.  Abdomen: non tender, non distended. Normal BS.  Neuro: Sedated.   Assessment and Plan  Right lower lobe cavitary lung mass with opacity in right middle lobe and left lingula-lung abscess, will need to rule out malignancy:  Former smoker and prior tongue SCC: Hypoxic respiratory failure, mechanical ventilation: COPD exacerbation: - Lung opacity likely malignancy versus lung abscess. - Intubated.  Transition to pressure control mode today. - Continue Vanco and Zosyn> BAL growing Staph aureus.  MRSA negative.  Vancomycin  was  discontinued.  With worsening white cell count today started on Zyvox.  Will continue Zosyn for today.  May stop Zosyn tomorrow. -Solu-Medrol, scheduled DuoNebs, budesonide nebs. - Vent bundle - Will try SBT/SAT tomorrow.  Failed today.  Renal cyst: - Dedicated CT abdomen did not  show any metastatic changes.  However did show change in density of the renal mass compared to before.  - Renal ultrasound performed shows simple renal cyst stable since last 2 years.  AKI on CKD stage IIIb: Anuria/oliguria: -No improvement with IV fluids.  Poor urine output. -Likely ATN.  UA done. - Nephrology consulted. - BMP 3 PM   A-fib: -Echo: EF normal with reduced RV function. - Low-dose metoprolol. - heparin gtt.    Elevated FT4 - started on methimazole for FT4 of 1.32. Will dc.  -Will need TSH and free T4 as an outpatient.  If continued elevation of free T4 will need to be started on methimazole.   Severe hypokalemia: - Replace as needed.   History of CVA: -Aspirin  and statin for now. -Plavix  was changed to aspirin  while being transferred to the ICU if need for procedures. -Heparin drip as above.   GERD PPI.    Updated brother Norleen on 10/3 who lives in [place] .  Updated Rock her neighbor on 10/3.  Tried reaching out Rock today went to voicemail.,  Labs   CBC: Recent Labs  Lab 10/18/23 [phone] 10/18/23 0959 10/19/23 0428 10/19/23 1836 10/20/23 0001 10/20/23 0412 10/20/23 1133 10/21/23 0239 10/22/23 0235  WBC 18.2*  --  17.1*  --   --  19.2*  --  26.5* 37.6*  NEUTROABS 14.1*  --   --   --   --   --   --   --   --   HGB 9.4*   < > 8.9*   < > 9.2* 8.5* 8.8* 8.3* 8.2*  HCT 30.1*   < > 27.6*   < > 27.0* 27.6* 26.0* 26.6* 26.6*  MCV 79.0*  --  77.7*  --   --  80.5  --  79.4* 81.8  PLT 473*  --  394  --   --  450*  --  452* 509*   < > = values in this interval not displayed.    Basic Metabolic Panel: Recent Labs  Lab 10/19/23 0425 10/19/23 0428 10/19/23 0744 10/19/23 1836 10/20/23 0001 10/20/23 0412 10/20/23 1133 10/21/23 0239 10/22/23 0235  NA  --  134* 133*   < > 133* 135 134* 134* 135  K  --  2.9* 3.9   < > 3.8 3.9 3.5 3.4* 5.1  CL  --  101 99  --   --  102  --  99 102  CO2  --  19* 14*  --   --  17*  --  19* 20*  GLUCOSE  --  152* 139*  --    --  143*  --  162* 198*  BUN  --  33* 34*  --   --  40*  --  53* 73*  CREATININE  --  1.19* 1.27*  --   --  1.70*  --  2.29* 2.97*  CALCIUM   --  7.9* 8.0*  --   --  7.9*  --  7.7* 7.7*  MG 2.0  --  2.2  --   --  2.0  --  2.5* 2.4  PHOS  --   --  4.1  --   --  4.9*  --  5.0* 6.9*   < > = values in this interval not displayed.   GFR: Estimated Creatinine Clearance: 12.1 mL/min (A) (by C-G formula based on SCr of 2.97 mg/dL (H)). Recent Labs  Lab 10/18/23 0959 10/18/23 1420 10/19/23 0428 10/19/23 1832 10/20/23 0412 10/21/23 0239 10/22/23 0235  PROCALCITON  --  2.42  --  2.40  --   --   --   WBC  --   --  17.1*  --  19.2* 26.5* 37.6*  LATICACIDVEN 1.3  --   --   --   --   --   --     Liver Function Tests: Recent Labs  Lab 10/18/23 0937 10/19/23 0428  AST 26 24  ALT 13 14  ALKPHOS 107 103  BILITOT 1.3* 0.8  PROT 6.5 5.8*  ALBUMIN 2.1* 1.9*   No results for input(s): LIPASE, AMYLASE in the last 168 hours. No results for input(s): AMMONIA in the last 168 hours.  ABG    Component Value Date/Time   PHART 7.341 (L) 10/20/2023 1133   PCO2ART 34.9 10/20/2023 1133   PO2ART 69 (L) 10/20/2023 1133   HCO3 18.9 (L) 10/20/2023 1133   TCO2 20 (L) 10/20/2023 1133   ACIDBASEDEF 6.0 (H) 10/20/2023 1133   O2SAT 93 10/20/2023 1133      Past Medical History:  She,  has a past medical history of Asthma, Cancer (HCC), Depression, Hypertension, Peripheral vascular disease, Pneumonia, Sleep apnea, and Stroke (HCC).   Surgical History:   Past Surgical History:  Procedure Laterality Date   BIOPSY  08/16/2021   Procedure: BIOPSY;  Surgeon: Elicia Claw, MD;  Location: [facility] ENDOSCOPY;  Service: Gastroenterology;;   COLONOSCOPY WITH PROPOFOL  N/A 08/16/2021   Procedure: COLONOSCOPY WITH PROPOFOL ;  Surgeon: Elicia Claw, MD;  Location: [facility] ENDOSCOPY;  Service: Gastroenterology;  Laterality: N/A;   DIRECT LARYNGOSCOPY Left 06/29/2016   Procedure: WIDE EXCISION LEFT TONGUE CANCER  DIRECT LARYNGOSCOPY;  Surgeon: Arlana Arnt, MD;  Location[facility] OR;  Service: ENT;  Laterality: Left;   ESOPHAGOGASTRODUODENOSCOPY (EGD) WITH PROPOFOL  Left 07/12/2018   Procedure: ESOPHAGOGASTRODUODENOSCOPY (EGD) WITH PROPOFOL ;  Surgeon: Dianna Specking, MD;  Location: [facility] ENDOSCOPY;  Service: Endoscopy;  Laterality: Left;   ESOPHAGOGASTRODUODENOSCOPY (EGD) WITH PROPOFOL  N/A 08/16/2021   Procedure: ESOPHAGOGASTRODUODENOSCOPY (EGD) WITH PROPOFOL ;  Surgeon: Elicia Claw, MD;  Location: [facility] ENDOSCOPY;  Service: Gastroenterology;  Laterality: N/A;   ESOPHAGOSCOPY N/A 06/29/2016   Procedure: ESOPHAGOSCOPY/BRONCHOSCOPY;  Surgeon: Arlana Arnt, MD;  Location: [facility] OR;  Service: ENT;  Laterality: N/A;   EYE SURGERY Bilateral 2016   HEMOSTASIS CLIP PLACEMENT  08/16/2021   Procedure: HEMOSTASIS CLIP PLACEMENT;  Surgeon: Elicia Claw, MD;  Location: [facility] ENDOSCOPY;  Service: Gastroenterology;;   HOT HEMOSTASIS N/A 07/12/2018   Procedure: HOT HEMOSTASIS (ARGON PLASMA COAGULATION/BICAP);  Surgeon: Dianna Specking, MD;  Location: [facility] ENDOSCOPY;  Service: Endoscopy;  Laterality: N/A;   HOT HEMOSTASIS N/A 08/16/2021   Procedure: HOT HEMOSTASIS (ARGON PLASMA COAGULATION/BICAP);  Surgeon: Elicia Claw, MD;  Location: [facility] ENDOSCOPY;  Service: Gastroenterology;  Laterality: N/A;   KNEE SURGERY Right 2014   LOOP RECORDER INSERTION N/A 03/21/2016   Procedure: Loop Recorder Insertion;  Surgeon: Will Gladis Norton, MD;  Location: [facility] INVASIVE CV LAB;  Service: Cardiovascular;  Laterality: N/A;   PARS PLANA VITRECTOMY Right 02/28/2014   Procedure: PARS PLANA VITRECTOMY 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 WITH TAP AND ANTIBIOTIC INJECTION;  Surgeon: Arley DELENA Ruder, MD;  Location: [facility] OR;  Service: Ophthalmology;  Laterality: Right;   POLYPECTOMY  08/16/2021  Procedure: POLYPECTOMY;  Surgeon: Elicia Claw, MD;  Location: [facility] ENDOSCOPY;  Service: Gastroenterology;;   SCLEROTHERAPY  08/16/2021   Procedure: MATIAS;   Surgeon: Elicia Claw, MD;  Location: [facility] ENDOSCOPY;  Service: Gastroenterology;;   TEE WITHOUT CARDIOVERSION N/A 03/21/2016   Procedure: TRANSESOPHAGEAL ECHOCARDIOGRAM (TEE);  Surgeon: Annabella Scarce, MD;  Location: [facility] ENDOSCOPY;  Service: Cardiovascular;  Laterality: N/A;     Social History:   reports that she quit smoking about 7 years ago. She has never used smokeless tobacco. She reports that she does not drink alcohol  and does not use drugs.   Family History:  Her family history includes Cancer in her mother; Heart disease in her brother; Stroke in her father.   Allergies Allergies  Allergen Reactions   Codeine Itching and Other (See Comments)    Caused mouth to break out in hives   Neosporin [Neomycin-Bacitracin Zn-Polymyx] Itching and Swelling    Swelling at application site   Percocet [Oxycodone -Acetaminophen ] Other (See Comments)    B-blockers, triggers depression   Tussionex Pennkinetic Er [Hydrocod Poli-Chlorphe Poli Er] Itching     Home Medications  Prior to Admission medications   Medication Sig Start Date End Date Taking? Authorizing Provider  amLODipine  (NORVASC ) 10 MG tablet Take 1 tablet (10 mg total) by mouth daily. 05/09/18  Yes Ricky Fines, MD  atorvastatin  (LIPITOR) 10 MG tablet Take 10 mg by mouth daily.  03/16/16  Yes [provider]  clopidogrel  (PLAVIX ) 75 MG tablet Take 1 tablet (75 mg total) by mouth daily. 07/17/18  Yes Gherghe, Costin M, MD  escitalopram  (LEXAPRO ) 20 MG tablet Take 20 mg by mouth daily.  07/10/14  Yes [provider]  ferrous sulfate  325 (65 FE) MG tablet Take 1 tablet (325 mg total) by mouth daily with breakfast. Patient taking differently: Take 325 mg by mouth every other day. Monday, Wednesday, Friday 07/13/18  Yes Gherghe, Costin M, MD  losartan  (COZAAR ) 25 MG tablet Take 25 mg by mouth daily. 09/29/23  Yes [provider]  pantoprazole  (PROTONIX ) 40 MG tablet Take 1 tablet (40 mg total) by mouth daily.  07/13/18  Yes Gherghe, Costin M, MD  vitamin E 180 MG (400 UNITS) capsule Take 400 Units by mouth daily.   Yes [provider]     CRITICAL CARE Performed by: Sammi JONETTA Fredericks.     Total critical care time: 45 minutes   Critical care time was exclusive of separately billable procedures and treating other patients.   Critical care was necessary to treat or prevent imminent or life-threatening deterioration.   Critical care was time spent personally by me on the following activities: development of treatment plan with patient and/or surrogate as well as nursing, discussions with consultants, evaluation of patient's response to treatment, examination of patient, obtaining history from patient or surrogate, ordering and performing treatments and interventions, ordering and review of laboratory studies, ordering and review of radiographic studies, pulse oximetry, re-evaluation of patient's condition and participation in multidisciplinary rounds.  Sammi JONETTA Fredericks, MD Pulmonary, Critical Care and Sleep Attending.  Pager: [phone]  10/22/2023, 1:06 PM

## 2023-10-23 ENCOUNTER — Inpatient Hospital Stay (HOSPITAL_COMMUNITY)

## 2023-10-23 LAB — CBC
HCT: 24.6 % — ABNORMAL LOW (ref 36.0–46.0)
HCT: 25.7 % — ABNORMAL LOW (ref 36.0–46.0)
Hemoglobin: 7.6 g/dL — ABNORMAL LOW (ref 12.0–15.0)
Hemoglobin: 7.9 g/dL — ABNORMAL LOW (ref 12.0–15.0)
MCH: 25.2 pg — ABNORMAL LOW (ref 26.0–34.0)
MCH: 25.2 pg — ABNORMAL LOW (ref 26.0–34.0)
MCHC: 30.9 g/dL (ref 30.0–36.0)
MCHC: 30.7 g/dL (ref 30.0–36.0)
MCV: 81.5 fL (ref 80.0–100.0)
MCV: 82.1 fL (ref 80.0–100.0)
Platelets: 415 K/uL — ABNORMAL HIGH (ref 150–400)
Platelets: 409 K/uL — ABNORMAL HIGH (ref 150–400)
RBC: 3.02 MIL/uL — ABNORMAL LOW (ref 3.87–5.11)
RBC: 3.13 MIL/uL — ABNORMAL LOW (ref 3.87–5.11)
RDW: 16.9 % — ABNORMAL HIGH (ref 11.5–15.5)
RDW: 17.2 % — ABNORMAL HIGH (ref 11.5–15.5)
WBC: 39.4 K/uL — ABNORMAL HIGH (ref 4.0–10.5)
WBC: 46.3 K/uL — ABNORMAL HIGH (ref 4.0–10.5)
nRBC: 0.1 % (ref 0.0–0.2)
nRBC: 0.1 % (ref 0.0–0.2)

## 2023-10-23 LAB — CULTURE, BLOOD (ROUTINE X 2)
Culture: NO GROWTH
Culture: NO GROWTH
Special Requests: ADEQUATE
Special Requests: ADEQUATE

## 2023-10-23 LAB — BASIC METABOLIC PANEL WITH GFR
Anion gap: 17 — ABNORMAL HIGH (ref 5–15)
BUN: 87 mg/dL — ABNORMAL HIGH (ref 8–23)
CO2: 17 mmol/L — ABNORMAL LOW (ref 22–32)
Calcium: 7.2 mg/dL — ABNORMAL LOW (ref 8.9–10.3)
Chloride: 98 mmol/L (ref 98–111)
Creatinine, Ser: 3.19 mg/dL — ABNORMAL HIGH (ref 0.44–1.00)
GFR, Estimated: 14 mL/min — ABNORMAL LOW (ref >60)
Glucose, Bld: 190 mg/dL — ABNORMAL HIGH (ref 70–99)
Potassium: 4.3 mmol/L (ref 3.5–5.1)
Sodium: 132 mmol/L — ABNORMAL LOW (ref 135–145)

## 2023-10-23 LAB — GLUCOSE, CAPILLARY
Glucose-Capillary: 125 mg/dL — ABNORMAL HIGH (ref 70–99)
Glucose-Capillary: 151 mg/dL — ABNORMAL HIGH (ref 70–99)
Glucose-Capillary: 159 mg/dL — ABNORMAL HIGH (ref 70–99)
Glucose-Capillary: 165 mg/dL — ABNORMAL HIGH (ref 70–99)
Glucose-Capillary: 194 mg/dL — ABNORMAL HIGH (ref 70–99)
Glucose-Capillary: 202 mg/dL — ABNORMAL HIGH (ref 70–99)

## 2023-10-23 LAB — UREA NITROGEN, URINE: Urea Nitrogen, Ur: 300 mg/dL

## 2023-10-23 LAB — MAGNESIUM: Magnesium: 2.3 mg/dL (ref 1.7–2.4)

## 2023-10-23 LAB — TRIGLYCERIDES: Triglycerides: 91 mg/dL (ref <150)

## 2023-10-23 LAB — HEPARIN LEVEL (UNFRACTIONATED)
Heparin Unfractionated: 0.27 [IU]/mL — ABNORMAL LOW (ref 0.30–0.70)
Heparin Unfractionated: 0.25 [IU]/mL — ABNORMAL LOW (ref 0.30–0.70)

## 2023-10-23 LAB — PHOSPHORUS: Phosphorus: 5.9 mg/dL — ABNORMAL HIGH (ref 2.5–4.6)

## 2023-10-23 MED ORDER — FOLIC ACID 1 MG PO TABS
1.0000 mg | ORAL_TABLET | Freq: Every day | ORAL | Status: DC
  Administered 2023-10-23 – 2023-10-26 (×4): 1 mg
  Filled 2023-10-23 (×4): qty 1

## 2023-10-23 MED ORDER — DEXMEDETOMIDINE HCL IN NACL 400 MCG/100ML IV SOLN
0.0000 ug/kg/h | INTRAVENOUS | Status: DC
  Administered 2023-10-23: 1.2 ug/kg/h via INTRAVENOUS
  Administered 2023-10-23: 0.4 ug/kg/h via INTRAVENOUS
  Administered 2023-10-24: 0.7 ug/kg/h via INTRAVENOUS
  Filled 2023-10-23 (×3): qty 100

## 2023-10-23 MED ORDER — ADULT MULTIVITAMIN W/MINERALS CH
1.0000 | ORAL_TABLET | Freq: Every day | ORAL | Status: DC
  Administered 2023-10-23 – 2023-10-24 (×2): 1
  Filled 2023-10-23 (×2): qty 1

## 2023-10-23 MED ORDER — INSULIN ASPART 100 UNIT/ML IJ SOLN
0.0000 [IU] | INTRAMUSCULAR | Status: DC
  Administered 2023-10-23: 4 [IU] via SUBCUTANEOUS
  Administered 2023-10-23: 5 [IU] via SUBCUTANEOUS
  Administered 2023-10-24: 2 [IU] via SUBCUTANEOUS
  Administered 2023-10-24: 3 [IU] via SUBCUTANEOUS
  Administered 2023-10-24: 2 [IU] via SUBCUTANEOUS
  Administered 2023-10-24: 3 [IU] via SUBCUTANEOUS
  Administered 2023-10-24: 2 [IU] via SUBCUTANEOUS
  Administered 2023-10-24: 3 [IU] via SUBCUTANEOUS
  Administered 2023-10-24 – 2023-10-25 (×3): 2 [IU] via SUBCUTANEOUS
  Administered 2023-10-25: 3 [IU] via SUBCUTANEOUS
  Administered 2023-10-25 (×2): 2 [IU] via SUBCUTANEOUS
  Administered 2023-10-26 (×5): 3 [IU] via SUBCUTANEOUS

## 2023-10-23 NOTE — Progress Notes (Signed)
 PHARMACY - ANTICOAGULATION CONSULT NOTE  Pharmacy Consult for Heparin infusion Indication: atrial fibrillation  Allergies  Allergen Reactions   Codeine Itching and Other (See Comments)    Caused mouth to break out in hives   Neosporin [Neomycin-Bacitracin Zn-Polymyx] Itching and Swelling    Swelling at application site   Percocet [Oxycodone -Acetaminophen ] Other (See Comments)    B-blockers, triggers depression   Tussionex Pennkinetic Er [Hydrocod Poli-Chlorphe Poli Er] Itching    Patient Measurements: Height: 5' (152.4 cm) Weight: 57.1 kg (125 lb 14.1 oz) IBW/kg (Calculated) : 45.5 HEPARIN DW (KG): 56.7  Vital Signs: Temp: 98.5 F (36.9 C) (10/06 1950) Temp Source: Axillary (10/06 1950) BP: 187/98 (10/06 2030) Pulse Rate: 131 (10/06 2030)  Labs: Recent Labs    10/21/23 0239 10/22/23 0235 10/22/23 1528 10/23/23 0255 10/23/23 1940  HGB 8.3* 8.2*  --  7.6*  --   HCT 26.6* 26.6*  --  24.6*  --   PLT 452* 509*  --  415*  --   HEPARINUNFRC 0.29* 0.34  --  0.27* 0.25*  CREATININE 2.29* 2.97* 3.20* 3.19*  --     Estimated Creatinine Clearance: 11.3 mL/min (A) (by C-G formula based on SCr of 3.19 mg/dL (H)).   Assessment: 78 YOF. New onset Afib. Not on anticoagulation at home. Hx of CVA in 2018. Note history of GI bleed secondary to AVMs in past.   10/6 PM: HL 0.25 subtherapeutic with heparin running at 900 units/hour. No signs of bleeding or issues with the heparin infusion noted. RN does note that patient has had liquid dark colored stools, not overtly bloody, but will titrate cautiously and recheck CBC.   Goal of Therapy:  Heparin level 0.3-0.7 units/ml Monitor platelets by anticoagulation protocol: Yes   Plan:  Increase heparin to 950 units / hr CBC STAT  Daily heparin level and CBC Monitor for accumulation with worsening renal function  Massie Fila, PharmD Clinical Pharmacist  10/23/2023 9:01 PM

## 2023-10-23 NOTE — TOC Progression Note (Signed)
 Transition of Care [facility]) - Progression Note    Patient Details  Name: [name] MRN: [id number] Date of Birth: 1945-03-17  [facility] CM/SW Contact  Lauraine FORBES Saa, LCSWA Phone Number: 10/23/2023, 12:26 PM  Clinical Narrative:     12:26 PM Per progressions, patient remains intubated with NG/OG tube. TOC will continue to follow.  Expected Discharge Plan: Home/Self Care Barriers to Discharge: Continued Medical Work up               Expected Discharge Plan and Services   Discharge Planning Services: CM Consult   Living arrangements for the past 2 months: Independent Living Facility                                       Social Drivers of Health (SDOH) Interventions SDOH Screenings   Food Insecurity: No Food Insecurity (10/18/2023)  Housing: Low Risk  (10/18/2023)  Transportation Needs: No Transportation Needs (10/18/2023)  Utilities: Not At Risk (10/18/2023)  Social Connections: Socially Isolated (10/18/2023)  Tobacco Use: Medium Risk (10/18/2023)    Readmission Risk Interventions     No data to display

## 2023-10-23 NOTE — Progress Notes (Signed)
 [facility] Progress Note   Subjective:   Remains intubated and sedated.  No pressor support.   UOP yesterday. Net neg yesterday, + 4.4L for admission.   Objective Vitals:   10/23/23 0600 10/23/23 0630 10/23/23 0700 10/23/23 0739  BP: (!) 145/71 (!) 136/57 (!) 140/58   Pulse: 83 82 84   Resp: 18 19 19    Temp:    97.8 F (36.6 C)  TempSrc:    Axillary  SpO2: 96% 97% 97%   Weight:      Height:       Physical Exam Gen on vent, sedated, elderly WF Sclera anicteric, throat w/ ETT No jvd or bruits Chest clear anterior/ lateral RRR no MRG Abd soft ntnd no mass or ascites +bs GU foley with clear yellow urine at decent volume Ext no LE edema, 1+ bilat UE edema Neuro is on vent, sedated  Additional Objective Labs: Basic Metabolic Panel: Recent Labs  Lab 10/21/23 0239 10/22/23 0235 10/22/23 1528 10/22/23 2117 10/23/23 0255  NA 134* 135 133*  --  132*  K 3.4* 5.1 5.6* 4.9 4.3  CL 99 102 100  --  98  CO2 19* 20* 18*  --  17*  GLUCOSE 162* 198* 201*  --  190*  BUN 53* 73* 83*  --  87*  CREATININE 2.29* 2.97* 3.20*  --  3.19*  CALCIUM  7.7* 7.7* 7.5*  --  7.2*  PHOS 5.0* 6.9*  --   --  5.9*   Liver Function Tests: Recent Labs  Lab 10/18/23 0937 10/19/23 0428  AST 26 24  ALT 13 14  ALKPHOS 107 103  BILITOT 1.3* 0.8  PROT 6.5 5.8*  ALBUMIN 2.1* 1.9*   No results for input(s): LIPASE, AMYLASE in the last 168 hours. CBC: Recent Labs  Lab 10/18/23 0937 10/18/23 0959 10/19/23 0428 10/19/23 1836 10/20/23 0412 10/20/23 1133 10/21/23 0239 10/22/23 0235 10/23/23 0255  WBC 18.2*  --  17.1*  --  19.2*  --  26.5* 37.6* 39.4*  NEUTROABS 14.1*  --   --   --   --   --   --   --   --   HGB 9.4*   < > 8.9*   < > 8.5*   < > 8.3* 8.2* 7.6*  HCT 30.1*   < > 27.6*   < > 27.6*   < > 26.6* 26.6* 24.6*  MCV 79.0*  --  77.7*  --  80.5  --  79.4* 81.8 81.5  PLT 473*  --  394  --  450*  --  452* 509* 415*   < > = values in this interval not  displayed.   Blood Culture    Component Value Date/Time   SDES TRACHEAL ASPIRATE 10/22/2023 0851   SPECREQUEST NONE 10/22/2023 0851   CULT PENDING 10/22/2023 0851   REPTSTATUS PENDING 10/22/2023 0851    Cardiac Enzymes: No results for input(s): CKTOTAL, CKMB, CKMBINDEX, TROPONINI in the last 168 hours. CBG: Recent Labs  Lab 10/22/23 1812 10/22/23 1946 10/22/23 2314 10/23/23 0344 10/23/23 0741  GLUCAP 222* 185* 206* 159* 151*   Iron Studies: No results for input(s): IRON, TIBC, TRANSFERRIN, FERRITIN in the last 72 hours. @lablastinr3 @ Studies/Results: US  RENAL Result Date: 10/21/2023 CLINICAL DATA:  Kidney mass. EXAM: RENAL / URINARY TRACT ULTRASOUND COMPLETE COMPARISON:  CT scan from 1 day prior. FINDINGS: Right Kidney: Renal measurements: 11.3 x 6.0 x 5.0 cm = volume: 178 mL. 3.0 x 2.7 x 2.8 cm  simple cyst is identified in interpolar right kidney. This was present on a CT scan from 08/06/2021 measuring 2.9 x 2.7 x 2.6 cm at that time. Left Kidney: Renal measurements: 9.8 x 5.5 x 4.7 cm = volume: 125 mL. 2.7 x 2.7 x 2.7 cm simple cyst identified in the lower pole region, slightly progressive since previous CT of 2023. Bladder: Decompressed by Foley catheter. Other: Probable punctate nonobstructing stones right kidney. IMPRESSION: 1. Right renal cyst of concern on yesterday's CT scan has simple imaging features by ultrasound today in shows no substantial progression in the 2 year interval since the prior study, most consistent with benign etiology. 2. Probable punctate nonobstructing stones right kidney. Electronically Signed   By: Camellia Candle M.D.   On: 10/21/2023 09:21   Medications:  sodium chloride  65 mL/hr at 10/23/23 0719   feeding supplement (OSMOLITE 1.2 CAL) 50 mL/hr at 10/23/23 0719   heparin 850 Units/hr (10/23/23 0719)   linezolid (ZYVOX) IV Stopped (10/23/23 0203)   piperacillin-tazobactam (ZOSYN)  IV 2.25 g (10/23/23 0726)   propofol  (DIPRIVAN )  infusion 30 mcg/kg/min (10/23/23 0719)    aspirin   81 mg Per Tube Daily   Or   aspirin  EC  81 mg Oral Daily   atorvastatin   10 mg Per Tube Daily   budesonide (PULMICORT) nebulizer solution  0.25 mg Nebulization BID   Chlorhexidine  Gluconate Cloth  6 each Topical Daily   docusate  100 mg Per Tube BID   escitalopram   20 mg Per Tube Daily   feeding supplement (PROSource TF20)  60 mL Per Tube Daily   insulin aspart  0-9 Units Subcutaneous Q4H   ipratropium-albuterol   3 mL Nebulization QID   methylPREDNISolone (SOLU-MEDROL) injection  40 mg Intravenous Daily   mouth rinse  15 mL Mouth Rinse Q2H   pantoprazole  (PROTONIX ) IV  40 mg Intravenous QHS   polyethylene glycol  17 g Per Tube Daily   sodium chloride  flush  3 mL Intravenous Q12H     Assessment/ Plan: AKI on CKD 3a: b/l creatinine 1.1- 1.4 from 2023, eGFR 41- 53 ml/min. Creatinine was 1.4 on admit, since then has steadily risen to 3.2 today though this is stable compared to yesterday afternoon after IV fluids given. Renal US  shows no obstruction. UA showed no rbc/ wbc's, mild proteinuria. UNa was low suggesting pre-renal. She rec'd IV vanc/ zosyn for 1st 2-3 days, then vanc dc'd. Also had IV contrast w/ CT scan on 10/01, and some borderline hypotension for about 48 hrs which then improved. Suspect AKI due to contrast nephropathy +/- IV vanc/ zosyn +/- borderline hypotension. May have ATN. She remains non oliguric with plateaued creatinine after MIVF - will continue x 24h.  Otherwise recommend supportive care, no indication for RRT at this time. We will follow.  PNA/ sepsis: per CCM, grew staph aureus in sputum culture - on linezolid Atrial fib: in NSR here, on heparin gtt. H/o CVA Anemia:  Hb 7s, transfusion per primary Concern for lung cancer vs abscess Renal cyst:  appearing benign on 10/21/23 US .   Will follow, reach out with concerns. D/w RN bedside.   Manuelita Barters MD 10/23/2023, 7:48 AM  [facility] Pager: [phone]

## 2023-10-23 NOTE — Progress Notes (Signed)
 PHARMACY - ANTICOAGULATION CONSULT NOTE  Pharmacy Consult for Heparin infusion Indication: atrial fibrillation  Allergies  Allergen Reactions   Codeine Itching and Other (See Comments)    Caused mouth to break out in hives   Neosporin [Neomycin-Bacitracin Zn-Polymyx] Itching and Swelling    Swelling at application site   Percocet [Oxycodone -Acetaminophen ] Other (See Comments)    B-blockers, triggers depression   Tussionex Pennkinetic Er [Hydrocod Poli-Chlorphe Poli Er] Itching    Patient Measurements: Height: 5' (152.4 cm) Weight: 57.1 kg (125 lb 14.1 oz) IBW/kg (Calculated) : 45.5 HEPARIN DW (KG): 56.7  Vital Signs: Temp: 97.8 F (36.6 C) (10/06 0739) Temp Source: Axillary (10/06 0739) BP: 140/58 (10/06 0700) Pulse Rate: 84 (10/06 0700)  Labs: Recent Labs    10/21/23 0239 10/22/23 0235 10/22/23 1528 10/23/23 0255  HGB 8.3* 8.2*  --  7.6*  HCT 26.6* 26.6*  --  24.6*  PLT 452* 509*  --  415*  HEPARINUNFRC 0.29* 0.34  --  0.27*  CREATININE 2.29* 2.97* 3.20* 3.19*    Estimated Creatinine Clearance: 11.3 mL/min (A) (by C-G formula based on SCr of 3.19 mg/dL (H)).   Assessment: 78 YOF. New onset Afib. Not on anticoagulation at home. Hx of CVA in 2018. Note history of GI bleed secondary to AVMs in past.   HL 0.27 subtherapeutic (slight drop from 0.34 yesterday) Hgb 7.6, Plt 415  Goal of Therapy:  Heparin level 0.3-0.7 units/ml Monitor platelets by anticoagulation protocol: Yes    Plan:  Increase heparin to 900 units / hr Daily heparin level and CBC Monitor for accumulation with worsening renal function  Sharyne Glatter, PharmD, BCCCP Critical Care Clinical Pharmacist 10/23/2023 8:33 AM

## 2023-10-23 NOTE — Progress Notes (Signed)
 SLP Cancellation Note  Patient Details Name: [name] MRN: [id number] DOB: May 29, 1945   Cancelled treatment:       Reason Eval/Treat Not Completed: Medical issues which prohibited therapy. Pt currently remains on the vent. Will continue to follow as able.    Leita SAILOR., M.A. CCC-SLP Acute Rehabilitation Services Office: [phone]  Secure chat preferred  10/23/2023, 7:52 AM

## 2023-10-23 NOTE — Progress Notes (Signed)
 NAME:  [name], MRN:  [id number], DOB:  08-18-1945, LOS: 5 ADMISSION DATE:  10/18/2023 CHIEF COMPLAINT:  SOB.    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  Presented from independent living facility after found to have noisy breathing and several days of progressive SOB found to be hypoxic in the 80's.  Pt states has been SOB for 3 days with new dry cough and poor PO intake related to dyspnea.  Denies fever, chills, chest pain, wt loss (although appears to have 19lb wt loss since 01/2023), hemoptysis, dysphagia or choking episodes. Does not use oxygen, nebs/ inhalers at baseline and denies OSA.    Treated in ER with O2, nebs, solumedrol.  Afebrile in ER, requiring 6L.  WBC 18.2, Hgb 9.4, sCr 1.48, ABG 7.414/ 29/ 153/ 18.6.  SARS/ flu/ RSV neg. CXR concerning for RLL caviatry lesion.  Empiric vancomycin  and zosyn started. CTA PE obtained, negative for PE, but extensive abnormality of RLL, RML and basilar aspect of RUE with mass like consolidation in RLL with multiple airway occlusions including endobronchial tumor/ debris and large 8.5 cm cavitary lesion of RLL with communication directly into lower lobe bronchi concerning for extensive pulmonary malignancy, pneumonia remains in differential.  Abnormal bronchovascular distribution within lingula extending into subpleural surface.  Also noted right renal abnormal contour suspicious for solid renal mass. Admitted to [facility] with Pulmonary consulting for further recomendations.   Pertinent  Medical History  former smoker (quit 2018), emphysema, CVA on plavix , HTN, HLD, GERD, anxiety, depression and GIB (AVMs-duodenal, cecal and right colon), OSA (denies), PVD, CKD3a, IDA, squamous cell carcinoma of tongue s/p excision  (2018)  Interim History / Subjective:  Pressure support trial failed as the patient became tachypneic.  Worsening white cell count today along with worsening creatinine. Significant secretions suctioned in the morning.  Significant Hospital Events: 10/1: admitted.  10/2: intubated.   Objective    Blood pressure (!) 179/96, pulse (!) 111, temperature 97.9 F (36.6 C), temperature source Axillary, resp. rate (!) 27, height 5' (1.524 m), weight 57.1 kg, SpO2 94%.    Vent Mode: PCV FiO2 (%):  [45 %] 45 % Set Rate:  [16 bmp] 16 bmp PEEP:  [5 cmH20] 5 cmH20 Plateau Pressure:  [12 cmH20-22 cmH20] 22 cmH20   Intake/Output Summary (Last 24 hours) at 10/23/2023 1142 Last data filed at 10/23/2023 0719 Gross per 24 hour  Intake 2075.65 ml  Output 635 ml  Net 1440.65 ml   Filed Weights   10/18/23 0942 10/21/23 0600  Weight: 56.7 kg 57.1 kg    Examination: General: Sedated and intubated, elderly lady. Lungs: No wheezing today.  Diminished breath sounds bilaterally. Heart: regular rate rhythm, no murmur appreciated.  Abdomen: non tender, non distended. Normal BS.  Neuro: Sedated.   Assessment and Plan   Neuro History of CVA: -Aspirin  and statin for now. -Plavix  was changed to aspirin  on 10/19/23 while being transferred to the ICU if need for procedures. - Switch Prop to precedex  - Continue PRN fent    Pulm Right lower lobe cavitary lung mass with opacity in right middle lobe and left lingula-lung abscess, will need to rule out malignancy:  Former smoker and prior tongue SCC: Hypoxic respiratory failure, mechanical ventilation: COPD exacerbation: - Lung opacity likely malignancy versus  lung abscess. - Intubated.  Transition to pressure control mode today. - Will stop antibiotics today  - Solu-Medrol 40mg  daily for 5 days(today is the last dose), scheduled DuoNebs - Vent bundle -  Will try SBT today. Became agitated     Cardiac/Vascular  A-fib: -Echo: EF normal with  reduced RV function. - Low-dose metoprolol. - heparin gtt.    GI #GERD  Diet:  Tube feeds  GI PPX: PPI      ID Stopping Zyxox and zosyn today, will monitor off antibiotics, completed 7 day course,  concern for malignancy with post obstruvtive pneumonia  BAL  growing MSSA   Renal  Renal cyst: - Dedicated CT abdomen did not show any metastatic changes.  However did show change in density of the renal mass compared to before.  - Renal ultrasound performed shows simple renal cyst stable since last 2 years.     AKI on CKD stage IIIb: Anuria/oliguria: -No improvement with IV fluids.  Poor urine output. -Likely ATN.  UA done. - Nephrology consulted. - No indication for CRRT right now  - she is positive 6.5L , will stop fluids for now    Heme/Onc DVT ppx on heparin drip   Endo ISS: increase to moderate    Elevated FT4 - started on methimazole for FT4 of 1.32. Will dc.  -Will need TSH and free T4 as an outpatient.  If continued elevation of free T4 will need to be started on methimazole.   MSK/other     Updated brother Norleen on 10/3 who lives in [place] .  Updated Rock her neighbor on 10/3.  Tried reaching out Rock today went to voicemail.,  Labs   CBC: Recent Labs  Lab 10/18/23 [phone] 10/18/23 0959 10/19/23 0428 10/19/23 1836 10/20/23 0412 10/20/23 1133 10/21/23 0239 10/22/23 0235 10/23/23 0255  WBC 18.2*  --  17.1*  --  19.2*  --  26.5* 37.6* 39.4*  NEUTROABS 14.1*  --   --   --   --   --   --   --   --   HGB 9.4*   < > 8.9*   < > 8.5* 8.8* 8.3* 8.2* 7.6*  HCT 30.1*   < > 27.6*   < > 27.6* 26.0* 26.6* 26.6* 24.6*  MCV 79.0*  --  77.7*  --  80.5  --  79.4* 81.8 81.5  PLT 473*  --  394  --  450*  --  452* 509* 415*   < > = values in this interval not displayed.    Basic Metabolic Panel: Recent Labs  Lab 10/19/23 0744 10/19/23 1836 10/20/23 0412 10/20/23 1133 10/21/23 0239 10/22/23 0235 10/22/23 1528 10/22/23 2117 10/23/23 0255  NA 133*   < >  135 134* 134* 135 133*  --  132*  K 3.9   < > 3.9 3.5 3.4* 5.1 5.6* 4.9 4.3  CL 99  --  102  --  99 102 100  --  98  CO2 14*  --  17*  --  19* 20* 18*  --  17*  GLUCOSE 139*  --  143*  --  162* 198* 201*  --  190*  BUN 34*  --  40*  --  53* 73* 83*  --  87*  CREATININE 1.27*  --  1.70*  --  2.29* 2.97* 3.20*  --  3.19*  CALCIUM  8.0*  --  7.9*  --  7.7* 7.7* 7.5*  --  7.2*  MG 2.2  --  2.0  --  2.5* 2.4  --   --  2.3  PHOS 4.1  --  4.9*  --  5.0* 6.9*  --   --  5.9*   < > = values in this interval not displayed.   GFR: Estimated Creatinine Clearance: 11.3 mL/min (A) (by C-G formula based on SCr of 3.19 mg/dL (H)). Recent Labs  Lab 10/18/23 0959 10/18/23 1420 10/19/23 0428 10/19/23 1832 10/20/23 0412 10/21/23 0239 10/22/23 0235 10/23/23 0255  PROCALCITON  --  2.42  --  2.40  --   --   --   --   WBC  --   --    < >  --  19.2* 26.5* 37.6* 39.4*  LATICACIDVEN 1.3  --   --   --   --   --   --   --    < > = values in this interval not displayed.    Liver Function Tests: Recent Labs  Lab 10/18/23 0937 10/19/23 0428  AST 26 24  ALT 13 14  ALKPHOS 107 103  BILITOT 1.3* 0.8  PROT 6.5 5.8*  ALBUMIN 2.1* 1.9*   No results for input(s): LIPASE, AMYLASE in the last 168 hours. No results for input(s): AMMONIA in the last 168 hours.  ABG    Component Value Date/Time   PHART 7.341 (L) 10/20/2023 1133   PCO2ART 34.9 10/20/2023 1133   PO2ART 69 (L) 10/20/2023 1133   HCO3 18.9 (L) 10/20/2023 1133   TCO2 20 (L) 10/20/2023 1133   ACIDBASEDEF 6.0 (H) 10/20/2023 1133   O2SAT 93 10/20/2023 1133      Past Medical History:  She,  has a past medical history of Asthma, Cancer (HCC), Depression, Hypertension, Peripheral vascular disease, Pneumonia, Sleep apnea, and Stroke (HCC).   Surgical History:   Past Surgical History:  Procedure Laterality Date   BIOPSY  08/16/2021   Procedure: BIOPSY;  Surgeon: Elicia Claw, MD;  Location: [facility] ENDOSCOPY;  Service: Gastroenterology;;    COLONOSCOPY WITH PROPOFOL  N/A 08/16/2021   Procedure: COLONOSCOPY WITH PROPOFOL ;  Surgeon: Elicia Claw, MD;  Location: [facility] ENDOSCOPY;  Service: Gastroenterology;  Laterality: N/A;   DIRECT LARYNGOSCOPY Left 06/29/2016   Procedure: WIDE EXCISION LEFT TONGUE CANCER DIRECT LARYNGOSCOPY;  Surgeon: Arlana Arnt, MD;  Location: [facility] OR;  Service: ENT;  Laterality: Left;   ESOPHAGOGASTRODUODENOSCOPY (EGD) WITH PROPOFOL  Left 07/12/2018   Procedure: ESOPHAGOGASTRODUODENOSCOPY (EGD) WITH PROPOFOL ;  Surgeon: Dianna Specking, MD;  Location: [facility] ENDOSCOPY;  Service: Endoscopy;  Laterality: Left;   ESOPHAGOGASTRODUODENOSCOPY (EGD) WITH PROPOFOL  N/A 08/16/2021   Procedure: ESOPHAGOGASTRODUODENOSCOPY (EGD) WITH PROPOFOL ;  Surgeon: Elicia Claw, MD;  Location: [facility] ENDOSCOPY;  Service: Gastroenterology;  Laterality: N/A;   ESOPHAGOSCOPY N/A 06/29/2016   Procedure: ESOPHAGOSCOPY/BRONCHOSCOPY;  Surgeon: Arlana Arnt, MD;  Location: [facility] OR;  Service: ENT;  Laterality: N/A;   EYE SURGERY Bilateral 2016   HEMOSTASIS CLIP PLACEMENT  08/16/2021   Procedure: HEMOSTASIS CLIP PLACEMENT;  Surgeon: Elicia Claw, MD;  Location: [facility] ENDOSCOPY;  Service: Gastroenterology;;   HOT HEMOSTASIS N/A 07/12/2018   Procedure: HOT HEMOSTASIS (ARGON PLASMA COAGULATION/BICAP);  Surgeon: Dianna Specking, MD;  Location: [facility] ENDOSCOPY;  Service: Endoscopy;  Laterality: N/A;   HOT HEMOSTASIS N/A 08/16/2021   Procedure: HOT HEMOSTASIS (ARGON PLASMA COAGULATION/BICAP);  Surgeon: Elicia Claw, MD;  Location: [facility] ENDOSCOPY;  Service: Gastroenterology;  Laterality: N/A;   KNEE SURGERY Right 2014   LOOP RECORDER INSERTION N/A 03/21/2016   Procedure: Loop Recorder Insertion;  Surgeon: Will Gladis Norton, MD;  Location: [facility]  INVASIVE CV LAB;  Service: Cardiovascular;  Laterality: N/A;   PARS PLANA VITRECTOMY Right 02/28/2014   Procedure: PARS PLANA VITRECTOMY 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 WITH TAP AND ANTIBIOTIC INJECTION;  Surgeon: Arley DELENA Ruder, MD;  Location: [facility] OR;  Service: Ophthalmology;  Laterality: Right;   POLYPECTOMY  08/16/2021   Procedure: POLYPECTOMY;  Surgeon: Elicia Claw, MD;  Location: [facility] ENDOSCOPY;  Service: Gastroenterology;;   SCLEROTHERAPY  08/16/2021   Procedure: MATIAS;  Surgeon: Elicia Claw, MD;  Location: [facility] ENDOSCOPY;  Service: Gastroenterology;;   TEE WITHOUT CARDIOVERSION N/A 03/21/2016   Procedure: TRANSESOPHAGEAL ECHOCARDIOGRAM (TEE);  Surgeon: Annabella Scarce, MD;  Location: [facility] ENDOSCOPY;  Service: Cardiovascular;  Laterality: N/A;     Social History:   reports that she quit smoking about 7 years ago. She has never used smokeless tobacco. She reports that she does not drink alcohol  and does not use drugs.   Family History:  Her family history includes Cancer in her mother; Heart disease in her brother; Stroke in her father.   Allergies Allergies  Allergen Reactions   Codeine Itching and Other (See Comments)    Caused mouth to break out in hives   Neosporin [Neomycin-Bacitracin Zn-Polymyx] Itching and Swelling    Swelling at application site   Percocet [Oxycodone -Acetaminophen ] Other (See Comments)    B-blockers, triggers depression   Tussionex Pennkinetic Er [Hydrocod Poli-Chlorphe Poli Er] Itching     Home Medications  Prior to Admission medications   Medication Sig Start Date End Date Taking? Authorizing Provider  amLODipine  (NORVASC ) 10 MG tablet Take 1 tablet (10 mg total) by mouth daily. 05/09/18  Yes Ricky Fines, MD  atorvastatin  (LIPITOR) 10 MG tablet Take 10 mg by mouth daily.  03/16/16  Yes [provider]  clopidogrel  (PLAVIX ) 75 MG tablet Take 1 tablet (75 mg total) by mouth daily. 07/17/18  Yes Gherghe, Costin M, MD  escitalopram  (LEXAPRO ) 20 MG tablet Take 20 mg by mouth daily.  07/10/14  Yes [provider]  ferrous sulfate  325 (65 FE) MG tablet Take 1 tablet (325 mg total) by mouth daily with breakfast. Patient taking differently: Take 325  mg by mouth every other day. Monday, Wednesday, Friday 07/13/18  Yes Gherghe, Costin M, MD  losartan  (COZAAR ) 25 MG tablet Take 25 mg by mouth daily. 09/29/23  Yes [provider]  pantoprazole  (PROTONIX ) 40 MG tablet Take 1 tablet (40 mg total) by mouth daily. 07/13/18  Yes Gherghe, Costin M, MD  vitamin E 180 MG (400 UNITS) capsule Take 400 Units by mouth daily.   Yes [provider]      The patient is critically ill due to acute hypoxic respiratroy failure requiring mechanical ventilation.  Critical care was necessary to treat or prevent imminent or life-threatening deterioration. Critical care time was spent by me on the following activities: development of a treatment plan with the patient and/or surrogate as well as nursing, discussions with consultants, evaluation of the patient's response to treatment, examination of the patient, obtaining a history from the patient or surrogate, ordering and performing treatments and interventions, ordering and review of laboratory studies, ordering and review of radiographic studies, review of telemetry data including pulse oximetry, re-evaluation of patient's condition and participation in multidisciplinary rounds.   I personally spent 51 minutes providing critical care not including any separately billable procedures.   Zola LOISE Herter, MD [facility] 10/23/2023 6:47 PM

## 2023-10-23 NOTE — Progress Notes (Signed)
 Nutrition Follow-up  DOCUMENTATION CODES:  Severe malnutrition in context of acute illness/injury  INTERVENTION:  Continue tube feeding via OGT: Osmolite 1.2 at 50 ml/h (1200 ml per day) Prosource TF20 60 ml 1x/d Provides 1520 kcal, 87 gm protein, 984 ml free water daily MVI with minerals daily, folic acid daily for deficiency Monitor stool output and consistency for need to adjust TF regimen or add fiber  NUTRITION DIAGNOSIS:  Severe Malnutrition related to acute illness (PNA?) as evidenced by moderate fat depletion, moderate muscle depletion. 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 Presented with breathing issues and diarrhea and found to have acute respiratory failure w/ hypoxia. Right lower lung mass found on CT with concern for cavitary pneumonia or malignancy.  10/1 - presented to ED 10/2 - rapid response, intubation, bronchoscopy, transferred to 55M 10/3 - TF initiated  Patient is currently intubated on ventilator support. No family at bedside at this time. TF were initiated on 10/3 and currently infusing at goal. Pt discussed during ICU rounds and with RN and MD. Did not tolerate SBT this AM. Will continue current regimen. Noted that pt having loose stools now. On several antibiotics which could be be contributing. Will monitor and add fiber if no improvement in the coming days.   Nephrology following for poor renal function labs, no indication for HD at this time.   Noted anemia panel drawn last week by provider. Folate noted to have resulted low. Will order  replacement.   MV: 12.9 L/min Temp (24hrs), Avg:98 F (36.7 C), Min:97.2 F (36.2 C), Max:98.7 F (37.1 C) MAP (cuff): 80-30mmHg this AM  Propofol : 11.91 ml/hr  Admit weight: 56.7 kg   Current weight: 57 kg    Intake/Output Summary (Last 24 hours) at 10/23/2023 1541 Last data filed at 10/23/2023 1200 Gross per 24 hour  Intake 3012.91 ml  Output 635 ml  Net 2377.91 ml  Net IO Since Admission: 7,436.12 mL [10/23/23 1541]  Drains/Lines: OGT 16 Fr. (Gastric) UOP x 24 hours FMS 2x documented ysterday  Average Meal Intake: 10/4: 0% intake x 1 recorded meals  Nutritionally Relevant Medications: Scheduled Meds:  atorvastatin   10 mg Per Tube Daily   docusate  100 mg Per Tube BID   PROSource TF20  60 mL Per Tube Daily   insulin aspart  0-9 Units Subcutaneous Q4H   methylPREDNISolone   40 mg Intravenous Daily   pantoprazole  IV  40 mg Intravenous QHS   polyethylene glycol  17 g Per Tube Daily   Continuous Infusions:  sodium chloride  65 mL/hr at 10/23/23 0719   feeding supplement (OSMOLITE 1.2 CAL) 50 mL/hr at 10/23/23 0719   linezolid (ZYVOX) IV 600 mg (10/23/23 0915)   piperacillin-tazobactam (ZOSYN)  IV 2.25 g (10/23/23 0726)   propofol  (DIPRIVAN ) infusion 35 mcg/kg/min (10/23/23 0920)   PRN Meds: ondansetron   Labs Reviewed: Na 132 BUN 87, creatinine 3.19 Phosphorus 5.9 CBG ranges from 151-222 mg/dL over the last 24 hours HgbA1c 6.4% (10/3)  Micronutrient Profile CRP 44.3 (10/1) Vitamin B12 310 Folate <3.0 (will order replacement)  NUTRITION - FOCUSED PHYSICAL EXAM: Flowsheet  Row Most Recent Value  Orbital Region Moderate depletion  Upper Arm Region Moderate depletion  Thoracic and Lumbar Region Severe depletion  Buccal Region Mild depletion  Temple Region Mild depletion  Clavicle Bone Region Moderate depletion  Clavicle and Acromion Bone Region Moderate depletion  Scapular Bone Region Mild depletion  Dorsal Hand Severe depletion  Patellar Region  Unable to assess  Anterior Thigh Region Moderate depletion  Posterior Calf Region Mild depletion  Edema (RD Assessment) None  Hair Reviewed  Eyes Reviewed  Mouth Reviewed  Skin Reviewed  Nails Reviewed    Diet Order:   Diet Order             Diet NPO time specified  Diet effective now                   EDUCATION NEEDS:  Not appropriate for education at this time  Skin:  Skin Assessment: Reviewed RN Assessment  Last BM:  10/6 - type 7 FMS placed 10/5  Height:  Ht Readings from Last 1 Encounters:  10/23/23 5' (1.524 m)   Weight:  Wt Readings from Last 1 Encounters:  10/21/23 57.1 kg    Ideal Body Weight:  45.5 kg  BMI:  Body mass index is 24.58 kg/m.  Estimated Nutritional Needs:  Kcal:  1500-1700 kcals Protein:  75-90g Fluid:  1.4-1.6L/day    Vernell Lukes, RD, LDN, CNSC Registered Dietitian II Please reach out via secure chat

## 2023-10-24 ENCOUNTER — Inpatient Hospital Stay (HOSPITAL_COMMUNITY)

## 2023-10-24 LAB — POCT I-STAT 7, (LYTES, BLD GAS, ICA,H+H)
Acid-base deficit: 6 mmol/L — ABNORMAL HIGH (ref 0.0–2.0)
Acid-base deficit: 5 mmol/L — ABNORMAL HIGH (ref 0.0–2.0)
Acid-base deficit: 4 mmol/L — ABNORMAL HIGH (ref 0.0–2.0)
Bicarbonate: 25.4 mmol/L (ref 20.0–28.0)
Bicarbonate: 24.6 mmol/L (ref 20.0–28.0)
Bicarbonate: 24.3 mmol/L (ref 20.0–28.0)
Calcium, Ion: 1.14 mmol/L — ABNORMAL LOW (ref 1.15–1.40)
Calcium, Ion: 1.12 mmol/L — ABNORMAL LOW (ref 1.15–1.40)
Calcium, Ion: 1.12 mmol/L — ABNORMAL LOW (ref 1.15–1.40)
HCT: 25 % — ABNORMAL LOW (ref 36.0–46.0)
HCT: 24 % — ABNORMAL LOW (ref 36.0–46.0)
HCT: 24 % — ABNORMAL LOW (ref 36.0–46.0)
Hemoglobin: 8.5 g/dL — ABNORMAL LOW (ref 12.0–15.0)
Hemoglobin: 8.2 g/dL — ABNORMAL LOW (ref 12.0–15.0)
Hemoglobin: 8.2 g/dL — ABNORMAL LOW (ref 12.0–15.0)
O2 Saturation: 96 %
O2 Saturation: 94 %
O2 Saturation: 95 %
Patient temperature: 98.7
Patient temperature: 99.3
Patient temperature: 99.3
Potassium: 6.2 mmol/L — ABNORMAL HIGH (ref 3.5–5.1)
Potassium: 6 mmol/L — ABNORMAL HIGH (ref 3.5–5.1)
Potassium: 5.8 mmol/L — ABNORMAL HIGH (ref 3.5–5.1)
Sodium: 137 mmol/L (ref 135–145)
Sodium: 138 mmol/L (ref 135–145)
Sodium: 138 mmol/L (ref 135–145)
TCO2: 28 mmol/L (ref 22–32)
TCO2: 27 mmol/L (ref 22–32)
TCO2: 26 mmol/L (ref 22–32)
pCO2 arterial: 102.5 mmHg (ref 32–48)
pCO2 arterial: 78.8 mmHg (ref 32–48)
pCO2 arterial: 67.9 mmHg (ref 32–48)
pH, Arterial: 7.002 — CL (ref 7.35–7.45)
pH, Arterial: 7.105 — CL (ref 7.35–7.45)
pH, Arterial: 7.164 — CL (ref 7.35–7.45)
pO2, Arterial: 131 mmHg — ABNORMAL HIGH (ref 83–108)
pO2, Arterial: 99 mmHg (ref 83–108)
pO2, Arterial: 99 mmHg (ref 83–108)

## 2023-10-24 LAB — BASIC METABOLIC PANEL WITH GFR
Anion gap: 14 (ref 5–15)
BUN: 105 mg/dL — ABNORMAL HIGH (ref 8–23)
CO2: 22 mmol/L (ref 22–32)
Calcium: 7.8 mg/dL — ABNORMAL LOW (ref 8.9–10.3)
Chloride: 101 mmol/L (ref 98–111)
Creatinine, Ser: 3.41 mg/dL — ABNORMAL HIGH (ref 0.44–1.00)
GFR, Estimated: 13 mL/min — ABNORMAL LOW (ref >60)
Glucose, Bld: 154 mg/dL — ABNORMAL HIGH (ref 70–99)
Potassium: 5.6 mmol/L — ABNORMAL HIGH (ref 3.5–5.1)
Sodium: 137 mmol/L (ref 135–145)

## 2023-10-24 LAB — CBC
HCT: 27.2 % — ABNORMAL LOW (ref 36.0–46.0)
Hemoglobin: 8.2 g/dL — ABNORMAL LOW (ref 12.0–15.0)
MCH: 25.5 pg — ABNORMAL LOW (ref 26.0–34.0)
MCHC: 30.1 g/dL (ref 30.0–36.0)
MCV: 84.5 fL (ref 80.0–100.0)
Platelets: 499 K/uL — ABNORMAL HIGH (ref 150–400)
RBC: 3.22 MIL/uL — ABNORMAL LOW (ref 3.87–5.11)
RDW: 17.4 % — ABNORMAL HIGH (ref 11.5–15.5)
WBC: 62.9 K/uL (ref 4.0–10.5)
nRBC: 0.6 % — ABNORMAL HIGH (ref 0.0–0.2)

## 2023-10-24 LAB — GLUCOSE, CAPILLARY
Glucose-Capillary: 134 mg/dL — ABNORMAL HIGH (ref 70–99)
Glucose-Capillary: 194 mg/dL — ABNORMAL HIGH (ref 70–99)
Glucose-Capillary: 163 mg/dL — ABNORMAL HIGH (ref 70–99)
Glucose-Capillary: 145 mg/dL — ABNORMAL HIGH (ref 70–99)
Glucose-Capillary: 163 mg/dL — ABNORMAL HIGH (ref 70–99)
Glucose-Capillary: 141 mg/dL — ABNORMAL HIGH (ref 70–99)

## 2023-10-24 LAB — FUNGITELL BETA-D-GLUCAN: Fungitell Value:: <31.25 pg/mL

## 2023-10-24 LAB — CYTOLOGY - NON PAP

## 2023-10-24 LAB — HEPARIN LEVEL (UNFRACTIONATED): Heparin Unfractionated: 0.36 [IU]/mL (ref 0.30–0.70)

## 2023-10-24 LAB — CULTURE, RESPIRATORY W GRAM STAIN

## 2023-10-24 LAB — MAGNESIUM: Magnesium: 2.7 mg/dL — ABNORMAL HIGH (ref 1.7–2.4)

## 2023-10-24 MED ORDER — ARFORMOTEROL TARTRATE 15 MCG/2ML IN NEBU
15.0000 ug | INHALATION_SOLUTION | Freq: Two times a day (BID) | RESPIRATORY_TRACT | Status: DC
  Administered 2023-10-24 – 2023-10-26 (×4): 15 ug via RESPIRATORY_TRACT
  Filled 2023-10-24 (×4): qty 2

## 2023-10-24 MED ORDER — PROPOFOL 1000 MG/100ML IV EMUL
0.0000 ug/kg/min | INTRAVENOUS | Status: DC
  Administered 2023-10-24: 20 ug/kg/min via INTRAVENOUS
  Administered 2023-10-24 – 2023-10-26 (×6): 35 ug/kg/min via INTRAVENOUS
  Administered 2023-10-26: 30 ug/kg/min via INTRAVENOUS
  Filled 2023-10-24 (×3): qty 100
  Filled 2023-10-24: qty 200
  Filled 2023-10-24 (×3): qty 100

## 2023-10-24 MED ORDER — HEPARIN SODIUM (PORCINE) 5000 UNIT/ML IJ SOLN
5000.0000 [IU] | Freq: Three times a day (TID) | INTRAMUSCULAR | Status: DC
  Administered 2023-10-24 – 2023-10-26 (×6): 5000 [IU] via SUBCUTANEOUS
  Filled 2023-10-24 (×7): qty 1

## 2023-10-24 MED ORDER — SODIUM ZIRCONIUM CYCLOSILICATE 10 G PO PACK
10.0000 g | PACK | Freq: Once | ORAL | Status: AC
  Administered 2023-10-24: 10 g
  Filled 2023-10-24: qty 1

## 2023-10-24 MED ORDER — PREDNISONE 20 MG PO TABS
40.0000 mg | ORAL_TABLET | Freq: Every day | ORAL | Status: DC
  Administered 2023-10-25 – 2023-10-26 (×2): 40 mg
  Filled 2023-10-24 (×2): qty 2

## 2023-10-24 MED ORDER — FENTANYL 2500MCG IN NS 250ML (10MCG/ML) PREMIX INFUSION
0.0000 ug/h | INTRAVENOUS | Status: DC
  Administered 2023-10-24 – 2023-10-26 (×2): 50 ug/h via INTRAVENOUS
  Filled 2023-10-24 (×2): qty 250

## 2023-10-24 MED ORDER — REVEFENACIN 175 MCG/3ML IN SOLN
175.0000 ug | Freq: Every day | RESPIRATORY_TRACT | Status: DC
  Administered 2023-10-25 – 2023-10-26 (×2): 175 ug via RESPIRATORY_TRACT
  Filled 2023-10-24 (×2): qty 3

## 2023-10-24 MED ORDER — REVEFENACIN 175 MCG/3ML IN SOLN: 175.0000 ug | Freq: Every day | RESPIRATORY_TRACT | Status: DC

## 2023-10-24 MED ORDER — FENTANYL BOLUS VIA INFUSION: 25.0000 ug | INTRAVENOUS | Status: DC | PRN

## 2023-10-24 MED ORDER — IPRATROPIUM-ALBUTEROL 0.5-2.5 (3) MG/3ML IN SOLN
3.0000 mL | Freq: Four times a day (QID) | RESPIRATORY_TRACT | Status: AC
Start: 2023-10-24 — End: 2023-10-25
  Administered 2023-10-24 – 2023-10-25 (×3): 3 mL via RESPIRATORY_TRACT
  Filled 2023-10-24 (×3): qty 3

## 2023-10-24 MED ORDER — FENTANYL CITRATE PF 50 MCG/ML IJ SOSY: 25.0000 ug | PREFILLED_SYRINGE | Freq: Once | INTRAMUSCULAR | Status: DC

## 2023-10-24 NOTE — Progress Notes (Signed)
 RT NOTE: holding 0800 CPT due to patient's hemodynamics.  Will continue to monitor.

## 2023-10-24 NOTE — Progress Notes (Addendum)
 [facility] Progress Note   Subjective:   Remains intubated and sedated, sedation switched to precedex yesterday.  No pressor support.  WBC up to 63k today but normothermic.  UOP >  yesterday. Net + 1.1 mL yesterday, + 7.6L for admission.   Objective Vitals:   10/24/23 0515 10/24/23 0530 10/24/23 0545 10/24/23 0600  BP: (!) 136/59 137/60 135/62 137/62  Pulse: 100 (!) 101 (!) 101 (!) 101  Resp: 20 (!) 25 17 (!) 21  Temp:      TempSrc:      SpO2: 91% 91% 90% 93%  Weight:      Height:       Physical Exam Gen on vent, sedated, elderly WF Sclera anicteric, throat w/ ETT No jvd or bruits Chest clear anterior/ lateral RRR no MRG Abd soft  GU foley with clear yellow urine at decent volume Ext no LE edema, 1+ bilat UE edema Neuro is on vent, sedated  Additional Objective Labs: Basic Metabolic Panel: Recent Labs  Lab 10/21/23 0239 10/22/23 0235 10/22/23 1528 10/22/23 2117 10/23/23 0255  NA 134* 135 133*  --  132*  K 3.4* 5.1 5.6* 4.9 4.3  CL 99 102 100  --  98  CO2 19* 20* 18*  --  17*  GLUCOSE 162* 198* 201*  --  190*  BUN 53* 73* 83*  --  87*  CREATININE 2.29* 2.97* 3.20*  --  3.19*  CALCIUM  7.7* 7.7* 7.5*  --  7.2*  PHOS 5.0* 6.9*  --   --  5.9*   Liver Function Tests: Recent Labs  Lab 10/18/23 0937 10/19/23 0428  AST 26 24  ALT 13 14  ALKPHOS 107 103  BILITOT 1.3* 0.8  PROT 6.5 5.8*  ALBUMIN 2.1* 1.9*   No results for input(s): LIPASE, AMYLASE in the last 168 hours. CBC: Recent Labs  Lab 10/18/23 0937 10/18/23 0959 10/21/23 0239 10/22/23 0235 10/23/23 0255 10/23/23 1940 10/24/23 0533  WBC 18.2*   < > 26.5* 37.6* 39.4* 46.3* 62.9*  NEUTROABS 14.1*  --   --   --   --   --   --   HGB 9.4*   < > 8.3* 8.2* 7.6* 7.9* 8.2*  HCT 30.1*   < > 26.6* 26.6* 24.6* 25.7* 27.2*  MCV 79.0*   < > 79.4* 81.8 81.5 82.1 84.5  PLT 473*   < > 452* 509* 415* 409* 499*   < > = values in this interval not displayed.   Blood Culture     Component Value Date/Time   SDES TRACHEAL ASPIRATE 10/22/2023 0851   SPECREQUEST NONE 10/22/2023 0851   CULT  10/22/2023 0851    CULTURE REINCUBATED FOR BETTER GROWTH Performed at [facility], [address] [place], [place] [address]    REPTSTATUS PENDING 10/22/2023 [phone]    Cardiac Enzymes: No results for input(s): CKTOTAL, CKMB, CKMBINDEX, TROPONINI in the last 168 hours. CBG: Recent Labs  Lab 10/23/23 1130 10/23/23 1527 10/23/23 1948 10/23/23 2341 10/24/23 0341  GLUCAP 194* 202* 165* 125* 134*   Iron Studies: No results for input(s): IRON, TIBC, TRANSFERRIN, FERRITIN in the last 72 hours. @lablastinr3 @ Studies/Results: DG CHEST PORT 1 VIEW Result Date: 10/23/2023 CLINICAL DATA:  Fever and shortness of breath. EXAM: PORTABLE CHEST 1 VIEW COMPARISON:  10/19/2023 FINDINGS: Endotracheal tube tip 5.5 cm above the carina. Nasogastric tube extending into the stomach with the side hole in the mid stomach and tip not included. The cardiac silhouette  remains borderline enlarged. Prominent patchy density at the right lung base, mildly improved. Tortuous and partially calcified thoracic aorta. No definite residual pleural fluid seen on either side. Unremarkable bones. IMPRESSION: 1. Mildly improved right basilar pneumonia. 2. No definite residual pleural fluid seen on either side. 3. Borderline cardiomegaly. Electronically Signed   By: Elspeth Bathe M.D.   On: 10/23/2023 16:14   Medications:  dexmedetomidine (PRECEDEX) IV infusion 0.7 mcg/kg/hr (10/24/23 0607)   feeding supplement (OSMOLITE 1.2 CAL) 50 mL/hr at 10/24/23 0600   fentaNYL  infusion INTRAVENOUS 50 mcg/hr (10/24/23 0600)   heparin 950 Units/hr (10/24/23 0726)    aspirin   81 mg Per Tube Daily   Or   aspirin  EC  81 mg Oral Daily   atorvastatin   10 mg Per Tube Daily   Chlorhexidine  Gluconate Cloth  6 each Topical Daily   docusate  100 mg Per Tube BID   escitalopram   20 mg Per Tube Daily   feeding  supplement (PROSource TF20)  60 mL Per Tube Daily   fentaNYL  (SUBLIMAZE ) injection  25-50 mcg Intravenous Once   folic acid  1 mg Per Tube Daily   insulin aspart  0-15 Units Subcutaneous Q4H   ipratropium-albuterol   3 mL Nebulization QID   multivitamin with minerals  1 tablet Per Tube Daily   mouth rinse  15 mL Mouth Rinse Q2H   pantoprazole  (PROTONIX ) IV  40 mg Intravenous QHS   polyethylene glycol  17 g Per Tube Daily   sodium chloride  flush  3 mL Intravenous Q12H     Assessment/ Plan: AKI on CKD 3a: b/l creatinine 1.1- 1.4 from 2023, eGFR 41- 53 ml/min. Creatinine was 1.4 on admit, since then has steadily risen to 3.2 today though this is stable compared to yesterday afternoon after IV fluids given. Renal US  shows no obstruction. UA showed no rbc/ wbc's, mild proteinuria. UNa was low suggesting pre-renal. She rec'd IV vanc/ zosyn for 1st 2-3 days, then vanc dc'd. Also had IV contrast w/ CT scan on 10/01, and some borderline hypotension for about 48 hrs which then improved. Suspect AKI due to contrast nephropathy +/- IV vanc/ zosyn +/- borderline hypotension. May have ATN.  Labs not yet done this AM - phlebotomy in unit, RN to have them drawn and alert me when done; with good UOP do not suspect there will be major issue but will see.   Otherwise recommend supportive care, no indication for RRT at this time. We will follow.  PNA/ sepsis: per CCM, grew staph aureus in sputum culture - on linezolid, with rising WBC likely will need reimaging, avoid contrast if at all able Atrial fib: in NSR here, on heparin gtt. H/o CVA Anemia:  Hb 7s 8-s, transfusion per primary Concern for lung cancer vs abscess Renal cyst:  appearing benign on 10/21/23 US .   Will follow, reach out with concerns. D/w RN bedside.   Addendum:  labs rev'd K 5.6 - giving lokelma 10g, BUN 105, Cr 3.4, bicarb 22, unfortunately kidney function continues to worsen.  No current indications for RRT at this time.  Nothing else  modifiable identified - cont current care/plan as above.   Manuelita Barters MD 10/24/2023, 7:32 AM  [facility] Pager: [phone]

## 2023-10-24 NOTE — Progress Notes (Addendum)
 eLink Physician-Brief Progress Note Patient Name: [name] DOB: 1945/01/23 MRN: [id number]   Date of Service  10/24/2023  HPI/Events of Note  Prop changed to Dex on days. She is maxed on Dex & has received multi PRN doses of fent. She is now stacking on the vent & O2 is dropping appears to be using accessory muscles of breathing  Given worsening respiratory symptoms with increased sputum production and tachypnea, favor deepening RASS rather than lightening sedation  eICU Interventions  Continue Dex for now, low threshold to switch back to propofol   Add fentanyl  infusion, maximize analgesia first     Intervention Category Intermediate Interventions: Respiratory distress - evaluation and management  Minas Bonser 10/24/2023, 12:19 AM

## 2023-10-24 NOTE — Progress Notes (Signed)
 NAME:  [name], MRN:  [id number], DOB:  03-Feb-1945, LOS: 6 ADMISSION DATE:  10/18/2023 CHIEF COMPLAINT:  SOB.    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  Presented from independent living facility after found to have noisy breathing and several days of progressive SOB found to be hypoxic in the 80's.  Pt states has been SOB for 3 days with new dry cough and poor PO intake related to dyspnea.  Denies fever, chills, chest pain, wt loss (although appears to have 19lb wt loss since 01/2023), hemoptysis, dysphagia or choking episodes. Does not use oxygen, nebs/ inhalers at baseline and denies OSA.    Treated in ER with O2, nebs, solumedrol.  Afebrile in ER, requiring 6L.  WBC 18.2, Hgb 9.4, sCr 1.48, ABG 7.414/ 29/ 153/ 18.6.  SARS/ flu/ RSV neg. CXR concerning for RLL caviatry lesion.  Empiric vancomycin  and zosyn started. CTA PE obtained, negative for PE, but extensive abnormality of RLL, RML and basilar aspect of RUE with mass like consolidation in RLL with multiple airway occlusions including endobronchial tumor/ debris and large 8.5 cm cavitary lesion of RLL with communication directly into lower lobe bronchi concerning for extensive pulmonary malignancy, pneumonia remains in differential.  Abnormal bronchovascular distribution within lingula extending into subpleural surface.  Also noted right renal abnormal contour suspicious for solid renal mass. Admitted to [facility] with Pulmonary consulting for further recomendations.   Pertinent  Medical History  former smoker (quit 2018), emphysema, CVA on plavix , HTN, HLD, GERD, anxiety, depression and GIB (AVMs-duodenal, cecal and right colon), OSA (denies), PVD, CKD3a, IDA, squamous cell carcinoma of tongue s/p excision  (2018)  Interim History / Subjective:  Pressure support trial failed as the patient became tachypneic.  Worsening white cell count today along with worsening creatinine. Significant secretions suctioned in the morning.  Significant Hospital Events: 10/1: admitted.  10/2: intubated.   Objective    Blood pressure (!) 142/58, pulse 97, temperature 98.7 F (37.1 C), temperature source Oral, resp. rate (!) 25, height 5' (1.524 m), weight 57.1 kg, SpO2 90%.    Vent Mode: PCV FiO2 (%):  [45 %-100 %] 80 % Set Rate:  [16 bmp] 16 bmp PEEP:  [5 cmH20] 5 cmH20 Plateau Pressure:  [11 cmH20-22 cmH20] 14 cmH20   Intake/Output Summary (Last 24 hours) at 10/24/2023 1010 Last data filed at 10/24/2023 0600 Gross per 24 hour  Intake 1771.54 ml  Output 390 ml  Net 1381.54 ml   Filed Weights   10/18/23 0942 10/21/23 0600  Weight: 56.7 kg 57.1 kg    Examination: General: Sedated and intubated, elderly lady. Lungs:  Wheezing .  Diminished breath sounds bilaterally. Heart: regular rate rhythm, no murmur appreciated.  Abdomen: non tender, non distended. Normal BS.  Neuro: Sedated.   Assessment and Plan   Neuro History of CVA: -Aspirin  and statin for now. -Plavix  was changed to aspirin  on 10/19/23 while being transferred to the ICU if need for procedures. - Switch Prop to precedex  - Continue PRN fent    Pulm Right lower lobe cavitary lung mass with opacity in right middle lobe and left lingula-lung abscess, will need to rule out malignancy:  Former smoker and prior tongue SCC: Hypoxic respiratory failure, mechanical ventilation: COPD exacerbation: - Lung opacity likely malignancy versus  lung abscess. - Intubated.  Transition to pressure control mode today. - Will stop antibiotics today  - Solu-Medrol 40mg  daily for 5 days(today is the last dose), scheduled DuoNebs - Vent bundle -  Will try SBT today. Became agitated    Worsening respiratory status today, added steroids again and  long acting nebs. Changed her Vent settings to [facility]. Developed significant respiratory acidosis throughout the day that is now improving    Cardiac/Vascular  A-fib: -Echo: EF normal with reduced RV function. - Low-dose metoprolol. - heparin gtt.    GI #GERD  Diet:  Tube feeds  GI PPX: PPI      ID Stopping Zyxox and zosyn today, will monitor off antibiotics, completed 7 day course,  concern for malignancy with post obstruvtive pneumonia  BAL  growing MSSA   Renal  Renal cyst: - Dedicated CT abdomen did not show any metastatic changes.  However did show change in density of the renal mass compared to before.  - Renal ultrasound performed shows simple renal cyst stable since last 2 years.     AKI on CKD stage IIIb: Anuria/oliguria: -No improvement with IV fluids.  Poor urine output. -Likely ATN.  UA done. - Nephrology consulted. - No indication for CRRT right now  - she is positive 6.5L , will stop fluids for now    Heme/Onc Switch to DVT ppx instead of heparin drip given report of some bloody stools overnight    Endo ISS: increase to moderate    Elevated FT4 - started on methimazole for FT4 of 1.32. Will dc.  -Will need TSH and free T4 as an outpatient.  If continued elevation of free T4 will need to be started on methimazole.   MSK/other     Updated brother Norleen on 10/3 who lives in [place] .  Updated Rock her neighbor on 10/3.  Tried reaching out Rock today went to voicemail.,  Labs   CBC: Recent Labs  Lab 10/18/23 [phone] 10/18/23 0959 10/21/23 0239 10/22/23 0235 10/23/23 0255 10/23/23 1940 10/24/23 0533  WBC 18.2*   < > 26.5* 37.6* 39.4* 46.3* 62.9*  NEUTROABS 14.1*  --   --   --   --   --   --   HGB 9.4*   < > 8.3* 8.2* 7.6* 7.9* 8.2*  HCT 30.1*   < > 26.6* 26.6* 24.6* 25.7* 27.2*  MCV 79.0*   < > 79.4* 81.8 81.5 82.1 84.5  PLT 473*   < > 452* 509* 415* 409* 499*   < > = values in this interval not displayed.    Basic Metabolic  Panel: Recent Labs  Lab 10/19/23 0744 10/19/23 1836 10/20/23 0412 10/20/23 1133 10/21/23 0239 10/22/23 0235 10/22/23 1528 10/22/23 2117 10/23/23 0255 10/24/23 0533  NA 133*   < > 135   < > 134* 135 133*  --  132* 137  K 3.9   < > 3.9   < > 3.4* 5.1 5.6* 4.9 4.3 5.6*  CL 99  --  102  --  99 102 100  --  98 101  CO2 14*  --  17*  --  19* 20* 18*  --  17* 22  GLUCOSE 139*  --  143*  --  162* 198* 201*  --  190* 154*  BUN 34*  --  40*  --  53* 73* 83*  --  87* 105*  CREATININE 1.27*  --  1.70*  --  2.29* 2.97* 3.20*  --  3.19*  3.41*  CALCIUM  8.0*  --  7.9*  --  7.7* 7.7* 7.5*  --  7.2* 7.8*  MG 2.2  --  2.0  --  2.5* 2.4  --   --  2.3 2.7*  PHOS 4.1  --  4.9*  --  5.0* 6.9*  --   --  5.9*  --    < > = values in this interval not displayed.   GFR: Estimated Creatinine Clearance: 10.6 mL/min (A) (by C-G formula based on SCr of 3.41 mg/dL (H)). Recent Labs  Lab 10/18/23 0959 10/18/23 1420 10/19/23 0428 10/19/23 1832 10/20/23 0412 10/22/23 0235 10/23/23 0255 10/23/23 1940 10/24/23 0533  PROCALCITON  --  2.42  --  2.40  --   --   --   --   --   WBC  --   --    < >  --    < > 37.6* 39.4* 46.3* 62.9*  LATICACIDVEN 1.3  --   --   --   --   --   --   --   --    < > = values in this interval not displayed.    Liver Function Tests: Recent Labs  Lab 10/18/23 0937 10/19/23 0428  AST 26 24  ALT 13 14  ALKPHOS 107 103  BILITOT 1.3* 0.8  PROT 6.5 5.8*  ALBUMIN 2.1* 1.9*   No results for input(s): LIPASE, AMYLASE in the last 168 hours. No results for input(s): AMMONIA in the last 168 hours.  ABG    Component Value Date/Time   PHART 7.341 (L) 10/20/2023 1133   PCO2ART 34.9 10/20/2023 1133   PO2ART 69 (L) 10/20/2023 1133   HCO3 18.9 (L) 10/20/2023 1133   TCO2 20 (L) 10/20/2023 1133   ACIDBASEDEF 6.0 (H) 10/20/2023 1133   O2SAT 93 10/20/2023 1133      Past Medical History:  She,  has a past medical history of Asthma, Cancer (HCC), Depression, Hypertension,  Peripheral vascular disease, Pneumonia, Sleep apnea, and Stroke (HCC).   Surgical History:   Past Surgical History:  Procedure Laterality Date   BIOPSY  08/16/2021   Procedure: BIOPSY;  Surgeon: Elicia Claw, MD;  Location: [facility] ENDOSCOPY;  Service: Gastroenterology;;   COLONOSCOPY WITH PROPOFOL  N/A 08/16/2021   Procedure: COLONOSCOPY WITH PROPOFOL ;  Surgeon: Elicia Claw, MD;  Location: [facility]OS[facility];  Service: Gastroenterology;  Laterality: N/A;   DIRECT LARYNGOSCOPY Left 06/29/2016   Procedure: WIDE EXCISION LEFT TONGUE CANCER DIRECT LARYNGOSCOPY;  Surgeon: Arlana Arnt, MD;  Location: [facility] OR;  Service: ENT;  Laterality: Left;   ESOPHAGOGASTRODUODENOSCOPY (EGD) WITH PROPOFOL  Left 07/12/2018   Procedure: ESOPHAGOGASTRODUODENOSCOPY (EGD) WITH PROPOFOL ;  Surgeon: Dianna Specking, MD;  Location: [facility] ENDOSCOPY;  Service: Endoscopy;  Laterality: Left;   ESOPHAGOGASTRODUODENOSCOPY (EGD) WITH PROPOFOL  N/A 08/16/2021   Procedure: ESOPHAGOGASTRODUODENOSCOPY (EGD) WITH PROPOFOL ;  Surgeon: Elicia Claw, MD;  Location: [facility];  Service: Gastroenterology;  Laterality: N/A;   ESOPHAGOSCOPY N/A 06/29/2016   Procedure: ESOPHAGOSCOPY/BRONCHOSCOPY;  Surgeon: Arlana Arnt, MD;  Location: [facility] OR;  Service: ENT;  Laterality: N/A;   EYE SURGERY Bilateral 2016   HEMOSTASIS CLIP PLACEMENT  08/16/2021   Procedure: HEMOSTASIS CLIP PLACEMENT;  Surgeon: Elicia Claw, MD;  Location: [facility];  Service: Gastroenterology;;   HOT HEMOSTASIS N/A 07/12/2018   Procedure: HOT HEMOSTASIS (ARGON PLASMA COAGULATION/BICAP);  Surgeon: Dianna Specking, MD;  Location: [facility] ENDOSCOPY;  Service: Endoscopy;  Laterality: N/A;   HOT HEMOSTASIS N/A 08/16/2021   Procedure: HOT HEMOSTASIS (ARGON PLASMA COAGULATION/BICAP);  Surgeon:  Elicia Claw, MD;  Location: [facility];  Service: Gastroenterology;  Laterality: N/A;   KNEE SURGERY Right 2014   LOOP RECORDER INSERTION N/A 03/21/2016   Procedure: Loop Recorder  Insertion;  Surgeon: Will Gladis Norton, MD;  Location: [facility] LAB;  Service: Cardiovascular;  Laterality: N/A;   PARS PLANA VITRECTOMY Right 02/28/2014   Procedure: PARS PLANA VITRECTOMY 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 WITH TAP AND ANTIBIOTIC INJECTION;  Surgeon: Arley DELENA Ruder, MD;  Location: [facility] OR;  Service: Ophthalmology;  Laterality: Right;   POLYPECTOMY  08/16/2021   Procedure: POLYPECTOMY;  Surgeon: Elicia Claw, MD;  Location: [facility] ENDOSCOPY;  Service: Gastroenterology;;   SCLEROTHERAPY  08/16/2021   Procedure: MATIAS;  Surgeon: Elicia Claw, MD;  Location: [facility]OSCOPY;  Service: Gastroenterology;;   TEE WITHOUT CARDIOVERSION N/A 03/21/2016   Procedure: TRANSESOPHAGEAL ECHOCARDIOGRAM (TEE);  Surgeon: Annabella Scarce, MD;  Location: [facility] ENDOSCOPY;  Service: Cardiovascular;  Laterality: N/A;     Social History:   reports that she quit smoking about 7 years ago. She has never used smokeless tobacco. She reports that she does not drink alcohol  and does not use drugs.   Family History:  Her family history includes Cancer in her mother; Heart disease in her brother; Stroke in her father.   Allergies Allergies  Allergen Reactions   Codeine Itching and Other (See Comments)    Caused mouth to break out in hives   Neosporin [Neomycin-Bacitracin Zn-Polymyx] Itching and Swelling    Swelling at application site   Percocet [Oxycodone -Acetaminophen ] Other (See Comments)    B-blockers, triggers depression   Tussionex Pennkinetic Er [Hydrocod Poli-Chlorphe Poli Er] Itching     Home Medications  Prior to Admission medications   Medication Sig Start Date End Date Taking? Authorizing Provider  amLODipine  (NORVASC ) 10 MG tablet Take 1 tablet (10 mg total) by mouth daily. 05/09/18  Yes Ricky Fines, MD  atorvastatin  (LIPITOR) 10 MG tablet Take 10 mg by mouth daily.  03/16/16  Yes [provider]  clopidogrel  (PLAVIX ) 75 MG tablet Take 1 tablet (75 mg total) by mouth  daily. 07/17/18  Yes Gherghe, Costin M, MD  escitalopram  (LEXAPRO ) 20 MG tablet Take 20 mg by mouth daily.  07/10/14  Yes [provider]  ferrous sulfate  325 (65 FE) MG tablet Take 1 tablet (325 mg total) by mouth daily with breakfast. Patient taking differently: Take 325 mg by mouth every other day. Monday, Wednesday, Friday 07/13/18  Yes Gherghe, Costin M, MD  losartan  (COZAAR ) 25 MG tablet Take 25 mg by mouth daily. 09/29/23  Yes [provider]  pantoprazole  (PROTONIX ) 40 MG tablet Take 1 tablet (40 mg total) by mouth daily. 07/13/18  Yes Gherghe, Costin M, MD  vitamin E 180 MG (400 UNITS) capsule Take 400 Units by mouth daily.   Yes [provider]      The patient is critically ill due to acute hypoxic respiratroy failure requiring mechanical ventilation.  Critical care was necessary to treat or prevent imminent or life-threatening deterioration. Critical care time was spent by me on the following activities: development of a treatment plan with the patient and/or surrogate as well as nursing, discussions with consultants, evaluation of the patient's response to treatment, examination of the patient, obtaining a history from the patient or surrogate, ordering and performing treatments and interventions, ordering and review of laboratory studies, ordering and review of radiographic studies, review of telemetry data including pulse oximetry, re-evaluation of patient's condition and participation in multidisciplinary rounds.   I  personally spent 51 minutes providing critical care not including any separately billable procedures.   Zola LOISE Herter, MD [facility] 10/24/2023 10:10 AM

## 2023-10-24 NOTE — Progress Notes (Signed)
 ABG obtained on ventilator settings of VT: 360, RR: 28, FIO2: 70%, and PEEP: 5.  Decreased FIO2 to 60%.  No other vent changes made at this time.  Will continue to monitor.    Latest Reference Range & Units 10/24/23 14:49  Sample type  ARTERIAL  pH, Arterial 7.35 - 7.45  7.164 (LL)  pCO2 arterial 32 - 48 mmHg 67.9 (HH)  pO2, Arterial 83 - 108 mmHg 99  TCO2 22 - 32 mmol/L 26  Acid-base deficit 0.0 - 2.0 mmol/L 4.0 (H)  Bicarbonate 20.0 - 28.0 mmol/L 24.3  O2 Saturation % 95  Patient temperature  99.3 F  Collection site  RADIAL, ALLEN'S TEST ACCEPTABLE

## 2023-10-24 NOTE — Progress Notes (Signed)
 ABG ordered and obtained on ventilator settings of PC: 18, RR: 16, FIO2: 80%, and PEEP: 5.  Results given to MD.  Ventilator changes made per MD.  RT to obtain follow up ABG in an hour.  Will continue to monitor.    Latest Reference Range & Units 10/24/23 11:19  Sample type  ARTERIAL  pH, Arterial 7.35 - 7.45  7.002 (LL)  pCO2 arterial 32 - 48 mmHg 102.5 (HH)  pO2, Arterial 83 - 108 mmHg 131 (H)  TCO2 22 - 32 mmol/L 28  Acid-base deficit 0.0 - 2.0 mmol/L 6.0 (H)  Bicarbonate 20.0 - 28.0 mmol/L 25.4  O2 Saturation % 96  Patient temperature  98.7 F  Collection site  RADIAL, ALLEN'S TEST ACCEPTABLE

## 2023-10-25 DIAGNOSIS — Z515 Encounter for palliative care: Secondary | ICD-10-CM

## 2023-10-25 DIAGNOSIS — Z66 Do not resuscitate: Secondary | ICD-10-CM | POA: Diagnosis not present

## 2023-10-25 DIAGNOSIS — Z7189 Other specified counseling: Secondary | ICD-10-CM

## 2023-10-25 LAB — BASIC METABOLIC PANEL WITH GFR
Anion gap: 16 — ABNORMAL HIGH (ref 5–15)
BUN: 125 mg/dL — ABNORMAL HIGH (ref 8–23)
CO2: 20 mmol/L — ABNORMAL LOW (ref 22–32)
Calcium: 7.8 mg/dL — ABNORMAL LOW (ref 8.9–10.3)
Chloride: 103 mmol/L (ref 98–111)
Creatinine, Ser: 4.41 mg/dL — ABNORMAL HIGH (ref 0.44–1.00)
GFR, Estimated: 10 mL/min — ABNORMAL LOW (ref >60)
Glucose, Bld: 139 mg/dL — ABNORMAL HIGH (ref 70–99)
Potassium: 5.9 mmol/L — ABNORMAL HIGH (ref 3.5–5.1)
Sodium: 139 mmol/L (ref 135–145)

## 2023-10-25 LAB — POCT I-STAT 7, (LYTES, BLD GAS, ICA,H+H)
Acid-base deficit: 3 mmol/L — ABNORMAL HIGH (ref 0.0–2.0)
Bicarbonate: 26.5 mmol/L (ref 20.0–28.0)
Calcium, Ion: 1.07 mmol/L — ABNORMAL LOW (ref 1.15–1.40)
HCT: 41 % (ref 36.0–46.0)
Hemoglobin: 13.9 g/dL (ref 12.0–15.0)
O2 Saturation: 97 %
Potassium: 5.2 mmol/L — ABNORMAL HIGH (ref 3.5–5.1)
Sodium: 140 mmol/L (ref 135–145)
TCO2: 29 mmol/L (ref 22–32)
pCO2 arterial: 68.2 mmHg (ref 32–48)
pH, Arterial: 7.198 — CL (ref 7.35–7.45)
pO2, Arterial: 120 mmHg — ABNORMAL HIGH (ref 83–108)

## 2023-10-25 LAB — CBC WITH DIFFERENTIAL/PLATELET
Abs Immature Granulocytes: 12.16 K/uL — ABNORMAL HIGH (ref 0.00–0.07)
Basophils Absolute: 0 K/uL (ref 0.0–0.1)
Basophils Relative: 0 %
Eosinophils Absolute: 0.1 K/uL (ref 0.0–0.5)
Eosinophils Relative: 0 %
HCT: 23.2 % — ABNORMAL LOW (ref 36.0–46.0)
Hemoglobin: 7 g/dL — ABNORMAL LOW (ref 12.0–15.0)
Immature Granulocytes: 26 %
Lymphocytes Relative: 4 %
Lymphs Abs: 1.9 K/uL (ref 0.7–4.0)
MCH: 25.2 pg — ABNORMAL LOW (ref 26.0–34.0)
MCHC: 30.2 g/dL (ref 30.0–36.0)
MCV: 83.5 fL (ref 80.0–100.0)
Monocytes Absolute: 2.6 K/uL — ABNORMAL HIGH (ref 0.1–1.0)
Monocytes Relative: 6 %
Neutro Abs: 29.9 K/uL — ABNORMAL HIGH (ref 1.7–7.7)
Neutrophils Relative %: 64 %
Platelets: 363 K/uL (ref 150–400)
RBC: 2.78 MIL/uL — ABNORMAL LOW (ref 3.87–5.11)
RDW: 17.6 % — ABNORMAL HIGH (ref 11.5–15.5)
Smear Review: NORMAL
WBC: 46.6 K/uL — ABNORMAL HIGH (ref 4.0–10.5)
nRBC: 0.5 % — ABNORMAL HIGH (ref 0.0–0.2)

## 2023-10-25 LAB — MAGNESIUM: Magnesium: 2.7 mg/dL — ABNORMAL HIGH (ref 1.7–2.4)

## 2023-10-25 LAB — GLUCOSE, CAPILLARY
Glucose-Capillary: 122 mg/dL — ABNORMAL HIGH (ref 70–99)
Glucose-Capillary: 167 mg/dL — ABNORMAL HIGH (ref 70–99)
Glucose-Capillary: 138 mg/dL — ABNORMAL HIGH (ref 70–99)
Glucose-Capillary: 146 mg/dL — ABNORMAL HIGH (ref 70–99)
Glucose-Capillary: 207 mg/dL — ABNORMAL HIGH (ref 70–99)

## 2023-10-25 LAB — PATHOLOGIST SMEAR REVIEW

## 2023-10-25 MED ORDER — DEXTROSE 50 % IV SOLN
1.0000 | Freq: Once | INTRAVENOUS | Status: AC
  Administered 2023-10-25: 50 mL via INTRAVENOUS
  Filled 2023-10-25: qty 50

## 2023-10-25 MED ORDER — NUTRISOURCE FIBER PO PACK
1.0000 | PACK | Freq: Two times a day (BID) | ORAL | Status: DC
  Administered 2023-10-25 – 2023-10-26 (×3): 1
  Filled 2023-10-25 (×4): qty 1

## 2023-10-25 MED ORDER — SODIUM ZIRCONIUM CYCLOSILICATE 10 G PO PACK
10.0000 g | PACK | Freq: Once | ORAL | Status: AC
  Administered 2023-10-25: 10 g
  Filled 2023-10-25: qty 1

## 2023-10-25 MED ORDER — SODIUM BICARBONATE 8.4 % IV SOLN
100.0000 meq | Freq: Once | INTRAVENOUS | Status: AC
  Administered 2023-10-25: 100 meq via INTRAVENOUS
  Filled 2023-10-25: qty 50

## 2023-10-25 MED ORDER — INSULIN ASPART 100 UNIT/ML IV SOLN
10.0000 [IU] | Freq: Once | INTRAVENOUS | Status: AC
  Administered 2023-10-25: 10 [IU] via INTRAVENOUS

## 2023-10-25 MED ORDER — OSMOLITE 1.5 CAL PO LIQD
1000.0000 mL | ORAL | Status: DC
  Administered 2023-10-25 – 2023-10-26 (×2): 1000 mL
  Filled 2023-10-25 (×2): qty 1000

## 2023-10-25 NOTE — Progress Notes (Signed)
 [facility] Progress Note   Subjective:   Remains intubated and sedated - worsening ventilation yesteday.  No pressor support.  UOP dropped to yesteday. BUN/Cr up to 125/4.4 today. ABG this AM 7.2 / 68.    Objective Vitals:   10/25/23 0445 10/25/23 0500 10/25/23 0515 10/25/23 0600  BP: (!) 125/53 (!) 133/51 (!) 139/57 (!) 141/56  Pulse: 93 92 95 94  Resp: (!) 24 (!) 26 (!) 24 (!) 25  Temp:      TempSrc:      SpO2: 98% 98% 97% 98%  Weight:      Height:       Physical Exam Gen on vent, sedated, elderly WF Sclera anicteric, throat w/ ETT No jvd or bruits Chest clear anterior/ lateral RRR no MRG Abd soft  GU foley with small amount of urine Ext no LE edema, 1+ bilat UE edema Neuro is on vent, sedated  Additional Objective Labs: Basic Metabolic Panel: Recent Labs  Lab 10/21/23 0239 10/22/23 0235 10/22/23 1528 10/23/23 0255 10/24/23 0533 10/24/23 1119 10/24/23 1449 10/25/23 0351 10/25/23 0745  NA 134* 135   < > 132* 137   < > 138 139 140  K 3.4* 5.1   < > 4.3 5.6*   < > 5.8* 5.9* 5.2*  CL 99 102   < > 98 101  --   --  103  --   CO2 19* 20*   < > 17* 22  --   --  20*  --   GLUCOSE 162* 198*   < > 190* 154*  --   --  139*  --   BUN 53* 73*   < > 87* 105*  --   --  125*  --   CREATININE 2.29* 2.97*   < > 3.19* 3.41*  --   --  4.41*  --   CALCIUM  7.7* 7.7*   < > 7.2* 7.8*  --   --  7.8*  --   PHOS 5.0* 6.9*  --  5.9*  --   --   --   --   --    < > = values in this interval not displayed.   Liver Function Tests: Recent Labs  Lab 10/18/23 0937 10/19/23 0428  AST 26 24  ALT 13 14  ALKPHOS 107 103  BILITOT 1.3* 0.8  PROT 6.5 5.8*  ALBUMIN 2.1* 1.9*   No results for input(s): LIPASE, AMYLASE in the last 168 hours. CBC: Recent Labs  Lab 10/18/23 0937 10/18/23 0959 10/22/23 0235 10/23/23 0255 10/23/23 1940 10/24/23 0533 10/24/23 1119 10/24/23 1449 10/25/23 0351 10/25/23 0745  WBC 18.2*   < > 37.6* 39.4* 46.3* 62.9*  --   --   46.6*  --   NEUTROABS 14.1*  --   --   --   --   --   --   --  29.9*  --   HGB 9.4*   < > 8.2* 7.6* 7.9* 8.2*   < > 8.2* 7.0* 13.9  HCT 30.1*   < > 26.6* 24.6* 25.7* 27.2*   < > 24.0* 23.2* 41.0  MCV 79.0*   < > 81.8 81.5 82.1 84.5  --   --  83.5  --   PLT 473*   < > 509* 415* 409* 499*  --   --  363  --    < > = values in this interval not displayed.   Blood Culture    Component Value Date/Time  SDES TRACHEAL ASPIRATE 10/22/2023 0851   SPECREQUEST NONE 10/22/2023 0851   CULT FEW CANDIDA ALBICANS 10/22/2023 0851   REPTSTATUS 10/24/2023 FINAL 10/22/2023 0851    Cardiac Enzymes: No results for input(s): CKTOTAL, CKMB, CKMBINDEX, TROPONINI in the last 168 hours. CBG: Recent Labs  Lab 10/24/23 1522 10/24/23 1950 10/24/23 2329 10/25/23 0338 10/25/23 0735  GLUCAP 145* 163* 141* 122* 167*   Iron Studies: No results for input(s): IRON, TIBC, TRANSFERRIN, FERRITIN in the last 72 hours. @lablastinr3 @ Studies/Results: DG CHEST PORT 1 VIEW Result Date: 10/24/2023 CLINICAL DATA:  Shortness of breath.  Hypoxia. EXAM: PORTABLE CHEST 1 VIEW COMPARISON:  10/23/2023 FINDINGS: Endotracheal tube in satisfactory position. Nasogastric tube extending into the stomach with its tip and side hole not included. Normal-sized heart. Increased right basilar airspace opacity with consolidation and an interval small right pleural effusion. Mild left basilar atelectasis. Diffusely prominent interstitial markings without significant change. Normal pulmonary vascularity. Diffuse osteopenia. IMPRESSION: 1. Increased right basilar pneumonia with an interval small right pleural effusion. 2. Mild left basilar atelectasis. 3. Stable diffuse chronic interstitial lung disease. Electronically Signed   By: Elspeth Bathe M.D.   On: 10/24/2023 15:05   DG CHEST PORT 1 VIEW Result Date: 10/23/2023 CLINICAL DATA:  Fever and shortness of breath. EXAM: PORTABLE CHEST 1 VIEW COMPARISON:  10/19/2023 FINDINGS:  Endotracheal tube tip 5.5 cm above the carina. Nasogastric tube extending into the stomach with the side hole in the mid stomach and tip not included. The cardiac silhouette remains borderline enlarged. Prominent patchy density at the right lung base, mildly improved. Tortuous and partially calcified thoracic aorta. No definite residual pleural fluid seen on either side. Unremarkable bones. IMPRESSION: 1. Mildly improved right basilar pneumonia. 2. No definite residual pleural fluid seen on either side. 3. Borderline cardiomegaly. Electronically Signed   By: Elspeth Bathe M.D.   On: 10/23/2023 16:14   Medications:  dexmedetomidine (PRECEDEX) IV infusion 0.4 mcg/kg/hr (10/24/23 1100)   feeding supplement (OSMOLITE 1.2 CAL) 50 mL/hr at 10/25/23 0500   fentaNYL  infusion INTRAVENOUS 50 mcg/hr (10/25/23 0500)   propofol  (DIPRIVAN ) infusion 35 mcg/kg/min (10/25/23 0510)    arformoterol  15 mcg Nebulization BID   aspirin   81 mg Per Tube Daily   Or   aspirin  EC  81 mg Oral Daily   atorvastatin   10 mg Per Tube Daily   Chlorhexidine  Gluconate Cloth  6 each Topical Daily   docusate  100 mg Per Tube BID   escitalopram   20 mg Per Tube Daily   feeding supplement (PROSource TF20)  60 mL Per Tube Daily   fentaNYL  (SUBLIMAZE ) injection  25-50 mcg Intravenous Once   folic acid  1 mg Per Tube Daily   heparin injection (subcutaneous)  5,000 Units Subcutaneous Q8H   insulin aspart  0-15 Units Subcutaneous Q4H   multivitamin with minerals  1 tablet Per Tube Daily   mouth rinse  15 mL Mouth Rinse Q2H   pantoprazole  (PROTONIX ) IV  40 mg Intravenous QHS   polyethylene glycol  17 g Per Tube Daily   predniSONE  40 mg Per Tube Q breakfast   revefenacin  175 mcg Nebulization Daily   sodium chloride  flush  3 mL Intravenous Q12H     Assessment/ Plan: AKI on CKD 3a: b/l creatinine 1.1- 1.4 from 2023, eGFR 41- 53 ml/min. Now with mutlifactorial AKI (contrast, prerenal progressed to ATN) with worsening kidney function  and progression to oliguria now.  Clinically her overall trajectory is not improving at this time  and I suspect her kidneys will continue to worsen.  If patient's care remains full scope will likely develop indications for RRT in the coming 24-48h.  I doubt dialysis will change her overall poor prognosis but if desired by family we could do a time limited trial.  Pall care and primary to have discussion with POA today re: GOC.  I discussed with both of them at length.  PNA/ sepsis: per CCM, grew staph aureus in sputum culture - s/p Abx.  Atrial fib: in NSR here, on heparin gtt. H/o CVA Anemia:  Hb 7s 8-s, transfusion per primary Concern for lung cancer vs abscess Renal cyst:  appearing benign on 10/21/23 US .   As above GOC to be addressed today - if full scope dialysis likely in next 1-2 days but I believe comfort care would be most appropriate as her prognosis is fairly poor and she's only continued to worsen over the last few days despite aggrssive care now on hospital day 7.   Manuelita Barters MD 10/25/2023, 8:01 AM  [facility] Pager: [phone]

## 2023-10-25 NOTE — Progress Notes (Addendum)
 Nutrition Follow-up  DOCUMENTATION CODES:  Severe malnutrition in context of acute illness/injury  INTERVENTION:  Continue tube feeding via OGT. Adjust to the following: Osmolite 1.5 at 40 ml/h (960 ml per day) Prosource TF20 60 ml 1x/d Provides 1520 kcal, 80 gm protein, 732 ml free water daily MVI with minerals daily, folic acid daily for deficiency Nutrasource fiber added BID to attempt to add bulk to stool.  NUTRITION DIAGNOSIS:  Severe Malnutrition related to acute illness (PNA?) as evidenced by moderate fat depletion, moderate muscle depletion. 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 Presented with breathing issues and diarrhea and found to have acute respiratory failure w/ hypoxia. Right lower lung mass found on CT with concern for cavitary pneumonia or malignancy.  10/1 - presented to ED 10/2 - rapid response, intubation, bronchoscopy, transferred to 64M 10/3 - TF initiated 10/7 - worsening ventilation and renal function  Pt resting in bed at the time of assessment. Acute decline starting yesterday with ability to ventilate and declining renal function. Potassium remains high this AM after lokelma had to be given yesterday. Poor urine production. Adjusted TF to a more concentrated formula to decrease volume and some electrolytes.  Pt discussed during ICU rounds and with RN and MD. PMT now following and has spoken to family about pt's current clinical picture. Pt now a DNR. Further discussions to be held this afternoon about GOC and overall trajectory.    MV:  12.9 L/min Temp (24hrs), Avg:98.4 F (36.9 C), Min:97.4 F (36.3 C), Max:99.2 F (37.3 C) MAP (cuff): 80-41mmHg this AM  Propofol : 11.91 ml/hr  Admit weight: 56.7 kg   Current weight: 57 kg    Intake/Output Summary (Last 24 hours) at 10/25/2023 1159 Last data filed at 10/25/2023 1000 Gross per 24 hour  Intake 1484.43 ml  Output 210 ml  Net 1274.43 ml  Net IO Since Admission: 9,200.22 mL [10/25/23 1159]  Drains/Lines: OGT 16 Fr. (Gastric) UOP x 24 hours FMS x 24 hours  Average Meal Intake: 10/4: 0% intake x 1 recorded meals  Nutritionally Relevant Medications: Scheduled Meds:  atorvastatin   10 mg Per Tube Daily   docusate  100 mg Per Tube BID   PROSource TF20  60 mL Per Tube Daily   folic acid  1 mg Per Tube Daily   insulin aspart  0-15 Units Subcutaneous Q4H   multivitamin with minerals  1 tablet Per Tube Daily   Pantoprazole  IV  40 mg Intravenous QHS   polyethylene glycol  17 g Per Tube Daily   predniSONE  40 mg Per Tube Q breakfast   Continuous Infusions:  feeding supplement (OSMOLITE 1.2 CAL) 50 mL/hr at 10/25/23 0500   propofol  (DIPRIVAN ) infusion 35 mcg/kg/min (10/25/23 0510)   PRN Meds: ondansetron   Labs Reviewed: Potassium 5.2 BUN 125, creatinine 4.41 Magnesium 2.7 CBG ranges from 122-194 mg/dL over the last 24 hours HgbA1c 6.4% (10/3)  Micronutrient Profile CRP 44.3 (10/1) Vitamin B12 310 Folate <3.0 (will order replacement)  NUTRITION - FOCUSED PHYSICAL EXAM: Flowsheet Row Most Recent Value  Orbital Region Moderate depletion  Upper Arm Region  Moderate depletion  Thoracic and Lumbar Region Severe depletion  Buccal Region Mild depletion  Temple Region Mild depletion  Clavicle Bone Region Moderate depletion  Clavicle and Acromion Bone Region Moderate depletion  Scapular Bone Region Mild depletion  Dorsal Hand Severe depletion  Patellar Region Unable to assess  Anterior Thigh Region Moderate depletion  Posterior Calf Region Mild  depletion  Edema (RD Assessment) None  Hair Reviewed  Eyes Reviewed  Mouth Reviewed  Skin Reviewed  Nails Reviewed    Diet Order:   Diet Order             Diet NPO time specified  Diet effective now                   EDUCATION NEEDS:  Not appropriate for education at this time  Skin:  Skin Assessment: Reviewed RN Assessment  Last BM:  10/7 - type 7 FMS placed 10/5  Height:  Ht Readings from Last 1 Encounters:  10/25/23 5' (1.524 m)   Weight:  Wt Readings from Last 1 Encounters:  10/25/23 65.6 kg    Ideal Body Weight:  45.5 kg  BMI:  Body mass index is 28.24 kg/m.  Estimated Nutritional Needs:  Kcal:  1500-1700 kcals Protein:  75-90g Fluid:  1.4-1.6L/day    Vernell Lukes, RD, LDN, CNSC Registered Dietitian II Please reach out via secure chat

## 2023-10-25 NOTE — Consult Note (Addendum)
 Palliative Medicine Inpatient Consult Note  Consulting Provider: Dr. Zaida  Reason for consult:   Palliative Care Consult Services Palliative Medicine Consult  Reason for Consult? Lung abscess vs malignancy, multiorgan failure on the vent, AKI   10/25/2023  HPI:  Per intake H&P --> 78 yoF with hx of former smoker (quit 2020), emphysema, CVA on plavix , HTN, HLD, GERD, anxiety, depression and GIB (AVMs-duodenal, cecal and right colon), OSA (denies), PVD, CKD3a, IDA, 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 Presented from independent living facility after found to have noisy breathing and several days of progressive SOB found to be hypoxic in the 80's. Identified to have a RLL cavitary lung mass. Has had worsening respiratory state despite vent support. Is now going into kidney failure. Patients family face decisions over whether or not to pursue ongoing aggressive measures such as dialysis versus allowing comfort.   Clinical Assessment/Goals of Care:  *Please note that this is a verbal dictation therefore any spelling or grammatical errors are due to the Dragon Medical One system interpretation.  I have reviewed medical records including EPIC notes, labs and imaging, received report from bedside RN, assessed the patient who is lying in bed on ventilatory support on both fentanyl  and propofol .    I called patients brother, Norleen and sister in law, Tilton to further discuss diagnosis prognosis, GOC, EOL wishes, disposition and options.   I introduced Palliative Medicine as specialized medical care for people living with serious illness. It focuses on providing relief from the symptoms and stress of a serious illness. The goal is to improve quality of life for both the patient and the family.  Medical History Review and Understanding:  A review of Tyshay's past medical history significant for CVA, hypertension, hyperlipidemia, anxiety/depression, GERD, previous GI bleed,  squamous cell carcinoma of the tongue, and emphysema was completed.  Social History:  Bryana is originally from [place]  however when she went to college she transition to living in [place]  where she went to [employer].  Per her brother she never went back home.  Preslei was married and is divorced.  She does not have any children.  Janalyn worked as a Furniture conservator/restorer.  She is a woman of faith practicing within [employer]ism.  Functional and Nutritional State:  Prior to hospitalization Destanee was living at [facility]  Advance Directives:  A detailed discussion was had today regarding advanced directives.  Patient does not have advanced directives on file. Her brother shares that a good friend of hers plans to go to her apartment to see if she can find anything.   Code Status:  Concepts specific to code status, artifical feeding and hydration, continued IV antibiotics and rehospitalization was had.  The difference between a aggressive medical intervention path  and a palliative comfort care path for this patient at this time was had.   Encouraged patient/family to consider DNR/DNI status understanding evidenced based poor outcomes in similar hospitalized patient, as the cause of arrest is likely associated with advanced chronic/terminal illness rather than an easily reversible acute cardio-pulmonary event. I explained that DNR/DNI does not change the medical plan and it only comes into effect after a person has arrested (died).  It is a protective measure to keep us  from harming the patient in their last moments of life. Norleen and Tilton are agreeable to DNR/DNI with understanding that patient would not receive CPR, defibrillation, ACLS medications, or intubation.   Discussion:  We reviewed Phyllis prolonged hospitalization in  the setting of her hypoxic respiratory failure and newly identified lung lesion. We reviewed her now declining kidney function and  the decisions which are being faced such as whether or not to start dialysis treatments. We discussed the possible outcomes if dialysis is pursued, patients sister in law feels that Ednamae is unlikely to do well with this intervention.  John and Tilton note that Angelicia was a deeply private person and they did see her though infrequently. They note above all that they do not want her to suffer. We reviewed the path of aggressive medical care inclusive of continued ventilation, biopsy of lung (if she is stable), and initiation of dialysis. We reviewed if a biopsy is pursued, they question of whether or not Kimberlie at this juncture could even undergo aggressive treatments of cancer.   We reviewed what a prolonged hospitalization due to critical illness can do to an already fragile individual.   I shared the alternative measure(s) of comfort care. We reviewed that patient would be extubated, invasive procedures would be minimized, and medications would be offered for comfort. We discussed allowing dignity and quality at end of life.  Norleen and Robins share they feel a comfort path would likely be best though they would like the reassurance of speaking to the critical care doctor, Dr. Zaida prior to a decision. I shared that I would reach out to him for them to share in additional conversations.   Discussed the importance of continued conversation with family and their  medical providers regarding overall plan of care and treatment options, ensuring decisions are within the context of the patients values and GOCs. ____________________________ Addendum:  Patients sister in law, Tilton and brother, Norleen called back and asked if we could ensure that Shariece's last rights are given as she is a Immunologist. [employer].   They vocalized the goal to transition to comfort care but requested to wait until the morning.  ______________________________ Addendum 2:  Family called and shared this is a  very tough situation. They note that it's even more complicated being at a distance and want to further wait until Friday to make decision(s) related to transition to comfort care.   Add time: 35  Decision Maker: Flanigan,John (Brother): [phone] (Home Phone)   SUMMARY OF RECOMMENDATIONS   DNAR  Patients brother and sister in law are leaning towards comfort care though would like an update from Dr. Zaida  Have discussed patient likely outcome(s) in the setting of her current disease burden  Ongoing PMT support  Code Status/Advance Care Planning: DNAR  Palliative Prophylaxis:  Aspiration, Bowel Regimen, Delirium Protocol, Frequent Pain Assessment, Oral Care, Palliative Wound Care, and Turn Reposition  Additional Recommendations (Limitations, Scope, Preferences): Continue current care  Psycho-social/Spiritual:  Desire for further Chaplaincy support: Yes Additional Recommendations: Education on MSOF and possible outcomes.    Prognosis: Limited overall.   Discharge Planning: To be determined.   Vitals:   10/25/23 1030 10/25/23 1045  BP: (!) 118/50 (!) 122/49  Pulse: 90 92  Resp: (!) 25 (!) 28  Temp:    SpO2: 98% 98%    Intake/Output Summary (Last 24 hours) at 10/25/2023 1056 Last data filed at 10/25/2023 1000 Gross per 24 hour  Intake 1545.14 ml  Output 210 ml  Net 1335.14 ml   Last Weight  Most recent update: 10/25/2023  2:53 AM    Weight  65.6 kg (144 lb 10 oz)            LABS:  CBC:    Component Value Date/Time   WBC 46.6 (H) 10/25/2023 0351   HGB 13.9 10/25/2023 0745   HCT 41.0 10/25/2023 0745   PLT 363 10/25/2023 0351   MCV 83.5 10/25/2023 0351   NEUTROABS 29.9 (H) 10/25/2023 0351   LYMPHSABS 1.9 10/25/2023 0351   MONOABS 2.6 (H) 10/25/2023 0351   EOSABS 0.1 10/25/2023 0351   BASOSABS 0.0 10/25/2023 0351   Comprehensive Metabolic Panel:    Component Value Date/Time   NA 140 10/25/2023 0745   K 5.2 (H) 10/25/2023 0745   CL 103 10/25/2023  0351   CO2 20 (L) 10/25/2023 0351   BUN 125 (H) 10/25/2023 0351   CREATININE 4.41 (H) 10/25/2023 0351   GLUCOSE 139 (H) 10/25/2023 0351   CALCIUM  7.8 (L) 10/25/2023 0351   AST 24 10/19/2023 0428   ALT 14 10/19/2023 0428   ALKPHOS 103 10/19/2023 0428   BILITOT 0.8 10/19/2023 0428   PROT 5.8 (L) 10/19/2023 0428   ALBUMIN 1.9 (L) 10/19/2023 0428   Gen:  Elderly Caucasian F chronically ill appearing HEENT: ETT, dry mucous membranes CV: Regular rate and rhythm  PULM:  On mechanical ventilator ABD: soft/nontender  EXT: Generalized edema  Neuro: On sedation  PPS: 10%   This conversation/these recommendations were discussed with patient primary care team, Dr. Zaida ______________________________________________________ Rosaline Becton [facility] Palliative Medicine Team Team Cell Phone: [phone] Please utilize secure chat with additional questions, if there is no response within 30 minutes please call the above phone number  Total Time: 90 Billing based on MDM: High  Palliative Medicine Team providers are available by phone from 7am to 7pm daily and can be reached through the team cell phone.  Should this patient require assistance outside of these hours, please call the patient's attending physician.

## 2023-10-25 NOTE — Progress Notes (Signed)
 eLink Physician-Brief Progress Note Patient Name: JEANAE WHITMILL DOB: [birth year] MRN: [id number]   Date of Service  10/25/2023  HPI/Events of Note  Persistent hyperkalemia  eICU Interventions  HyperK protocol     Intervention Category Intermediate Interventions: Electrolyte abnormality - evaluation and management  Avalie Oconnor 10/25/2023, 5:13 AM

## 2023-10-25 NOTE — Progress Notes (Addendum)
 NAME:  [name], MRN:  [id number], DOB:  1945-07-01, LOS: 7 ADMISSION DATE:  10/18/2023 CHIEF COMPLAINT:  SOB.    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  Presented from independent living facility after found to have noisy breathing and several days of progressive SOB found to be hypoxic in the 80's.  Pt states has been SOB for 3 days with new dry cough and poor PO intake related to dyspnea.  Denies fever, chills, chest pain, wt loss (although appears to have 19lb wt loss since 01/2023), hemoptysis, dysphagia or choking episodes. Does not use oxygen, nebs/ inhalers at baseline and denies OSA.    Treated in ER with O2, nebs, solumedrol.  Afebrile in ER, requiring 6L.  WBC 18.2, Hgb 9.4, sCr 1.48, ABG 7.414/ 29/ 153/ 18.6.  SARS/ flu/ RSV neg. CXR concerning for RLL caviatry lesion.  Empiric vancomycin  and zosyn started. CTA PE obtained, negative for PE, but extensive abnormality of RLL, RML and basilar aspect of RUE with mass like consolidation in RLL with multiple airway occlusions including endobronchial tumor/ debris and large 8.5 cm cavitary lesion of RLL with communication directly into lower lobe bronchi concerning for extensive pulmonary malignancy, pneumonia remains in differential.  Abnormal bronchovascular distribution within lingula extending into subpleural surface.  Also noted right renal abnormal contour suspicious for solid renal mass. Admitted to [facility] with Pulmonary consulting for further recomendations.   Pertinent  Medical History  former smoker (quit 2018), emphysema, CVA on plavix , HTN, HLD, GERD, anxiety, depression and GIB (AVMs-duodenal, cecal and right colon), OSA (denies), PVD, CKD3a, IDA, squamous cell carcinoma of tongue s/p excision  (2018)  Interim History / Subjective:  Pressure support trial failed as the patient became tachypneic.  Worsening white cell count today along with worsening creatinine. Significant secretions suctioned in the morning.  Significant Hospital Events: 10/1: admitted.  10/2: intubated.   Objective    Blood pressure (!) 121/55, pulse 95, temperature (!) 97.4 F (36.3 C), temperature source Axillary, resp. rate (!) 21, height 5' (1.524 m), weight 65.6 kg, SpO2 99%.    Vent Mode: PRVC FiO2 (%):  [60 %-70 %] 60 % Set Rate:  [28 bmp] 28 bmp Vt Set:  [360 mL] 360 mL PEEP:  [5 cmH20] 5 cmH20 Plateau Pressure:  [20 cmH20-27 cmH20] 20 cmH20   Intake/Output Summary (Last 24 hours) at 10/25/2023 0920 Last data filed at 10/25/2023 0500 Gross per 24 hour  Intake 1320.91 ml  Output 210 ml  Net 1110.91 ml   Filed Weights   10/21/23 0600 10/24/23 0701 10/25/23 0253  Weight: 57.1 kg 66.6 kg 65.6 kg    Examination: General:  elderly female, ill appearing  Lungs:  bilateral wheezing, decreased air entry on the right lung base  Heart: regular rate rhythm, no murmur appreciated.  Abdomen: non tender, non distended. Normal BS.  Neuro: Sedated   Assessment and Plan   Neuro History of CVA: -Aspirin  and statin for now. -Plavix  was changed to aspirin  on 10/19/23 while being transferred to the ICU if need for procedures. - Switch Prop to precedex  - Continue PRN fent    Pulm Right lower lobe cavitary lung mass with opacity in right middle lobe and left lingula-lung abscess, will need to rule out malignancy:  Former smoker  and prior tongue SCC: Hypoxic respiratory failure, mechanical ventilation: COPD exacerbation: - Lung opacity likely malignancy versus lung abscess. - Intubated.  Transition to pressure control mode today. - Will stop antibiotics today  - Solu-Medrol 40mg  daily for 5 days(today is the last dose), scheduled DuoNebs - Vent bundle   Worsening respiratory status over the  past two days , added steroids again and long acting nebs. Changed her Vent settings to [facility]. Developed significant respiratory acidosis throughout the day that is now improving.   Discussed with Brother (HCP) poor overall clinical status. Family would like to transition to comfort potentially on Friday once they all gather and see the patient. In the meantime we will continue current care. They also transition code status to DNR. Will hold off on any invaisve procedures such as thoracentesis or bronch with biopsy of mass.    Cardiac/Vascular  A-fib: -Echo: EF normal with reduced RV function. - Low-dose metoprolol. - heparin gtt.    GI #GERD  Diet:  Tube feeds  GI PPX: PPI      ID Stopping Zyxox and zosyn today, will monitor off antibiotics, completed 7 day course,  concern for malignancy with post obstruvtive pneumonia  BAL  growing MSSA   Renal  Renal cyst: - Dedicated CT abdomen did not show any metastatic changes.  However did show change in density of the renal mass compared to before.  - Renal ultrasound performed shows simple renal cyst stable since last 2 years.     AKI on CKD stage IIIb: Anuria/oliguria: -No improvement with IV fluids.  Poor urine output. -Likely ATN.  UA done. - Nephrology consulted. - No indication for CRRT right now  - she is positive 6.5L , will stop fluids for now    Heme/Onc Switch to DVT ppx instead of heparin drip given report of some bloody stools overnight    Endo ISS: increase to moderate    Elevated FT4 - started on methimazole for FT4 of 1.32. Will dc.  -Will need TSH and free T4 as an outpatient.  If continued elevation of free T4 will need to be started on methimazole.   MSK/other  Severe Malnutrition Nutrition following    Updated brother Norleen on 10/3 who lives in [place] .  Updated Rock her neighbor on 10/3.  Tried reaching out Rock today went to voicemail.,  Labs   CBC: Recent Labs  Lab 10/18/23 [phone]  10/18/23 0959 10/22/23 0235 10/23/23 0255 10/23/23 1940 10/24/23 0533 10/24/23 1119 10/24/23 1237 10/24/23 1449 10/25/23 0351 10/25/23 0745  WBC 18.2*   < > 37.6* 39.4* 46.3* 62.9*  --   --   --  46.6*  --   NEUTROABS 14.1*  --   --   --   --   --   --   --   --  29.9*  --   HGB 9.4*   < > 8.2* 7.6* 7.9* 8.2* 8.5* 8.2* 8.2* 7.0* 13.9  HCT 30.1*   < > 26.6* 24.6* 25.7* 27.2* 25.0* 24.0* 24.0* 23.2* 41.0  MCV 79.0*   < > 81.8 81.5 82.1 84.5  --   --   --  83.5  --   PLT 473*   < > 509* 415* 409* 499*  --   --   --  363  --    < > = values in this interval not displayed.    Basic Metabolic Panel: Recent Labs  Lab 10/19/23 0744 10/19/23 1836 10/20/23 0412 10/20/23 1133 10/21/23  9760 10/22/23 0235 10/22/23 1528 10/22/23 2117 10/23/23 0255 10/24/23 0533 10/24/23 1119 10/24/23 1237 10/24/23 1449 10/25/23 0351 10/25/23 0745  NA 133*   < > 135   < > 134* 135 133*  --  132* 137 137 138 138 139 140  K 3.9   < > 3.9   < > 3.4* 5.1 5.6*   < > 4.3 5.6* 6.2* 6.0* 5.8* 5.9* 5.2*  CL 99  --  102  --  99 102 100  --  98 101  --   --   --  103  --   CO2 14*  --  17*  --  19* 20* 18*  --  17* 22  --   --   --  20*  --   GLUCOSE 139*  --  143*  --  162* 198* 201*  --  190* 154*  --   --   --  139*  --   BUN 34*  --  40*  --  53* 73* 83*  --  87* 105*  --   --   --  125*  --   CREATININE 1.27*  --  1.70*  --  2.29* 2.97* 3.20*  --  3.19* 3.41*  --   --   --  4.41*  --   CALCIUM  8.0*  --  7.9*  --  7.7* 7.7* 7.5*  --  7.2* 7.8*  --   --   --  7.8*  --   MG 2.2  --  2.0  --  2.5* 2.4  --   --  2.3 2.7*  --   --   --  2.7*  --   PHOS 4.1  --  4.9*  --  5.0* 6.9*  --   --  5.9*  --   --   --   --   --   --    < > = values in this interval not displayed.   GFR: Estimated Creatinine Clearance: 8.7 mL/min (A) (by C-G formula based on SCr of 4.41 mg/dL (H)). Recent Labs  Lab 10/18/23 0959 10/18/23 1420 10/19/23 0428 10/19/23 1832 10/20/23 0412 10/23/23 0255 10/23/23 1940 10/24/23 0533  10/25/23 0351  PROCALCITON  --  2.42  --  2.40  --   --   --   --   --   WBC  --   --    < >  --    < > 39.4* 46.3* 62.9* 46.6*  LATICACIDVEN 1.3  --   --   --   --   --   --   --   --    < > = values in this interval not displayed.    Liver Function Tests: Recent Labs  Lab 10/18/23 0937 10/19/23 0428  AST 26 24  ALT 13 14  ALKPHOS 107 103  BILITOT 1.3* 0.8  PROT 6.5 5.8*  ALBUMIN 2.1* 1.9*   No results for input(s): LIPASE, AMYLASE in the last 168 hours. No results for input(s): AMMONIA in the last 168 hours.  ABG    Component Value Date/Time   PHART 7.198 (LL) 10/25/2023 0745   PCO2ART 68.2 (HH) 10/25/2023 0745   PO2ART 120 (H) 10/25/2023 0745   HCO3 26.5 10/25/2023 0745   TCO2 29 10/25/2023 0745   ACIDBASEDEF 3.0 (H) 10/25/2023 0745   O2SAT 97 10/25/2023 0745      Past Medical History:  She,  has a past medical history of Asthma,  Cancer [facility]), Depression, Hypertension, Peripheral vascular disease, Pneumonia, Sleep apnea, and Stroke (HCC).   Surgical History:   Past Surgical History:  Procedure Laterality Date   BIOPSY  08/16/2021   Procedure: BIOPSY;  Surgeon: Elicia Claw, MD;  Location: [facility] ENDOSCOPY;  Service: Gastroenterology;;   COLONOSCOPY WITH PROPOFOL  N/A 08/16/2021   Procedure: COLONOSCOPY WITH PROPOFOL ;  Surgeon: Elicia Claw, MD;  Location: [facility] ENDOSCOPY;  Service: Gastroenterology;  Laterality: N/A;   DIRECT LARYNGOSCOPY Left 06/29/2016   Procedure: WIDE EXCISION LEFT TONGUE CANCER DIRECT LARYNGOSCOPY;  Surgeon: Arlana Arnt, MD;  Location: [facility] OR;  Service: ENT;  Laterality: Left;   ESOPHAGOGASTRODUODENOSCOPY (EGD) WITH PROPOFOL  Left 07/12/2018   Procedure: ESOPHAGOGASTRODUODENOSCOPY (EGD) WITH PROPOFOL ;  Surgeon: Dianna Specking, MD;  Location: [facility] ENDOSCOPY;  Service: Endoscopy;  Laterality: Left;   ESOPHAGOGASTRODUODENOSCOPY (EGD) WITH PROPOFOL  N/A 08/16/2021   Procedure: ESOPHAGOGASTRODUODENOSCOPY (EGD) WITH PROPOFOL ;  Surgeon:  Elicia Claw, MD;  Location: [facility] ENDOSCOPY;  Service: Gastroenterology;  Laterality: N/A;   ESOPHAGOSCOPY N/A 06/29/2016   Procedure: ESOPHAGOSCOPY/BRONCHOSCOPY;  Surgeon: Arlana Arnt, MD;  Location: [facility] OR;  Service: ENT;  Laterality: N/A;   EYE SURGERY Bilateral 2016   HEMOSTASIS CLIP PLACEMENT  08/16/2021   Procedure: HEMOSTASIS CLIP PLACEMENT;  Surgeon: Elicia Claw, MD;  Location: [facility] ENDOSCOPY;  Service: Gastroenterology;;   HOT HEMOSTASIS N/A 07/12/2018   Procedure: HOT HEMOSTASIS (ARGON PLASMA COAGULATION/BICAP);  Surgeon: Dianna Specking, MD;  Location: [facility] ENDOSCOPY;  Service: Endoscopy;  Laterality: N/A;   HOT HEMOSTASIS N/A 08/16/2021   Procedure: HOT HEMOSTASIS (ARGON PLASMA COAGULATION/BICAP);  Surgeon: Elicia Claw, MD;  Location: [facility] ENDOSCOPY;  Service: Gastroenterology;  Laterality: N/A;   KNEE SURGERY Right 2014   LOOP RECORDER INSERTION N/A 03/21/2016   Procedure: Loop Recorder Insertion;  Surgeon: Will Gladis Norton, MD;  Location: [facility] INVASIVE CV LAB;  Service: Cardiovascular;  Laterality: N/A;   PARS PLANA VITRECTOMY Right 02/28/2014   Procedure: PARS PLANA VITRECTOMY 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 WITH TAP AND ANTIBIOTIC INJECTION;  Surgeon: Arley DELENA Ruder, MD;  Location: [facility] OR;  Service: Ophthalmology;  Laterality: Right;   POLYPECTOMY  08/16/2021   Procedure: POLYPECTOMY;  Surgeon: Elicia Claw, MD;  Location: [facility] ENDOSCOPY;  Service: Gastroenterology;;   SCLEROTHERAPY  08/16/2021   Procedure: MATIAS;  Surgeon: Elicia Claw, MD;  Location: [facility] ENDOSCOPY;  Service: Gastroenterology;;   TEE WITHOUT CARDIOVERSION N/A 03/21/2016   Procedure: TRANSESOPHAGEAL ECHOCARDIOGRAM (TEE);  Surgeon: Annabella Scarce, MD;  Location: [facility] ENDOSCOPY;  Service: Cardiovascular;  Laterality: N/A;     Social History:   reports that she quit smoking about 7 years ago. She has never used smokeless tobacco. She reports that she does not drink alcohol  and does not use drugs.    Family History:  Her family history includes Cancer in her mother; Heart disease in her brother; Stroke in her father.   Allergies Allergies  Allergen Reactions   Codeine Itching and Other (See Comments)    Caused mouth to break out in hives   Neosporin [Neomycin-Bacitracin Zn-Polymyx] Itching and Swelling    Swelling at application site   Percocet [Oxycodone -Acetaminophen ] Other (See Comments)    B-blockers, triggers depression   Tussionex Pennkinetic Er [Hydrocod Poli-Chlorphe Poli Er] Itching     Home Medications  Prior to Admission medications   Medication Sig Start Date End Date Taking? Authorizing Provider  amLODipine  (NORVASC ) 10 MG tablet Take 1 tablet (10 mg total) by mouth daily. 05/09/18  Yes Ricky Fines, MD  atorvastatin  (LIPITOR) 10 MG tablet Take 10 mg by  mouth daily.  03/16/16  Yes [provider]  clopidogrel  (PLAVIX ) 75 MG tablet Take 1 tablet (75 mg total) by mouth daily. 07/17/18  Yes Gherghe, Costin M, MD  escitalopram  (LEXAPRO ) 20 MG tablet Take 20 mg by mouth daily.  07/10/14  Yes [provider]  ferrous sulfate  325 (65 FE) MG tablet Take 1 tablet (325 mg total) by mouth daily with breakfast. Patient taking differently: Take 325 mg by mouth every other day. Monday, Wednesday, Friday 07/13/18  Yes Gherghe, Costin M, MD  losartan  (COZAAR ) 25 MG tablet Take 25 mg by mouth daily. 09/29/23  Yes [provider]  pantoprazole  (PROTONIX ) 40 MG tablet Take 1 tablet (40 mg total) by mouth daily. 07/13/18  Yes Gherghe, Costin M, MD  vitamin E 180 MG (400 UNITS) capsule Take 400 Units by mouth daily.   Yes [provider]      The patient is critically ill due to acute hypoxic respiratroy failure requiring mechanical ventilation.  Critical care was necessary to treat or prevent imminent or life-threatening deterioration. Critical care time was spent by me on the following activities: development of a treatment plan with the patient and/or  surrogate as well as nursing, discussions with consultants, evaluation of the patient's response to treatment, examination of the patient, obtaining a history from the patient or surrogate, ordering and performing treatments and interventions, ordering and review of laboratory studies, ordering and review of radiographic studies, review of telemetry data including pulse oximetry, re-evaluation of patient's condition and participation in multidisciplinary rounds.   I personally spent 32 minutes providing critical care not including any separately billable procedures.   Zola LOISE Herter, MD [facility] 10/25/2023 9:20 AM

## 2023-10-25 NOTE — Progress Notes (Signed)
 ABG ordered and obtained on ventilator settings of VT: 360, RR: 28, FIO2: 60%, and PEEP: 5.  No vent changes made at this time.  Will continue to monitor.    Latest Reference Range & Units 10/25/23 07:45  Sample type  ARTERIAL  pH, Arterial 7.35 - 7.45  7.198 (LL)  pCO2 arterial 32 - 48 mmHg 68.2 (HH)  pO2, Arterial 83 - 108 mmHg 120 (H)  TCO2 22 - 32 mmol/L 29  Acid-base deficit 0.0 - 2.0 mmol/L 3.0 (H)  Bicarbonate 20.0 - 28.0 mmol/L 26.5  O2 Saturation % 97  Collection site  RADIAL, ALLEN'S TEST ACCEPTABLE

## 2023-10-26 LAB — CBC
HCT: 19.4 % — ABNORMAL LOW (ref 36.0–46.0)
Hemoglobin: 6 g/dL — CL (ref 12.0–15.0)
MCH: 25.5 pg — ABNORMAL LOW (ref 26.0–34.0)
MCHC: 30.9 g/dL (ref 30.0–36.0)
MCV: 82.6 fL (ref 80.0–100.0)
Platelets: 375 K/uL (ref 150–400)
RBC: 2.35 MIL/uL — ABNORMAL LOW (ref 3.87–5.11)
RDW: 18.1 % — ABNORMAL HIGH (ref 11.5–15.5)
WBC: 39.8 K/uL — ABNORMAL HIGH (ref 4.0–10.5)
nRBC: 0.2 % (ref 0.0–0.2)

## 2023-10-26 LAB — BASIC METABOLIC PANEL WITH GFR
Anion gap: 22 — ABNORMAL HIGH (ref 5–15)
BUN: 149 mg/dL — ABNORMAL HIGH (ref 8–23)
CO2: 15 mmol/L — ABNORMAL LOW (ref 22–32)
Calcium: 7.4 mg/dL — ABNORMAL LOW (ref 8.9–10.3)
Chloride: 102 mmol/L (ref 98–111)
Creatinine, Ser: 5.53 mg/dL — ABNORMAL HIGH (ref 0.44–1.00)
GFR, Estimated: 7 mL/min — ABNORMAL LOW (ref >60)
Glucose, Bld: 191 mg/dL — ABNORMAL HIGH (ref 70–99)
Potassium: 6.9 mmol/L (ref 3.5–5.1)
Sodium: 139 mmol/L (ref 135–145)

## 2023-10-26 LAB — GLUCOSE, CAPILLARY
Glucose-Capillary: 158 mg/dL — ABNORMAL HIGH (ref 70–99)
Glucose-Capillary: 167 mg/dL — ABNORMAL HIGH (ref 70–99)
Glucose-Capillary: 170 mg/dL — ABNORMAL HIGH (ref 70–99)
Glucose-Capillary: 171 mg/dL — ABNORMAL HIGH (ref 70–99)
Glucose-Capillary: 174 mg/dL — ABNORMAL HIGH (ref 70–99)

## 2023-10-26 LAB — TRIGLYCERIDES
Triglycerides: 106 mg/dL (ref <150)
Triglycerides: 109 mg/dL (ref <150)

## 2023-10-26 LAB — BLOOD GAS, VENOUS
Acid-base deficit: 4.1 mmol/L — ABNORMAL HIGH (ref 0.0–2.0)
Bicarbonate: 24.3 mmol/L (ref 20.0–28.0)
Drawn by: 67829
O2 Saturation: 90.9 %
Patient temperature: 36.4
pCO2, Ven: 57 mmHg (ref 44–60)
pH, Ven: 7.24 — ABNORMAL LOW (ref 7.25–7.43)
pO2, Ven: 57 mmHg — ABNORMAL HIGH (ref 32–45)

## 2023-10-26 LAB — MAGNESIUM: Magnesium: 2.7 mg/dL — ABNORMAL HIGH (ref 1.7–2.4)

## 2023-10-26 MED ORDER — GLYCOPYRROLATE 0.2 MG/ML IJ SOLN: 0.2000 mg | INTRAMUSCULAR | Status: DC | PRN

## 2023-10-26 MED ORDER — FENTANYL BOLUS VIA INFUSION
100.0000 ug | INTRAVENOUS | Status: DC | PRN
  Administered 2023-10-26 (×2): 100 ug via INTRAVENOUS

## 2023-10-26 MED ORDER — POLYVINYL ALCOHOL 1.4 % OP SOLN: 1.0000 [drp] | Freq: Four times a day (QID) | OPHTHALMIC | Status: DC | PRN

## 2023-10-26 MED ORDER — MIDAZOLAM HCL 2 MG/2ML IJ SOLN
2.0000 mg | INTRAMUSCULAR | Status: DC | PRN
  Administered 2023-10-26: 4 mg via INTRAVENOUS
  Filled 2023-10-26: qty 4

## 2023-10-26 MED ORDER — GLYCOPYRROLATE 0.2 MG/ML IJ SOLN
0.2000 mg | INTRAMUSCULAR | Status: DC | PRN
  Administered 2023-10-26: 0.2 mg via INTRAVENOUS
  Filled 2023-10-26: qty 1

## 2023-10-26 MED ORDER — FENTANYL 2500MCG IN NS 250ML (10MCG/ML) PREMIX INFUSION: 0.0000 ug/h | INTRAVENOUS | Status: DC

## 2023-10-26 MED ORDER — GLYCOPYRROLATE 1 MG PO TABS: 1.0000 mg | ORAL_TABLET | ORAL | Status: DC | PRN

## 2023-11-18 NOTE — Progress Notes (Signed)
 Nephrology quick note:   Palliative care discussed with family through the day yesterday and plan is for comfort care later this week after family has been able to visit with patient.  I fully support this transition.  I didn't see the patient today and don't plan to further, but please call me back if I can be of assistance.   Manuelita Barters MD [facility] Pager [phone]

## 2023-11-18 NOTE — Progress Notes (Signed)
 This chaplain responded to PMT NP-Michele consult for supporting the Pt. request for Anointing of the Sick.   The chaplain confirmed by phone with the Pt. brother-John, the priest from [place]. Pius visited on Tuesday. This chaplain updated Dr. Zaida and RN-Teresa after the conversation.   The chaplain prayed with the Pt. at the bedside per the family's request.  Chaplain Leeroy Hummer [phone]

## 2023-11-18 NOTE — TOC Progression Note (Addendum)
 Transition of Care [facility]) - Progression Note    Patient Details  Name: [name] MRN: [id number] Date of Birth: 1946/01/17  [facility] CM/SW Contact  Lauraine FORBES Saa, LCSWA Phone Number: 11/15/2023, 11:26 AM  Clinical Narrative:     11:27 AM Per progressions, patient is anticipated to transition to full comfort care tomorrow upon one way extubation. TOC will continue to follow.  Expected Discharge Plan: Home/Self Care Barriers to Discharge: Continued Medical Work up               Expected Discharge Plan and Services   Discharge Planning Services: CM Consult   Living arrangements for the past 2 months: Independent Living Facility                                       Social Drivers of Health (SDOH) Interventions SDOH Screenings   Food Insecurity: No Food Insecurity (10/18/2023)  Housing: Low Risk  (10/18/2023)  Transportation Needs: No Transportation Needs (10/18/2023)  Utilities: Not At Risk (10/18/2023)  Social Connections: Socially Isolated (10/18/2023)  Tobacco Use: Medium Risk (10/18/2023)    Readmission Risk Interventions     No data to display

## 2023-11-18 NOTE — Plan of Care (Signed)
  Problem: Education: Goal: Knowledge of General Education information will improve Description: Including pain rating scale, medication(s)/side effects and non-pharmacologic comfort measures Outcome: Not Progressing   Problem: Health Behavior/Discharge Planning: Goal: Ability to manage health-related needs will improve Outcome: Not Progressing   Problem: Clinical Measurements: Goal: Ability to maintain clinical measurements within normal limits will improve Outcome: Not Progressing Goal: Will remain free from infection Outcome: Not Progressing Goal: Diagnostic test results will improve Outcome: Not Progressing Goal: Respiratory complications will improve Outcome: Not Progressing Goal: Cardiovascular complication will be avoided Outcome: Not Progressing   Problem: Activity: Goal: Risk for activity intolerance will decrease Outcome: Not Progressing   Problem: Nutrition: Goal: Adequate nutrition will be maintained Outcome: Not Progressing   Problem: Coping: Goal: Level of anxiety will decrease Outcome: Not Progressing   Problem: Elimination: Goal: Will not experience complications related to bowel motility Outcome: Not Progressing Goal: Will not experience complications related to urinary retention Outcome: Not Progressing   Problem: Pain Managment: Goal: General experience of comfort will improve and/or be controlled Outcome: Not Progressing   Problem: Safety: Goal: Ability to remain free from injury will improve Outcome: Not Progressing   Problem: Skin Integrity: Goal: Risk for impaired skin integrity will decrease Outcome: Not Progressing   Problem: Activity: Goal: Ability to tolerate increased activity will improve Outcome: Not Progressing   Problem: Respiratory: Goal: Ability to maintain a clear airway and adequate ventilation will improve Outcome: Not Progressing   Problem: Role Relationship: Goal: Method of communication will improve Outcome: Not  Progressing   Problem: Education: Goal: Ability to describe self-care measures that may prevent or decrease complications (Diabetes Survival Skills Education) will improve Outcome: Not Progressing Goal: Individualized Educational Video(s) Outcome: Not Progressing   Problem: Coping: Goal: Ability to adjust to condition or change in health will improve Outcome: Not Progressing   Problem: Fluid Volume: Goal: Ability to maintain a balanced intake and output will improve Outcome: Not Progressing   Problem: Health Behavior/Discharge Planning: Goal: Ability to identify and utilize available resources and services will improve Outcome: Not Progressing Goal: Ability to manage health-related needs will improve Outcome: Not Progressing   Problem: Metabolic: Goal: Ability to maintain appropriate glucose levels will improve Outcome: Not Progressing   Problem: Nutritional: Goal: Maintenance of adequate nutrition will improve Outcome: Not Progressing Goal: Progress toward achieving an optimal weight will improve Outcome: Not Progressing   Problem: Skin Integrity: Goal: Risk for impaired skin integrity will decrease Outcome: Not Progressing   Problem: Tissue Perfusion: Goal: Adequacy of tissue perfusion will improve Outcome: Not Progressing

## 2023-11-18 NOTE — Progress Notes (Addendum)
NAME:  [name], MRN:  [id number], DOB:  08/26/1945, LOS: 8 ADMISSION DATE:  10/18/2023 CHIEF COMPLAINT:  SOB.    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  Presented from independent living facility after found to have noisy breathing and several days of progressive SOB found to be hypoxic in the 80's.  Pt states has been SOB for 3 days with new dry cough and poor PO intake related to dyspnea.  Denies fever, chills, chest pain, wt loss (although appears to have 19lb wt loss since 01/2023), hemoptysis, dysphagia or choking episodes. Does not use oxygen, nebs/ inhalers at baseline and denies OSA.    Treated in ER with O2, nebs, solumedrol.  Afebrile in ER, requiring 6L.  WBC 18.2, Hgb 9.4, sCr 1.48, ABG 7.414/ 29/ 153/ 18.6.  SARS/ flu/ RSV neg. CXR concerning for RLL caviatry lesion.  Empiric vancomycin  and zosyn started. CTA PE obtained, negative for PE, but extensive abnormality of RLL, RML and basilar aspect of RUE with mass like consolidation in RLL with multiple airway occlusions including endobronchial tumor/ debris and large 8.5 cm cavitary lesion of RLL with communication directly into lower lobe bronchi concerning for extensive pulmonary malignancy, pneumonia remains in differential.  Abnormal bronchovascular distribution within lingula extending into subpleural surface.  Also noted right renal abnormal contour suspicious for solid renal mass. Admitted to [facility] with Pulmonary consulting for further recomendations.   Pertinent  Medical History  former smoker (quit 2018), emphysema, CVA on plavix , HTN, HLD, GERD, anxiety, depression and GIB (AVMs-duodenal, cecal and right colon), OSA (denies), PVD, CKD3a, IDA, squamous cell carcinoma of tongue s/p excision  (2018)  Interim History / Subjective:  Pressure support trial failed as the patient became tachypneic.  Worsening white cell count today along with worsening creatinine. Significant secretions suctioned in the morning.  Significant Hospital Events: 10/1: admitted.  10/2: intubated.  10/25/23 :Palliative discussion with family, plan to transition to comfort care   Objective    Blood pressure (!) 133/57, pulse 86, temperature 97.8 F (36.6 C), temperature source Axillary, resp. rate (!) 32, height 5' (1.524 m), weight 66.8 kg, SpO2 93%.    Vent Mode: PRVC FiO2 (%):  [40 %-50 %] 40 % Set Rate:  [28 bmp] 28 bmp Vt Set:  [360 mL] 360 mL PEEP:  [5 cmH20] 5 cmH20 Plateau Pressure:  [17 cmH20-21 cmH20] 17 cmH20   Intake/Output Summary (Last 24 hours) at 11/17/2023 1243 Last data filed at 11/08/2023 1100 Gross per 24 hour  Intake 1573.23 ml  Output 295 ml  Net 1278.23 ml   Filed Weights   10/24/23 0701 10/25/23 0253 11/15/2023 0500  Weight: 66.6 kg 65.6 kg 66.8 kg    Examination: General:  elderly female, ill appearing , unresponsive, agitated when off sedation Lungs: wheezing and crackles on auscultation  Heart: regular rate rhythm, no murmur appreciated.  Abdomen: non tender, non distended. Normal BS.  Neuro: sedated   Assessment and Plan     Goals of care  Discussions with brother (HCP) regarding overall clinical status including multiorgan failure. Brother expressed transitioning to comfort goals tomorrow after all her friends and family who are able to see her can do so.  Will hold off on any invaisve procedures such as thoracentesis or bronch with biopsy of  mass.   Neuro History of CVA: -Aspirin  and statin for now. -Plavix  was changed to aspirin  on 10/19/23 while being transferred to the ICU if need for procedures. - Switch Prop to precedex  - Continue PRN fent    Pulm Right lower lobe cavitary lung mass with opacity in right middle lobe and left lingula-lung  abscess, will need to rule out malignancy:  Former smoker and prior tongue SCC: Hypoxic respiratory failure, mechanical ventilation: COPD exacerbation: - Lung opacity likely malignancy versus lung abscess. - Intubated.  Transition to pressure control mode today. - Will stop antibiotics today  - Solu-Medrol 40mg  daily for 5 days(today is the last dose), scheduled DuoNebs - Vent bundle    Cardiac/Vascular  A-fib: -Echo: EF normal with reduced RV function. - Low-dose metoprolol. - heparin gtt.    GI #GERD  Diet:  Tube feeds  GI PPX: PPI      ID Stopping Zyxox and zosyn today, will monitor off antibiotics, completed 7 day course,  concern for malignancy with post obstruvtive pneumonia  BAL  growing MSSA   Renal  Renal cyst: - Dedicated CT abdomen did not show any metastatic changes.  However did show change in density of the renal mass compared to before.  - Renal ultrasound performed shows simple renal cyst stable since last 2 years.     AKI on CKD stage IIIb: Anuria/oliguria: -No improvement with IV fluids.  Poor urine output. -Likely ATN.  UA done. - Nephrology consulted. - No indication for CRRT right now  - she is positive 6.5L , will stop fluids for now    Heme/Onc Switch to DVT ppx instead of heparin drip given report of some bloody stools overnight    Endo ISS: increase to moderate    Elevated FT4 - started on methimazole for FT4 of 1.32. Will dc.  -Will need TSH and free T4 as an outpatient.  If continued elevation of free T4 will need to be started on methimazole.   MSK/other  Severe Malnutrition Nutrition following       Labs   CBC: Recent Labs  Lab 10/22/23 0235 10/23/23 0255 10/23/23 1940 10/24/23 0533 10/24/23 1119 10/24/23 1237 10/24/23 1449 10/25/23 0351 10/25/23 0745  WBC 37.6* 39.4* 46.3* 62.9*  --   --   --  46.6*  --   NEUTROABS  --   --   --   --   --   --   --  29.9*  --   HGB 8.2* 7.6* 7.9* 8.2* 8.5* 8.2* 8.2* 7.0*  13.9  HCT 26.6* 24.6* 25.7* 27.2* 25.0* 24.0* 24.0* 23.2* 41.0  MCV 81.8 81.5 82.1 84.5  --   --   --  83.5  --   PLT 509* 415* 409* 499*  --   --   --  363  --     Basic Metabolic Panel: Recent Labs  Lab 10/20/23 0412 10/20/23 1133 10/21/23 0239 10/22/23 0235 10/22/23 1528 10/22/23 2117 10/23/23 0255 10/24/23 0533 10/24/23 1119 10/24/23 1237 10/24/23 1449 10/25/23 0351 10/25/23 0745  NA 135   < > 134* 135 133*  --  132* 137 137 138 138 139 140  K 3.9   < > 3.4* 5.1 5.6*   < > 4.3 5.6* 6.2* 6.0* 5.8* 5.9* 5.2*  CL 102  --  99 102 100  --  98 101  --   --   --  103  --   CO2 17*  --  19* 20* 18*  --  17* 22  --   --   --  20*  --   GLUCOSE 143*  --  162* 198* 201*  --  190* 154*  --   --   --  139*  --   BUN 40*  --  53* 73* 83*  --  87* 105*  --   --   --  125*  --   CREATININE 1.70*  --  2.29* 2.97* 3.20*  --  3.19* 3.41*  --   --   --  4.41*  --   CALCIUM  7.9*  --  7.7* 7.7* 7.5*  --  7.2* 7.8*  --   --   --  7.8*  --   MG 2.0  --  2.5* 2.4  --   --  2.3 2.7*  --   --   --  2.7*  --   PHOS 4.9*  --  5.0* 6.9*  --   --  5.9*  --   --   --   --   --   --    < > = values in this interval not displayed.   GFR: Estimated Creatinine Clearance: 8.8 mL/min (A) (by C-G formula based on SCr of 4.41 mg/dL (H)). Recent Labs  Lab 10/19/23 1832 10/20/23 0412 10/23/23 0255 10/23/23 1940 10/24/23 0533 10/25/23 0351  PROCALCITON 2.40  --   --   --   --   --   WBC  --    < > 39.4* 46.3* 62.9* 46.6*   < > = values in this interval not displayed.    Liver Function Tests: No results for input(s): AST, ALT, ALKPHOS, BILITOT, PROT, ALBUMIN in the last 168 hours.  No results for input(s): LIPASE, AMYLASE in the last 168 hours. No results for input(s): AMMONIA in the last 168 hours.  ABG    Component Value Date/Time   PHART 7.198 (LL) 10/25/2023 0745   PCO2ART 68.2 (HH) 10/25/2023 0745   PO2ART 120 (H) 10/25/2023 0745   HCO3 24.3 10/28/2023 0926   TCO2 29  10/25/2023 0745   ACIDBASEDEF 4.1 (H) 11/01/2023 0926   O2SAT 90.9 11/02/2023 0926      Past Medical History:  She,  has a past medical history of Asthma, Cancer (HCC), Depression, Hypertension, Peripheral vascular disease, Pneumonia, Sleep apnea, and Stroke (HCC).   Surgical History:   Past Surgical History:  Procedure Laterality Date   BIOPSY  08/16/2021   Procedure: BIOPSY;  Surgeon: Elicia Claw, MD;  Location: [facility] ENDOSCOPY;  Service: Gastroenterology;;   COLONOSCOPY WITH PROPOFOL  N/A 08/16/2021   Procedure: COLONOSCOPY WITH PROPOFOL ;  Surgeon: Elicia Claw, MD;  Location: [facility] ENDOSCOPY;  Service: Gastroenterology;  Laterality: N/A;   DIRECT LARYNGOSCOPY Left 06/29/2016   Procedure: WIDE EXCISION LEFT TONGUE CANCER DIRECT LARYNGOSCOPY;  Surgeon: Arlana Arnt, MD;  Location: [facility] OR;  Service: ENT;  Laterality: Left;   ESOPHAGOGASTRODUODENOSCOPY (EGD) WITH PROPOFOL  Left 07/12/2018   Procedure: ESOPHAGOGASTRODUODENOSCOPY (EGD) WITH PROPOFOL ;  Surgeon: Dianna Specking, MD;  Location: [facility] ENDOSCOPY;  Service: Endoscopy;  Laterality: Left;   ESOPHAGOGASTRODUODENOSCOPY (EGD) WITH PROPOFOL  N/A 08/16/2021   Procedure: ESOPHAGOGASTRODUODENOSCOPY (EGD) WITH PROPOFOL ;  Surgeon: Elicia Claw, MD;  Location: [facility] ENDOSCOPY;  Service: Gastroenterology;  Laterality: N/A;   ESOPHAGOSCOPY N/A 06/29/2016   Procedure: ESOPHAGOSCOPY/BRONCHOSCOPY;  Surgeon: Arlana Arnt, MD;  Location[facility] OR;  Service: ENT;  Laterality: N/A;   EYE SURGERY Bilateral 2016   HEMOSTASIS CLIP PLACEMENT  08/16/2021   Procedure: HEMOSTASIS CLIP PLACEMENT;  Surgeon:  Elicia Claw, MD;  Location: [facility] ENDOSCOPY;  Service: Gastroenterology;;   HOT HEMOSTASIS N/A 07/12/2018   Procedure: HOT HEMOSTASIS (ARGON PLASMA COAGULATION/BICAP);  Surgeon: Dianna Specking, MD;  [facility]COPY;  Service: Endoscopy;  Laterality: N/A;   HOT HEMOSTASIS N/A 08/16/2021   Procedure: HOT HEMOSTASIS (ARGON PLASMA COAGULATION/BICAP);   Surgeon: Elicia Claw, MD;  Location: [facility] ENDOSCOPY;  Service: Gastroenterology;  Laterality: N/A;   KNEE SURGERY Right 2014   LOOP RECORDER INSERTION N/A 03/21/2016   Procedure: Loop Recorder Insertion;  Surgeon: Will Gladis Norton, MD;  Location: [facility] INVASIVE CV LAB;  Service: Cardiovascular;  Laterality: N/A;   PARS PLANA VITRECTOMY Right 02/28/2014   Procedure: PARS PLANA VITRECTOMY 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 WITH TAP AND ANTIBIOTIC INJECTION;  Surgeon: Arley DELENA Ruder, MD;  Location: [facility] OR;  Service: Ophthalmology;  Laterality: Right;   POLYPECTOMY  08/16/2021   Procedure: POLYPECTOMY;  Surgeon: Elicia Claw, MD;  Location[facility] ENDOSCOPY;  Service: Gastroenterology;;   SCLEROTHERAPY  08/16/2021   Procedure: MATIAS;  Surgeon: Elicia Claw, MD;  Location: [facility] ENDOSCOPY;  Service: Gastroenterology;;   TEE WITHOUT CARDIOVERSION N/A 03/21/2016   Procedure: TRANSESOPHAGEAL ECHOCARDIOGRAM (TEE);  Surgeon: Annabella Scarce, MD;  Location: [facility] ENDOSCOPY;  Service: Cardiovascular;  Laterality: N/A;     Social History:   reports that she quit smoking about 7 years ago. She has never used smokeless tobacco. She reports that she does not drink alcohol  and does not use drugs.   Family History:  Her family history includes Cancer in her mother; Heart disease in her brother; Stroke in her father.   Allergies Allergies  Allergen Reactions   Codeine Itching and Other (See Comments)    Caused mouth to break out in hives   Neosporin [Neomycin-Bacitracin Zn-Polymyx] Itching and Swelling    Swelling at application site   Percocet [Oxycodone -Acetaminophen ] Other (See Comments)    B-blockers, triggers depression   Tussionex Pennkinetic Er [Hydrocod Poli-Chlorphe Poli Er] Itching     Home Medications  Prior to Admission medications   Medication Sig Start Date End Date Taking? Authorizing Provider  amLODipine  (NORVASC ) 10 MG tablet Take 1 tablet (10 mg total) by mouth daily. 05/09/18  Yes  Ricky Fines, MD  atorvastatin  (LIPITOR) 10 MG tablet Take 10 mg by mouth daily.  03/16/16  Yes [provider]  clopidogrel  (PLAVIX ) 75 MG tablet Take 1 tablet (75 mg total) by mouth daily. 07/17/18  Yes Gherghe, Costin M, MD  escitalopram  (LEXAPRO ) 20 MG tablet Take 20 mg by mouth daily.  07/10/14  Yes [provider]  ferrous sulfate  325 (65 FE) MG tablet Take 1 tablet (325 mg total) by mouth daily with breakfast. Patient taking differently: Take 325 mg by mouth every other day. Monday, Wednesday, Friday 07/13/18  Yes Gherghe, Costin M, MD  losartan  (COZAAR ) 25 MG tablet Take 25 mg by mouth daily. 09/29/23  Yes [provider]  pantoprazole  (PROTONIX ) 40 MG tablet Take 1 tablet (40 mg total) by mouth daily. 07/13/18  Yes Gherghe, Costin M, MD  vitamin E 180 MG (400 UNITS) capsule Take 400 Units by mouth daily.   Yes [provider]      The patient is critically ill due to acute hypoxic respiratroy failure requiring mechanical ventilation.  Critical care was necessary to treat or prevent imminent or life-threatening deterioration. Critical care time was spent by me on the following activities: development of a treatment plan with the patient and/or surrogate as well as nursing, discussions with consultants, evaluation  of the patient's response to treatment, examination of the patient, obtaining a history from the patient or surrogate, ordering and performing treatments and interventions, ordering and review of laboratory studies, ordering and review of radiographic studies, review of telemetry data including pulse oximetry, re-evaluation of patient's condition and participation in multidisciplinary rounds.   I personally spent 32 minutes providing critical care not including any separately billable procedures.   Zola LOISE Herter, MD [facility] 11/06/2023 12:43 PM

## 2023-11-18 NOTE — Progress Notes (Signed)
 Discussed with brother Norleen and his wife worsening hyperkalemia and acidosis due to renal failure. They would like to focus on her comfort rather than doing any more invasive testing or monitoring. Will transition goals to comfort and provide pain and anxiety medication after which we will terminally extubate. Family is in agreement and would like a phone call at the time she passes away.    Zola Herter, MD [facility] Office: [phone]   See Amion for personal pager PCCM on call pager [phone] until 7pm. Please call Elink 7p-7a. [phone]

## 2023-11-18 NOTE — Progress Notes (Signed)
Body transported to morgue.

## 2023-11-18 NOTE — Progress Notes (Signed)
 DEATH SUMMARY   Patient Details  Name: [name] MRN: [id number] DOB: [birth year] ERE:Mlddn, Norleen, MD  Admission/Discharge Information   Admit Date:  17-Nov-2023  Date of Death:    Time of Death:    Length of Stay: 8   Principle Cause of death: Acute hypoxic respiratory failure   Hospital Diagnoses: Principal Problem:   Acute respiratory failure with hypoxia (HCC) Active Problems:   History of CVA (cerebrovascular accident)   Essential hypertension   History of tobacco abuse   Right lower lobe lung mass   SIRS (systemic inflammatory response syndrome) (HCC)   Renal lesion   Diarrhea   Microcytic anemia   Chronic kidney disease, stage III (moderate) (HCC)   History of squamous cell carcinoma   GERD (gastroesophageal reflux disease)   Protein-calorie malnutrition, severe   Hospital Course: 78 yoF with hx of former smoker (quit 2020), emphysema, CVA on plavix , HTN, HLD, GERD, anxiety, depression and GIB (AVMs-duodenal, cecal and right colon), OSA (denies), PVD, CKD3a, IDA, 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  Presented from independent living facility after found to have noisy breathing and several days of progressive SOB found to be hypoxic in the 80's.  Pt states has been SOB for 3 days with new dry cough and poor PO intake related to dyspnea.  Denies fever, chills, chest pain, wt loss (although appears to have 19lb wt loss since 01/2023), hemoptysis, dysphagia or choking episodes. Does not use oxygen, nebs/ inhalers at baseline and denies OSA.    Treated in ER with O2, nebs, solumedrol.  Afebrile in ER, requiring 6L.  WBC 18.2, Hgb 9.4, sCr 1.48, ABG 7.414/ 29/ 153/ 18.6.  SARS/ flu/ RSV neg. CXR concerning for RLL caviatry lesion.  Empiric vancomycin  and zosyn started. CTA PE obtained, negative for PE, but extensive abnormality of RLL, RML and basilar aspect of RUE with mass like consolidation in RLL with multiple airway occlusions including  endobronchial tumor/ debris and large 8.5 cm cavitary lesion of RLL with communication directly into lower lobe bronchi concerning for extensive pulmonary malignancy, pneumonia remains in differential.  Abnormal bronchovascular distribution within lingula extending into subpleural surface.  Also noted right renal abnormal contour suspicious for solid renal mass. Admitted to [facility] with Pulmonary consulting for further recomendations.   After admission to the ICU had worsening vent requiriements despite antibiotics to treat a pneumonia. Developed a severe AKI with oliguria and worsening acidosis and hyperkalemia. Goals of care dicussed with family and eventually transitioned to comfort measures only. She passed away peacefully at 15:59            The results of significant diagnostics from this hospitalization (including imaging, microbiology, ancillary and laboratory) are listed below for reference.   Significant Diagnostic Studies: DG CHEST PORT 1 VIEW Result Date: 10/24/2023 CLINICAL DATA:  Shortness of breath.  Hypoxia. EXAM: PORTABLE CHEST 1 VIEW COMPARISON:  10/23/2023 FINDINGS: Endotracheal tube in satisfactory position. Nasogastric tube extending into the stomach with its tip and side hole not included. Normal-sized heart. Increased right basilar airspace opacity with consolidation and an interval small right pleural effusion. Mild left basilar atelectasis. Diffusely prominent interstitial markings without significant change. Normal pulmonary vascularity. Diffuse osteopenia. IMPRESSION: 1. Increased right basilar pneumonia with an interval small right pleural effusion. 2. Mild left basilar atelectasis. 3. Stable diffuse chronic interstitial lung disease. Electronically Signed   By: Elspeth Bathe M.D.   On: 10/24/2023 15:05   DG CHEST PORT 1 VIEW Result Date: 10/23/2023  CLINICAL DATA:  Fever and shortness of breath. EXAM: PORTABLE CHEST 1 VIEW COMPARISON:  10/19/2023 FINDINGS: Endotracheal  tube tip 5.5 cm above the carina. Nasogastric tube extending into the stomach with the side hole in the mid stomach and tip not included. The cardiac silhouette remains borderline enlarged. Prominent patchy density at the right lung base, mildly improved. Tortuous and partially calcified thoracic aorta. No definite residual pleural fluid seen on either side. Unremarkable bones. IMPRESSION: 1. Mildly improved right basilar pneumonia. 2. No definite residual pleural fluid seen on either side. 3. Borderline cardiomegaly. Electronically Signed   By: Elspeth Bathe M.D.   On: 10/23/2023 16:14   US  RENAL Result Date: 10/21/2023 CLINICAL DATA:  Kidney mass. EXAM: RENAL / URINARY TRACT ULTRASOUND COMPLETE COMPARISON:  CT scan from 1 day prior. FINDINGS: Right Kidney: Renal measurements: 11.3 x 6.0 x 5.0 cm = volume: 178 mL. 3.0 x 2.7 x 2.8 cm simple cyst is identified in interpolar right kidney. This was present on a CT scan from 08/06/2021 measuring 2.9 x 2.7 x 2.6 cm at that time. Left Kidney: Renal measurements: 9.8 x 5.5 x 4.7 cm = volume: 125 mL. 2.7 x 2.7 x 2.7 cm simple cyst identified in the lower pole region, slightly progressive since previous CT of 2023. Bladder: Decompressed by Foley catheter. Other: Probable punctate nonobstructing stones right kidney. IMPRESSION: 1. Right renal cyst of concern on yesterday's CT scan has simple imaging features by ultrasound today in shows no substantial progression in the 2 year interval since the prior study, most consistent with benign etiology. 2. Probable punctate nonobstructing stones right kidney. Electronically Signed   By: Camellia Candle M.D.   On: 10/21/2023 09:21   CT ABDOMEN PELVIS WO CONTRAST Result Date: 10/20/2023 CLINICAL DATA:  Indeterminate renal mass or cyst. EXAM: CT ABDOMEN AND PELVIS WITHOUT CONTRAST TECHNIQUE: Multidetector CT imaging of the abdomen and pelvis was performed following the standard protocol without IV contrast. RADIATION DOSE REDUCTION:  This exam was performed according to the departmental dose-optimization program which includes automated exposure control, adjustment of the mA and/or kV according to patient size and/or use of iterative reconstruction technique. COMPARISON:  August 06, 2021.  October 18, 2023. FINDINGS: Lower chest: Large airspace opacity is noted in right lower lobe with air bronchograms and multiple areas of cavitation, most consistent with cavitating pneumonia. Mild left posterior basilar atelectasis or infiltrate is noted. Hepatobiliary: No biliary dilatation is noted. Liver is unremarkable on these unenhanced images. Moderate gallbladder distention is noted with possible sludge present. Pancreas: Unremarkable. No pancreatic ductal dilatation or surrounding inflammatory changes. Spleen: Normal in size without focal abnormality. Adrenals/Urinary Tract: Adrenal glands appear normal. No hydronephrosis or renal obstruction is noted. 3.5 x 2.4 cm lobulated isodense abnormality is seen involving upper pole of right kidney. This appear to be a simple cyst on prior CT scan of 2023, but given the density change, further evaluation with renal ultrasound or MRI is recommended to rule out neoplasm. Mild urinary bladder distention is noted with probable residual contrast from recent CT scan. Stomach/Bowel: Nasogastric tube tip is seen in distal stomach. There is no evidence of bowel obstruction or inflammation. Sigmoid diverticulosis without inflammation. The appendix is unremarkable. Vascular/Lymphatic: Aortic atherosclerosis. No enlarged abdominal or pelvic lymph nodes. Reproductive: Uterus and bilateral adnexa are unremarkable. Other: No abdominal wall hernia or abnormality. No abdominopelvic ascites. Musculoskeletal: No acute or significant osseous findings. IMPRESSION: 1. Large airspace opacity is noted in right lower lobe with air bronchograms and multiple  areas of cavitation, most consistent with cavitating pneumonia. 2. Mild left  posterior basilar atelectasis or infiltrate is noted. 3. Moderate gallbladder distention is noted with possible sludge present. 4. 3.5 x 2.4 cm lobulated isodense abnormality is seen involving upper pole of right kidney. This corresponds in location to a simple cyst on prior CT scan of 2023, but given the density change, further evaluation with renal ultrasound or MRI is recommended to rule out neoplasm. 5. Sigmoid diverticulosis without inflammation. 6. Aortic atherosclerosis. Aortic Atherosclerosis (ICD10-I70.0). Electronically Signed   By: Lynwood Landy Raddle M.D.   On: 10/20/2023 13:59   DG CHEST PORT 1 VIEW Result Date: 10/19/2023 CLINICAL DATA:  [id number] Respiratory difficulty [id number] Endotracheally intubated [id number] EXAM: PORTABLE CHEST 1 VIEW COMPARISON:  X-ray abdomen 09/21/2006, chest x-ray 10/19/2023 3:15 p.m., CT angio chest 10/18/2023 FINDINGS: Enteric tube courses below the hemidiaphragm with tip and side port overlying the expected region the gastric lumen. Endotracheal tube with tip terminating 3.5 cm above the carina. The heart and mediastinal contours are unchanged. Atherosclerotic plaque. Right mid lower lung zone airspace opacity. Retrocardiac airspace opacity on the left. Chronic coarsened interstitial markings with no overt pulmonary edema. At least small right and trace left pleural effusion. No pneumothorax. Nonobstructive bowel gas pattern. No acute osseous abnormality. Severe atherosclerotic plaque of the abdominal aorta and its branches. IMPRESSION: 1. Bilateral lower lung zone patchy airspace opacities with bilateral, right greater than left, trace to small volume pleural effusions. 2. Nonobstructive bowel gas pattern. 3.  Aortic Atherosclerosis (ICD10-I70.0). Electronically Signed   By: Morgane  Naveau M.D.   On: 10/19/2023 18:12   DG Abd 1 View Result Date: 10/19/2023 CLINICAL DATA:  [id number] Respiratory difficulty [id number] Endotracheally intubated [id number] EXAM: PORTABLE  CHEST 1 VIEW COMPARISON:  X-ray abdomen 09/21/2006, chest x-ray 10/19/2023 3:15 p.m., CT angio chest 10/18/2023 FINDINGS: Enteric tube courses below the hemidiaphragm with tip and side port overlying the expected region the gastric lumen. Endotracheal tube with tip terminating 3.5 cm above the carina. The heart and mediastinal contours are unchanged. Atherosclerotic plaque. Right mid lower lung zone airspace opacity. Retrocardiac airspace opacity on the left. Chronic coarsened interstitial markings with no overt pulmonary edema. At least small right and trace left pleural effusion. No pneumothorax. Nonobstructive bowel gas pattern. No acute osseous abnormality. Severe atherosclerotic plaque of the abdominal aorta and its branches. IMPRESSION: 1. Bilateral lower lung zone patchy airspace opacities with bilateral, right greater than left, trace to small volume pleural effusions. 2. Nonobstructive bowel gas pattern. 3.  Aortic Atherosclerosis (ICD10-I70.0). Electronically Signed   By: Morgane  Naveau M.D.   On: 10/19/2023 18:12   ECHOCARDIOGRAM COMPLETE Result Date: 10/19/2023    ECHOCARDIOGRAM REPORT   Patient Name:   ALIXIS HARMON Date of Exam: 10/19/2023 Medical Rec #:  [id number]           Height:       60.0 in Accession #:    [id number]          Weight:       125.0 lb Date of Birth:  11/21/1945           BSA:          1.529 m Patient Age:    78 years            BP:           131/63 mmHg Patient Gender: F  HR:           84 bpm. Exam Location:  Inpatient Procedure: 2D Echo (Both Spectral and Color Flow Doppler were utilized during            procedure). Indications:    Afib  History:        Patient has prior history of Echocardiogram examinations.                 Arrythmias:Atrial Fibrillation.  Sonographer:    Norleen Amour Referring Phys: [id number] ARSHAD N KAKRAKANDY IMPRESSIONS  1. Left ventricular ejection fraction, by estimation, is 55 to 60%. Left ventricular ejection fraction by 2D MOD  biplane is 59.2 %. The left ventricle has normal function. The left ventricle has no regional wall motion abnormalities. Left ventricular diastolic parameters are indeterminate.  2. Right ventricular systolic function is mildly reduced. The right ventricular size is normal. Tricuspid regurgitation signal is inadequate for assessing PA pressure.  3. The mitral valve is grossly normal. Trivial mitral valve regurgitation.  4. The aortic valve is tricuspid. Aortic valve regurgitation is not visualized. Aortic valve sclerosis is present, with no evidence of aortic valve stenosis.  5. The inferior vena cava is normal in size with <50% respiratory variability, suggesting right atrial pressure of 8 mmHg. Comparison(s): Changes from prior study are noted. 05/05/2018: LVEF 50-55%. FINDINGS  Left Ventricle: Left ventricular ejection fraction, by estimation, is 55 to 60%. Left ventricular ejection fraction by 2D MOD biplane is 59.2 %. The left ventricle has normal function. The left ventricle has no regional wall motion abnormalities. The left ventricular internal cavity size was normal in size. There is no left ventricular hypertrophy. Left ventricular diastolic function could not be evaluated due to indeterminate diastolic function. Left ventricular diastolic parameters are indeterminate. Right Ventricle: The right ventricular size is normal. No increase in right ventricular wall thickness. Right ventricular systolic function is mildly reduced. Tricuspid regurgitation signal is inadequate for assessing PA pressure. Left Atrium: Left atrial size was normal in size. Right Atrium: Right atrial size was normal in size. Pericardium: There is no evidence of pericardial effusion. Mitral Valve: The mitral valve is grossly normal. Trivial mitral valve regurgitation. Tricuspid Valve: The tricuspid valve is not well visualized. Tricuspid valve regurgitation is not demonstrated. Aortic Valve: The aortic valve is tricuspid. Aortic valve  regurgitation is not visualized. Aortic valve sclerosis is present, with no evidence of aortic valve stenosis. Pulmonic Valve: The pulmonic valve was not well visualized. Pulmonic valve regurgitation is not visualized. Aorta: The aortic root and ascending aorta are structurally normal, with no evidence of dilitation. Venous: The inferior vena cava is normal in size with less than 50% respiratory variability, suggesting right atrial pressure of 8 mmHg. IAS/Shunts: The interatrial septum was not well visualized.  LEFT VENTRICLE PLAX 2D                        Biplane EF (MOD) LVIDd:         5.10 cm         LV Biplane EF:   Left LVIDs:         3.20 cm                          ventricular LV PW:         0.80 cm  ejection LV IVS:        0.80 cm                          fraction by LVOT diam:     1.90 cm                          2D MOD LV SV:         58                               biplane is LV SV Index:   38                               59.2 %. LVOT Area:     2.84 cm                                Diastology                                LV e' medial:    11.50 cm/s LV Volumes (MOD)               LV E/e' medial:  13.7 LV vol d, MOD    90.3 ml       LV e' lateral:   12.90 cm/s A2C:                           LV E/e' lateral: 12.2 LV vol d, MOD    76.7 ml A4C: LV vol s, MOD    31.1 ml A2C: LV vol s, MOD    38.3 ml A4C: LV SV MOD A2C:   59.2 ml LV SV MOD A4C:   76.7 ml LV SV MOD BP:    53.6 ml RIGHT VENTRICLE RV S prime:     7.72 cm/s TAPSE (M-mode): 1.4 cm LEFT ATRIUM           Index LA diam:      3.20 cm 2.09 cm/m LA Vol (A2C): 31.5 ml 20.61 ml/m LA Vol (A4C): 41.4 ml 27.08 ml/m  AORTIC VALVE             PULMONIC VALVE LVOT Vmax:   101.00 cm/s PV Vmax:       0.59 m/s LVOT Vmean:  57.200 cm/s PV Peak grad:  1.4 mmHg LVOT VTI:    0.205 m  AORTA Ao Root diam: 2.60 cm Ao Asc diam:  2.40 cm MITRAL VALVE MV Area (PHT): 4.54 cm     SHUNTS MV Decel Time: 167 msec     Systemic VTI:  0.20 m MV E  velocity: 157.00 cm/s  Systemic Diam: 1.90 cm MV A velocity: 141.00 cm/s MV E/A ratio:  1.11 Vinie Maxcy MD Electronically signed by Vinie Maxcy MD Signature Date/Time: 10/19/2023/3:41:41 PM    Final    DG Chest Port 1 View Result Date: 10/19/2023 CLINICAL DATA:  Hypoxia. EXAM: PORTABLE CHEST 1 VIEW COMPARISON:  October 18, 2023. FINDINGS: Stable cardiomediastinal silhouette. Mild pulmonary edema is noted. Continued presence of cavitary lesion in right lower lobe concerning for cavitary pneumonia or malignancy. Right basilar opacity is noted concerning  for atelectasis, infiltrate or effusion. IMPRESSION: Mild pulmonary edema. Continued presence of cavitary lesion in right lower lobe concerning for cavitary pneumonia or malignancy. Right basilar opacity is noted concerning for atelectasis, infiltrate or effusion. Electronically Signed   By: Lynwood Landy Raddle M.D.   On: 10/19/2023 15:33   DG CHEST PORT 1 VIEW Result Date: 10/18/2023 CLINICAL DATA:  Shortness of breath EXAM: PORTABLE CHEST 1 VIEW COMPARISON:  10/18/2023 FINDINGS: Single frontal view of the chest demonstrates a stable cardiac silhouette. Large area of cavitating consolidation within the right lower lobe as seen on prior study is again identified. Left chest is clear. No large effusion or pneumothorax. No acute bony abnormalities. IMPRESSION: 1. Large area of cavitating consolidation within the right lower lobe, which may reflect infection or neoplasm. No significant change since earlier exam. Electronically Signed   By: Ozell Daring M.D.   On: 10/18/2023 20:01   CT Angio Chest PE W and/or Wo Contrast Result Date: 10/18/2023 CLINICAL DATA:  Shortness of breath, hypoxia, cough and tachypnea. History squamous carcinoma of the tongue. Abnormal chest x-ray today demonstrating cavitary mass of the right lower lung. EXAM: CT ANGIOGRAPHY CHEST WITH CONTRAST TECHNIQUE: Multidetector CT imaging of the chest was performed using the standard protocol  during bolus administration of intravenous contrast. Multiplanar CT image reconstructions and MIPs were obtained to evaluate the vascular anatomy. RADIATION DOSE REDUCTION: This exam was performed according to the departmental dose-optimization program which includes automated exposure control, adjustment of the mA and/or kV according to patient size and/or use of iterative reconstruction technique. CONTRAST:  60mL OMNIPAQUE  IOHEXOL  350 MG/ML SOLN COMPARISON:  Chest x-ray today.  Prior CT chest 03/18/2016 FINDINGS: Cardiovascular: The pulmonary arteries are adequately opacified. There is no evidence of pulmonary embolism. Central pulmonary arteries are of normal caliber. The thoracic aorta is normal in caliber. No significant aortic atherosclerosis. The heart size is normal. No pericardial fluid identified. No visualized calcified coronary artery plaque. Mediastinum/Nodes: Confluent soft tissue in the subcarinal space likely representing lymphadenopathy or contiguous tumor from the hilum or lung. Abnormal soft tissue throughout the right hilum also represents either contiguous tumor or lymph node enlargement. Lungs/Pleura: Extensive pulmonary abnormality involving the entire right lower lobe and extending into the right middle lobe and basilar aspect of the right upper lobe across the major fissure consists of mass-like consolidation of the basilar aspect of the right lower lobe with multiple airway occlusions including endobronchial tumor/debris extending into the bronchus intermedius and large cavitary component in the mid to upper aspect of the right lower lobe measuring up to approximately 8.5 cm in diameter and demonstrating thick irregular internal walls and internal cavitation communicating directly with lower lobe bronchi. There also is abnormal soft tissue in a bronchovascular distribution within the lingula extending to the subpleural surface. While pneumonia is in the differential, conglomeration of  findings including the abnormal soft tissue in the right hilum and subcarinal space is most concerning for extensive pulmonary malignancy. Recommend eventual correlation bronchoscopy. No associated pleural fluid or pneumothorax. Upper Abdomen: On the relative arterial phase, the liver is not optimally evaluated for lesions but no discrete liver lesions are identified. There is lobulated contour of the superior and lateral aspect of the right kidney which is not cystic and measures up to approximately 3.8 cm in estimated diameter. This is suspicious for a potential solid right renal mass. Musculoskeletal: No chest wall abnormality. No acute or significant osseous findings. Review of the MIP images confirms the above findings. IMPRESSION:  1. No evidence of pulmonary embolism. 2. Extensive pulmonary abnormality involving the entire right lower lobe and extending into the right middle lobe and basilar aspect of the right upper lobe across the major fissure. This consists of mass-like consolidation of the basilar aspect of the right lower lobe with multiple airway occlusions including endobronchial tumor/debris extending into the bronchus intermedius and large cavitary component in the mid to upper aspect of the right lower lobe measuring up to approximately 8.5 cm in diameter and demonstrating thick irregular internal walls and internal cavitation communicating directly with lower lobe bronchi. There also is abnormal soft tissue in a bronchovascular distribution within the lingula extending to the subpleural surface. While pneumonia is in the differential, conglomeration of findings including the abnormal soft tissue in the right hilum and subcarinal space is most concerning for extensive pulmonary malignancy. Recommend eventual correlation bronchoscopy. 3. Lobulated contour of the superior and lateral aspect of the right kidney which is not cystic and measures up to approximately 3.8 cm in estimated diameter. This is  suspicious for a potential solid right renal mass. Recommend further evaluation with either renal mass protocol CT or MRI of the abdomen with and without contrast when the patient is able to tolerate further examination and/or additional contrast load. Electronically Signed   By: Marcey Moan M.D.   On: 10/18/2023 13:19   DG Chest Port 1 View if patient is in a treatment room. Result Date: 10/18/2023 CLINICAL DATA:  Shortness of breath.  Possible sepsis. EXAM: PORTABLE CHEST 1 VIEW COMPARISON:  March 18, 2016. FINDINGS: The heart size and mediastinal contours are within normal limits. Large cavitary lesion is seen in right lower lobe concerning for cavitary pneumonia or neoplasm. Right basilar pneumonia or atelectasis is noted with possible effusion. Left lung is unremarkable. The visualized skeletal structures are unremarkable. IMPRESSION: Large cavitary lesion seen in right lower lobe concerning for cavitary pneumonia or neoplasm. CT scan of the chest with intravenous contrast is recommended for further evaluation. Electronically Signed   By: Lynwood Landy Raddle M.D.   On: 10/18/2023 10:20    Microbiology: Recent Results (from the past 240 hours)  Culture, blood (Routine x 2)     Status: None   Collection Time: 10/18/23  9:46 AM   Specimen: BLOOD RIGHT ARM  Result Value Ref Range Status   Specimen Description BLOOD RIGHT ARM  Final   Special Requests   Final    BOTTLES DRAWN AEROBIC AND ANAEROBIC Blood Culture adequate volume   Culture   Final    NO GROWTH 5 DAYS Performed at [facility], [address], [place], [place] [address]    Report Status 10/23/2023 FINAL  Final  Culture, blood (Routine x 2)     Status: None   Collection Time: 10/18/23  9:51 AM   Specimen: BLOOD RIGHT FOREARM  Result Value Ref Range Status   Specimen Description BLOOD RIGHT FOREARM  Final   Special Requests   Final    BOTTLES DRAWN AEROBIC AND ANAEROBIC Blood Culture adequate volume   Culture   Final    NO  GROWTH 5 DAYS Performed at [facility], [address], [place], [place] [address]    Report Status 10/23/2023 FINAL  Final  Resp panel by RT-PCR (RSV, Flu A&B, Covid) Anterior Nasal Swab     Status: None   Collection Time: 10/18/23 10:02 AM   Specimen: Anterior Nasal Swab  Result Value Ref Range Status   SARS Coronavirus  2 by RT PCR NEGATIVE NEGATIVE Final   Influenza A by PCR NEGATIVE NEGATIVE Final   Influenza B by PCR NEGATIVE NEGATIVE Final    Comment: (NOTE) The Xpert Xpress SARS-CoV-2/FLU/RSV plus assay is intended as an aid in the diagnosis of influenza from Nasopharyngeal swab specimens and should not be used as a sole basis for treatment. Nasal washings and aspirates are unacceptable for Xpert Xpress SARS-CoV-2/FLU/RSV testing.  Fact Sheet for Patients: BloggerCourse.com  Fact Sheet for Healthcare Providers: SeriousBroker.it  This test is not yet approved or cleared by the [place]  FDA and has been authorized for detection and/or diagnosis of SARS-CoV-2 by FDA under an Emergency Use Authorization (EUA). This EUA will remain in effect (meaning this test can be used) for the duration of the COVID-19 declaration under Section 564(b)(1) of the Act, 21 U.S.C. section 360bbb-3(b)(1), unless the authorization is terminated or revoked.     Resp Syncytial Virus by PCR NEGATIVE NEGATIVE Final    Comment: (NOTE) Fact Sheet for Patients: BloggerCourse.com  Fact Sheet for Healthcare Providers: SeriousBroker.it  This test is not yet approved or cleared by the [place]  FDA and has been authorized for detection and/or diagnosis of SARS-CoV-2 by FDA under an Emergency Use Authorization (EUA). This EUA will remain in effect (meaning this test can be used) for the duration of the COVID-19 declaration under Section 564(b)(1) of the Act, 21 U.S.C. section  360bbb-3(b)(1), unless the authorization is terminated or revoked.  Performed at [facility], [address] [place], [place] [address]   MRSA Next Gen by PCR, Nasal     Status: None   Collection Time: 10/18/23  2:41 PM   Specimen: PATH Cytology Bronchial lavage; Nasal Swab  Result Value Ref Range Status   MRSA by PCR Next Gen NOT DETECTED NOT DETECTED Final    Comment: (NOTE) The GeneXpert MRSA Assay (FDA approved for NASAL specimens only), is one component of a comprehensive MRSA colonization surveillance program. It is not intended to diagnose MRSA infection nor to guide or monitor treatment for MRSA infections. Test performance is not FDA approved in patients less than 98 years old. Performed at [facility], [address], [place], [place] [address]   Culture, Respiratory w Gram Stain     Status: None   Collection Time: 10/19/23  5:10 PM   Specimen: Bronchoalveolar Lavage; Respiratory  Result Value Ref Range Status   Specimen Description BRONCHIAL ALVEOLAR LAVAGE  Final   Special Requests NONE  Final   Gram Stain   Final    FEW WBC PRESENT, PREDOMINANTLY PMN NO ORGANISMS SEEN Performed at [facility], [address], [place], [place] [address]    Culture FEW STAPHYLOCOCCUS AUREUS  Final   Report Status 10/22/2023 FINAL  Final   Organism ID, Bacteria STAPHYLOCOCCUS AUREUS  Final      Susceptibility   Staphylococcus aureus - MIC*    CIPROFLOXACIN <=0.5 SENSITIVE Sensitive     ERYTHROMYCIN <=0.25 SENSITIVE Sensitive     GENTAMICIN <=0.5 SENSITIVE Sensitive     OXACILLIN <=0.25 SENSITIVE Sensitive     TETRACYCLINE <=1 SENSITIVE Sensitive     VANCOMYCIN  1 SENSITIVE Sensitive     TRIMETH/SULFA <=10 SENSITIVE Sensitive     CLINDAMYCIN <=0.25 SENSITIVE Sensitive     RIFAMPIN <=0.5 SENSITIVE Sensitive     Inducible Clindamycin NEGATIVE Sensitive     LINEZOLID 2 SENSITIVE Sensitive     * FEW STAPHYLOCOCCUS AUREUS  Acid Fast Smear (AFB)  Status: None    Collection Time: 10/19/23  5:17 PM   Specimen: Bronchial Alveolar Lavage; Respiratory  Result Value Ref Range Status   AFB Specimen Processing Concentration  Final   Acid Fast Smear Negative  Final    Comment: (NOTE) Performed At: [facility] [address] [place], [place] [id number] Jennette Shorter MD Ey:[id number]    Source (AFB) BRONCHIAL ALVEOLAR LAVAGE  Final    Comment: Performed at [facility], [address], [place], [place] [address]  Fungus Culture With Stain     Status: None (Preliminary result)   Collection Time: 10/19/23  5:17 PM   Specimen: Bronchial Alveolar Lavage; Respiratory  Result Value Ref Range Status   Fungus Stain Final report  Final    Comment: (NOTE) Performed At: [facility] [address] [place], [place] [id number] Jennette Shorter MD Ey:[id number]    Fungus (Mycology) Culture PENDING  Incomplete   Fungal Source BRONCHIAL ALVEOLAR LAVAGE  Final    Comment: Performed at [facility], [address] [place], [place] [address]  Fungus Culture Result     Status: None   Collection Time: 10/19/23  5:17 PM  Result Value Ref Range Status   Result 1 Comment  Final    Comment: (NOTE) KOH/Calcofluor preparation:  no fungus observed. Performed At: [facility] [address] [place], [place] [id number] Jennette Shorter MD Ey:[id number]   Culture, Respiratory w Gram Stain     Status: None   Collection Time: 10/22/23  8:51 AM   Specimen: Tracheal Aspirate; Respiratory  Result Value Ref Range Status   Specimen Description TRACHEAL ASPIRATE  Final   Special Requests NONE  Final   Gram Stain   Final    ABUNDANT WBC PRESENT, PREDOMINANTLY PMN NO ORGANISMS SEEN Performed at [facility], [address], [place], [place] [address]    Culture FEW CANDIDA ALBICANS  Final   Report Status 10/24/2023 FINAL  Final    Time spent: 13 minutes  Signed: Zola LOISE Herter, MD 10/23/2023

## 2023-11-18 NOTE — Progress Notes (Signed)
SLP Cancellation Note  Patient Details Name: ANDREKA STUCKI MRN: [id number] DOB: November 20, 1945   Cancelled treatment:       Reason Eval/Treat Not Completed: Medical issues which prohibited therapy. Pt remains on vent as of this am. SLP to sign off. Please reorder as needed.     Leita SAILOR., M.A. CCC-SLP Acute Rehabilitation Services Office: [phone]  Secure chat preferred  10/21/2023, 8:15 AM

## 2023-11-18 NOTE — Progress Notes (Signed)
 Patient extubated to comfort care.  Tolerated well.

## 2023-11-18 DEATH — deceased

## 2023-11-23 LAB — FUNGUS CULTURE WITH STAIN

## 2023-11-23 LAB — FUNGUS CULTURE RESULT

## 2023-11-23 LAB — FUNGAL ORGANISM REFLEX

## 2023-12-04 LAB — ACID FAST CULTURE WITH REFLEXED SENSITIVITIES (MYCOBACTERIA): Acid Fast Culture: NEGATIVE
